# Patient Record
Sex: Male | Born: 1955 | Race: White | Hispanic: No | Smoking: Former smoker
Health system: Southern US, Community
[De-identification: ages and names within clinical notes are randomized; demographics above are authoritative.]

## PROBLEM LIST (undated history)

## (undated) DIAGNOSIS — M1990 Unspecified osteoarthritis, unspecified site: Secondary | ICD-10-CM

## (undated) DIAGNOSIS — F419 Anxiety disorder, unspecified: Secondary | ICD-10-CM

## (undated) DIAGNOSIS — I82409 Acute embolism and thrombosis of unspecified deep veins of unspecified lower extremity: Secondary | ICD-10-CM

## (undated) DIAGNOSIS — J439 Emphysema, unspecified: Secondary | ICD-10-CM

## (undated) DIAGNOSIS — J449 Chronic obstructive pulmonary disease, unspecified: Secondary | ICD-10-CM

## (undated) DIAGNOSIS — N189 Chronic kidney disease, unspecified: Secondary | ICD-10-CM

## (undated) DIAGNOSIS — K219 Gastro-esophageal reflux disease without esophagitis: Secondary | ICD-10-CM

## (undated) DIAGNOSIS — IMO0001 Reserved for inherently not codable concepts without codable children: Secondary | ICD-10-CM

## (undated) DIAGNOSIS — G4730 Sleep apnea, unspecified: Secondary | ICD-10-CM

## (undated) DIAGNOSIS — I509 Heart failure, unspecified: Secondary | ICD-10-CM

## (undated) DIAGNOSIS — F32A Depression, unspecified: Secondary | ICD-10-CM

## (undated) DIAGNOSIS — R569 Unspecified convulsions: Secondary | ICD-10-CM

## (undated) DIAGNOSIS — J45909 Unspecified asthma, uncomplicated: Secondary | ICD-10-CM

## (undated) DIAGNOSIS — M542 Cervicalgia: Secondary | ICD-10-CM

## (undated) DIAGNOSIS — M810 Age-related osteoporosis without current pathological fracture: Secondary | ICD-10-CM

## (undated) DIAGNOSIS — M5442 Lumbago with sciatica, left side: Secondary | ICD-10-CM

## (undated) DIAGNOSIS — F329 Major depressive disorder, single episode, unspecified: Secondary | ICD-10-CM

## (undated) DIAGNOSIS — Z87442 Personal history of urinary calculi: Secondary | ICD-10-CM

## (undated) DIAGNOSIS — I10 Essential (primary) hypertension: Secondary | ICD-10-CM

## (undated) DIAGNOSIS — G709 Myoneural disorder, unspecified: Secondary | ICD-10-CM

## (undated) HISTORY — PX: SPINE SURGERY: SHX786

## (undated) HISTORY — PX: FRACTURE SURGERY: SHX138

## (undated) HISTORY — PX: OTHER SURGICAL HISTORY: SHX169

## (undated) HISTORY — DX: Cervicalgia: M54.2

## (undated) HISTORY — DX: Acute embolism and thrombosis of unspecified deep veins of unspecified lower extremity: I82.409

## (undated) HISTORY — DX: Emphysema, unspecified: J43.9

## (undated) HISTORY — DX: Heart failure, unspecified: I50.9

## (undated) HISTORY — PX: NECK SURGERY: SHX720

## (undated) HISTORY — PX: JOINT REPLACEMENT: SHX530

## (undated) HISTORY — DX: Age-related osteoporosis without current pathological fracture: M81.0

## (undated) HISTORY — DX: Lumbago with sciatica, left side: M54.42

## (undated) HISTORY — DX: Chronic obstructive pulmonary disease, unspecified: J44.9

## (undated) HISTORY — DX: Unspecified asthma, uncomplicated: J45.909

---

## 2003-03-06 ENCOUNTER — Inpatient Hospital Stay (HOSPITAL_COMMUNITY): Admission: EM | Admit: 2003-03-06 | Discharge: 2003-03-09 | Payer: Self-pay | Admitting: Emergency Medicine

## 2003-06-01 ENCOUNTER — Inpatient Hospital Stay (HOSPITAL_COMMUNITY): Admission: RE | Admit: 2003-06-01 | Discharge: 2003-06-03 | Payer: Self-pay | Admitting: Specialist

## 2003-06-22 ENCOUNTER — Inpatient Hospital Stay (HOSPITAL_COMMUNITY): Admission: AD | Admit: 2003-06-22 | Discharge: 2003-06-25 | Payer: Self-pay | Admitting: Specialist

## 2003-08-29 ENCOUNTER — Encounter: Admission: RE | Admit: 2003-08-29 | Discharge: 2003-08-29 | Payer: Self-pay | Admitting: Infectious Diseases

## 2003-10-03 ENCOUNTER — Encounter: Admission: RE | Admit: 2003-10-03 | Discharge: 2003-10-03 | Payer: Self-pay | Admitting: Infectious Diseases

## 2003-11-19 ENCOUNTER — Ambulatory Visit (HOSPITAL_COMMUNITY): Admission: RE | Admit: 2003-11-19 | Discharge: 2003-11-19 | Payer: Self-pay | Admitting: Specialist

## 2003-12-12 ENCOUNTER — Encounter: Admission: RE | Admit: 2003-12-12 | Discharge: 2003-12-12 | Payer: Self-pay | Admitting: Infectious Diseases

## 2004-04-16 ENCOUNTER — Encounter: Admission: RE | Admit: 2004-04-16 | Discharge: 2004-04-16 | Payer: Self-pay | Admitting: Specialist

## 2007-05-02 ENCOUNTER — Ambulatory Visit (HOSPITAL_COMMUNITY): Admission: RE | Admit: 2007-05-02 | Discharge: 2007-05-02 | Payer: Self-pay | Admitting: *Deleted

## 2007-05-02 ENCOUNTER — Encounter (INDEPENDENT_AMBULATORY_CARE_PROVIDER_SITE_OTHER): Payer: Self-pay | Admitting: *Deleted

## 2008-07-09 ENCOUNTER — Ambulatory Visit (HOSPITAL_COMMUNITY): Admission: RE | Admit: 2008-07-09 | Discharge: 2008-07-09 | Payer: Self-pay | Admitting: *Deleted

## 2008-07-09 ENCOUNTER — Encounter (INDEPENDENT_AMBULATORY_CARE_PROVIDER_SITE_OTHER): Payer: Self-pay | Admitting: *Deleted

## 2010-09-09 NOTE — Op Note (Signed)
NAME:  Evan [name], Evan [name]               ACCOUNT NO.:  [id number]   MEDICAL RECORD NO.:  [id number]          PATIENT TYPE:  AMB   LOCATION:  ENDO                         FACILITY:  [facility]   PHYSICIAN:  Georgiana Spinner, M.D.    DATE OF BIRTH:  05/30/1955   DATE OF PROCEDURE:  05/02/2007  DATE OF DISCHARGE:                               OPERATIVE REPORT   PROCEDURE:  Upper endoscopy.   INDICATIONS:  GERD.   ANESTHESIA:  Fentanyl 75 mcg, Versed 8 mg.   PROCEDURE:  With the patient mildly sedated in the left lateral  decubitus position, the Pentax videoscopic endoscope was inserted in the  mouth and passed under direct vision through the esophagus which  appeared normal until we reached distal esophagus and he had the  beginning of a stricture and inflammatory changes probably related to  esophageal reflux.  They were photographed and biopsied.  We then  entered into the stomach.  Fundus, body, antrum appeared normal.  Duodenal bulb showed multiple small ulcerations, however, which were  photographed.  The second portion of the duodenum appeared normal.  From  this point the endoscope was slowly withdrawn taking circumferential  views of the duodenal mucosa until the endoscope had been pulled back in  the stomach, placed in retroflexion to view the stomach from below.  The  endoscope was then straightened and withdrawn taking circumferential  views of the remaining gastric and esophageal mucosa, stopping to biopsy  the antral area to rule out H. pylori.  The endoscope was withdrawn.  The patient's vital signs and pulse oximeter remained stable.  The  patient tolerated the procedure well without apparent complications.   FINDINGS:  Changes of esophagitis secondary to reflux.  Ulceration of  duodenum.  Biopsies taken to rule out H. pylori.   PLAN:  The patient needs to be on PPI if he is not already on one at  least once a day.  Will have the patient call me for results of biopsy  and follow  up with me as an outpatient.           ______________________________  Georgiana Spinner, M.D.     GMO/MEDQ  D:  05/02/2007  T:  05/02/2007  Job:  [id number]

## 2010-09-09 NOTE — Op Note (Signed)
NAME:  Evan [name], Evan [name]               ACCOUNT NO.:  [id number]   MEDICAL RECORD NO.:  [id number]          PATIENT TYPE:  AMB   LOCATION:  ENDO                         [facility]ACILITY:  [facility]   PHYSICIAN:  Georgiana Spinner, M.D.    DATE OF BIRTH:  [birth year]   DATE OF PROCEDURE:  07/09/2008  DATE OF DISCHARGE:                               OPERATIVE REPORT   PROCEDURE:  Upper endoscopy.   INDICATIONS:  Barrett's esophagus.   ANESTHESIA:  Fentanyl 75 mcg, Versed 5 mg.   DESCRIPTION OF PROCEDURE:  With the patient mildly sedated in the left  lateral decubitus position, the Pentax videoscopic endoscope was  inserted in the mouth and passed under direct vision through the  esophagus which appeared normal until we reached distal esophagus, and  there were changes that appeared to be Barrett's esophagus at the  squamocolumnar junction.  Photographs were taken and biopsies were  taken.  We entered into the stomach, fundus, body, and antrum.  The  duodenal bulb and second portion duodenum were visualized.  From this  point the endoscope was slowly withdrawn, taking circumferential views  of the duodenal mucosa until the endoscope had been pulled back into the  stomach, placed in retroflexion to view the stomach from below.  The  endoscope was straightened and withdrawn, taking circumferential views  of the remaining gastric and esophageal mucosa.  The patient's vital  signs and pulse oximeter remained stable.  The patient tolerated the  procedure well without apparent complications.   FINDINGS:  The patient was gagging at the end of the procedure so we  could not do any further biopsies, but what appeared to be Barrett's  esophagus was biopsied.  The patient had very lax lower esophageal  sphincter with ability to see up the esophagus on retroflexed view,  indicating laxity of the sphincter.   PLAN:  The patient will call me for results of biopsy and follow up with  me as an outpatient.      ______________________________  Georgiana Spinner, M.D.     GMO/MEDQ  D:  07/09/2008  T:  07/09/2008  Job:  [id number]
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
## 2010-09-12 NOTE — Discharge Summary (Signed)
NAME:  Evan Evan [name], Evan Evan [name]                         ACCOUNT NO.:  [id number]   MEDICAL RECORD NO.:  [id number]                   PATIENT TYPE:  INP   LOCATION:  5030                                 FACILITY:  [facility]   PHYSICIAN:  Kerrin Champagne, M.D.                DATE OF BIRTH:  April 02, 1956   DATE OF ADMISSION:  03/06/2003  DATE OF DISCHARGE:  03/09/2003                                 DISCHARGE SUMMARY   ADMISSION DIAGNOSES:  1. Left tibia/fibular fracture and proximal fibular fracture.  2. Left chronic hemiparesis, secondary to previous cervical spine injury.  3. Hypertension.  4. Benign prostatic hypertrophy.  5. Gastroesophageal reflux disease.   DISCHARGE DIAGNOSES:  1. Left tibia/fibular fracture and proximal fibular fracture.  2. Left chronic hemiparesis, secondary to previous cervical spine injury.  3. Hypertension.  4. Benign prostatic hypertrophy.  5. Gastroesophageal reflux disease.   PROCEDURE:  On March 06, 2003, the patient underwent closed reduction and  casting of the left tibia/fibular fracture as well as the left proximal  fibular fracture.  This was done under IV sedation.   CONSULTATIONS:  None.   BRIEF HISTORY:  The patient is Evan [name] who fell from Evan [name]  riding lawnmower and was struck by the lawnmower, injuring his left lower  extremity.  He was brought to [facility] Room where x-rays  revealed Evan [name] left tib/fib fracture, closed, as well as Evan [name] left proximal fibular  fracture.  He underwent closed reduction and casting in the emergency room  under local sedation.  Unfortunately, secondary to his left hemiparesis, he  required admission to the hospital for physical therapy as well as pain  control issues.   BRIEF HOSPITAL COURSE:  Upon admission, the patient utilized PCA analgesics  for his discomfort.  He had Evan [name] significant amount of nausea and did require  IV antiemetics.  Eventually, as his nausea was more controlled, his  medications  were changed to OxyContin and OxyIR.  He continued IV fluids  until the nausea resolved.  Physical Therapy assisted the patient with bed-  to-chair transfers.  It was felt that he would require bed-to-wheelchair  transfers only as he was unable to use the left upper extremity and will be  strict nonweightbearing on the left lower extremity.  Unfortunately, on the  following couple of days during the hospital stay, he continued to have  episodes of nausea and vomiting.  Eventually, he was able to have Evan [name] bowel  movement and this did help to decrease his nausea.  He was placed on  Prevacid for his reflux as well.  The patient was treated with handheld  nebulizers and his spirometry per mild fever postoperatively, which was felt  to be related to atelectasis.   During the hospital stay, he was able to learn bed-to-wheelchair transfers.  On March 09, 2003, his nausea had resolved.  The patient was having bowel  movements, was voiding well and was comfortable with oral analgesics.  He  was discharged to his home in stable condition.   LABORATORY DATA/STUDIES:  Pertinent laboratory values:  KUB on March 08, 2003, showed normal gas pattern.  Left lower extremity on November 9, showed  postreduction left tib/fib fracture.  Chest x-Lorie on March 06, 2003 showed  cardiomegaly with no rib fracture.  No other labs were done during the  hospital stay.   PLAN:  The patient was discharged to his home.  Arrangements were made for  home health physical therapy as well as occupational therapy.  He is bed-to-  wheelchair transfers only.  All equipment was made available as well.  This  would include Evan [name] hospital bed, wheelchair, bed commode and walker.   DISCHARGE MEDICATIONS:  Prescriptions were given for:  1. OxyContin 20 mg b.i.d.  2. OxyIR 1 q.4-6h. as needed for pain.  3. Robaxin 500 mg 1 q.8h. as needed for spasm.  4. Reglan 10 mg to take q.6h. p.r.n. nausea.  5. Aspirin over-the-counter and  use 325 mg daily.  6. Over-the-counter stool softener and laxatives as needed.   DISCHARGE INSTRUCTIONS:  He will continue with strict ice and elevation of  the left lower extremity.  He will followup with Kerrin Champagne, M.D. two  weeks from the date of his surgery.  If there are questions or concerns  prior to that time, he has been advised to call the office.   DISCHARGE CONDITION:  Stable.      [name], P.Evan [name].                    Kerrin Champagne, M.D.    SMV/MEDQ  D:  03/29/2003  T:  03/29/2003  Job:  [id number]

## 2012-02-04 ENCOUNTER — Other Ambulatory Visit: Payer: Self-pay | Admitting: Gastroenterology

## 2013-09-20 ENCOUNTER — Encounter (INDEPENDENT_AMBULATORY_CARE_PROVIDER_SITE_OTHER): Payer: Self-pay

## 2013-09-20 ENCOUNTER — Encounter: Payer: Self-pay | Admitting: Neurology

## 2013-09-20 ENCOUNTER — Ambulatory Visit (INDEPENDENT_AMBULATORY_CARE_PROVIDER_SITE_OTHER): Payer: Medicaid Other | Admitting: Neurology

## 2013-09-20 VITALS — BP 130/77 | HR 71 | Ht 69.0 in | Wt 176.0 lb

## 2013-09-20 DIAGNOSIS — M545 Low back pain, unspecified: Secondary | ICD-10-CM

## 2013-09-20 DIAGNOSIS — M542 Cervicalgia: Secondary | ICD-10-CM | POA: Insufficient documentation

## 2013-09-20 DIAGNOSIS — R404 Transient alteration of awareness: Secondary | ICD-10-CM

## 2013-09-20 MED ORDER — DULOXETINE HCL 60 MG PO CPEP: 60.0000 mg | ORAL_CAPSULE | Freq: Every day | ORAL | Status: DC

## 2013-09-20 MED ORDER — GABAPENTIN 300 MG PO CAPS: 300.0000 mg | ORAL_CAPSULE | Freq: Three times a day (TID) | ORAL | Status: DC
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
## 2013-09-28 ENCOUNTER — Ambulatory Visit
Admission: RE | Admit: 2013-09-28 | Discharge: 2013-09-28 | Disposition: A | Discharge status: Home / Self Care | Payer: Medicaid Other | Source: Ambulatory Visit | Attending: Neurology | Admitting: Neurology

## 2013-09-28 ENCOUNTER — Other Ambulatory Visit: Payer: Self-pay

## 2013-09-28 DIAGNOSIS — M542 Cervicalgia: Secondary | ICD-10-CM

## 2013-09-28 DIAGNOSIS — M545 Low back pain, unspecified: Secondary | ICD-10-CM

## 2013-09-28 DIAGNOSIS — R55 Syncope and collapse: Secondary | ICD-10-CM

## 2013-09-28 DIAGNOSIS — R404 Transient alteration of awareness: Secondary | ICD-10-CM

## 2013-09-28 MED ORDER — GADOBENATE DIMEGLUMINE 529 MG/ML IV SOLN
16.0000 mL | Freq: Once | INTRAVENOUS | Status: AC | PRN
  Administered 2013-09-28: 16 mL via INTRAVENOUS

## 2013-09-29 ENCOUNTER — Telehealth: Payer: Self-pay | Admitting: *Deleted

## 2013-09-29 ENCOUNTER — Ambulatory Visit (INDEPENDENT_AMBULATORY_CARE_PROVIDER_SITE_OTHER): Payer: Medicaid Other | Admitting: Radiology

## 2013-09-29 DIAGNOSIS — M545 Low back pain, unspecified: Secondary | ICD-10-CM

## 2013-09-29 DIAGNOSIS — M542 Cervicalgia: Secondary | ICD-10-CM

## 2013-09-29 DIAGNOSIS — R404 Transient alteration of awareness: Secondary | ICD-10-CM

## 2013-09-29 NOTE — Telephone Encounter (Signed)
Will address his questions in June 30 follow up
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
## 2013-10-02 ENCOUNTER — Telehealth: Payer: Self-pay | Admitting: Neurology

## 2013-10-02 NOTE — Telephone Encounter (Addendum)
Evan [name], please call patient, mild age related changes on MRI of brain. Degenerative disc disease on MRI cervical, will go over detail at follow up visit.

## 2013-10-05 NOTE — Telephone Encounter (Signed)
Spoke to patient and relayed MRI brain and cervical results, per Dr. Krista Blue.  Told him the doctor will go over in detail at his follow up visit.  Patient also would like a copy of results, I explained that at his appointment 10-24-13 to sign a release and we can get copies for him.

## 2013-10-13 ENCOUNTER — Telehealth: Payer: Self-pay | Admitting: Neurology

## 2013-10-13 DIAGNOSIS — M542 Cervicalgia: Secondary | ICD-10-CM

## 2013-10-13 DIAGNOSIS — M545 Low back pain, unspecified: Secondary | ICD-10-CM

## 2013-10-13 DIAGNOSIS — R404 Transient alteration of awareness: Secondary | ICD-10-CM

## 2013-10-13 NOTE — Telephone Encounter (Signed)
Patient's girlfriend called and stated MRI of back was denied by [facility].  Questioning if patient could have back X-Merrick.  Please call and advise
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
## 2013-10-17 NOTE — Telephone Encounter (Signed)
Spoke with patient's girlfriend and told her that Dr Krista Blue had placed the orders for the DG Lumbar, DG Cervical were placed on 06/22,she verbalized understanding

## 2013-10-18 ENCOUNTER — Ambulatory Visit
Admission: RE | Admit: 2013-10-18 | Discharge: 2013-10-18 | Disposition: A | Discharge status: Home / Self Care | Payer: Medicaid Other | Source: Ambulatory Visit | Attending: Neurology | Admitting: Neurology

## 2013-10-18 DIAGNOSIS — R404 Transient alteration of awareness: Secondary | ICD-10-CM

## 2013-10-18 DIAGNOSIS — M545 Low back pain, unspecified: Secondary | ICD-10-CM

## 2013-10-18 DIAGNOSIS — M542 Cervicalgia: Secondary | ICD-10-CM

## 2013-10-20 ENCOUNTER — Telehealth: Payer: Self-pay | Admitting: Neurology

## 2013-10-20 NOTE — Telephone Encounter (Addendum)
I will go over result at follow up visit  X-Evan [name] of cervical showed  Negative for fracture or other acute bone abnormality. Solid interbody fusion C4-C6 with degenerative disc disease at other cervical levels as above.  Xray lumbar Mild lumbar spine spondylosis at L4-5 and L5-S1.

## 2013-10-23 NOTE — Telephone Encounter (Signed)
Left message with Xray lumbar showing mild lumbar spine spondylosis at L4-5 and L5-S1, per Dr. Krista Blue.  Relayed that she will go over these results tomorrow at his appointment.

## 2013-10-24 ENCOUNTER — Encounter (INDEPENDENT_AMBULATORY_CARE_PROVIDER_SITE_OTHER): Payer: Self-pay

## 2013-10-24 ENCOUNTER — Encounter: Payer: Self-pay | Admitting: Neurology

## 2013-10-24 ENCOUNTER — Ambulatory Visit (INDEPENDENT_AMBULATORY_CARE_PROVIDER_SITE_OTHER): Payer: Medicaid Other | Admitting: Neurology

## 2013-10-24 VITALS — BP 126/80 | HR 67 | Ht 69.0 in | Wt 180.0 lb

## 2013-10-24 DIAGNOSIS — M542 Cervicalgia: Secondary | ICD-10-CM

## 2013-10-24 DIAGNOSIS — M545 Low back pain, unspecified: Secondary | ICD-10-CM

## 2013-10-24 MED ORDER — GABAPENTIN 300 MG PO CAPS
600.0000 mg | ORAL_CAPSULE | Freq: Three times a day (TID) | ORAL | Status: DC
Start: 2013-10-24 — End: 2014-11-27
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
## 2013-11-02 ENCOUNTER — Ambulatory Visit
Admission: RE | Admit: 2013-11-02 | Discharge: 2013-11-02 | Disposition: A | Discharge status: Home / Self Care | Payer: Medicaid Other | Source: Ambulatory Visit | Attending: Neurology | Admitting: Neurology

## 2013-11-02 DIAGNOSIS — M545 Low back pain, unspecified: Secondary | ICD-10-CM

## 2013-11-02 DIAGNOSIS — M542 Cervicalgia: Secondary | ICD-10-CM

## 2013-11-02 DIAGNOSIS — M549 Dorsalgia, unspecified: Secondary | ICD-10-CM

## 2013-11-03 ENCOUNTER — Telehealth: Payer: Self-pay | Admitting: Neurology

## 2013-11-03 DIAGNOSIS — M545 Low back pain, unspecified: Secondary | ICD-10-CM

## 2013-11-03 NOTE — Telephone Encounter (Signed)
I have called him  MRI of the lumbar spine showing prominent spondylitic changes at L4-5 and there is left foraminal disc protrusion resulting in foraminal narrowing as well as right lateral disc osteophyte protrusion and L5-S1 resulting in moderate foraminal narrowing as well.  EMG/NCS is ordered.

## 2013-11-28 ENCOUNTER — Ambulatory Visit (INDEPENDENT_AMBULATORY_CARE_PROVIDER_SITE_OTHER): Payer: Medicaid Other | Admitting: Neurology

## 2013-11-28 ENCOUNTER — Encounter (INDEPENDENT_AMBULATORY_CARE_PROVIDER_SITE_OTHER): Payer: Self-pay

## 2013-11-28 DIAGNOSIS — Z0289 Encounter for other administrative examinations: Secondary | ICD-10-CM

## 2013-11-28 DIAGNOSIS — M545 Low back pain, unspecified: Secondary | ICD-10-CM

## 2013-11-28 NOTE — Addendum Note (Signed)
Addended by: Marcial Pacas on: 11/28/2013 12:34 PM   Modules accepted: Level of Service
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
## 2014-01-24 ENCOUNTER — Encounter (INDEPENDENT_AMBULATORY_CARE_PROVIDER_SITE_OTHER): Payer: Self-pay

## 2014-01-24 ENCOUNTER — Ambulatory Visit (INDEPENDENT_AMBULATORY_CARE_PROVIDER_SITE_OTHER): Payer: Medicaid Other | Admitting: Neurology

## 2014-01-24 ENCOUNTER — Encounter: Payer: Self-pay | Admitting: Neurology

## 2014-01-24 VITALS — BP 142/91 | HR 69 | Ht 69.0 in | Wt 192.0 lb

## 2014-01-24 DIAGNOSIS — M5442 Lumbago with sciatica, left side: Secondary | ICD-10-CM | POA: Insufficient documentation

## 2014-01-24 DIAGNOSIS — M5430 Sciatica, unspecified side: Secondary | ICD-10-CM
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
## 2014-07-03 ENCOUNTER — Telehealth: Payer: Self-pay | Admitting: Neurology

## 2014-07-03 NOTE — Telephone Encounter (Signed)
Low back pain has worsened - radiating down right leg into heel.  He is rating pain 9-10 on pain scale. Using hydrocodone daily.

## 2014-07-03 NOTE — Telephone Encounter (Signed)
Pt is calling stating he has some new symptoms of pain going from his back to down his leg and it's become unbearable. Not sure what he needs to do. Please call and advise.

## 2014-07-03 NOTE — Telephone Encounter (Signed)
Called and spoke to patient and he is coming to see Megan 07-04-14.

## 2014-07-03 NOTE — Telephone Encounter (Signed)
Ok per Dr. Krista Blue and Jinny Blossom to be placed on schedule.  Hinton Dyer will call to schedule.

## 2014-07-04 ENCOUNTER — Ambulatory Visit (INDEPENDENT_AMBULATORY_CARE_PROVIDER_SITE_OTHER): Payer: Medicaid Other | Admitting: Adult Health

## 2014-07-04 ENCOUNTER — Encounter: Payer: Self-pay | Admitting: Adult Health

## 2014-07-04 VITALS — BP 159/96 | HR 58 | Ht 69.0 in | Wt 202.0 lb

## 2014-07-04 DIAGNOSIS — F329 Major depressive disorder, single episode, unspecified: Secondary | ICD-10-CM | POA: Diagnosis not present

## 2014-07-04 DIAGNOSIS — F32A Depression, unspecified: Secondary | ICD-10-CM

## 2014-07-04 DIAGNOSIS — M5441 Lumbago with sciatica, right side: Secondary | ICD-10-CM | POA: Diagnosis not present

## 2014-07-04 MED ORDER — CELECOXIB 50 MG PO CAPS: 50.0000 mg | ORAL_CAPSULE | Freq: Two times a day (BID) | ORAL | Status: DC
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
## 2014-07-04 NOTE — Patient Instructions (Signed)
Start Celebrex 50 mg twice a day I will refer you to pain clinc.

## 2014-07-05 NOTE — Progress Notes (Signed)
I have reviewed and agreed above plan. 

## 2014-07-09 ENCOUNTER — Ambulatory Visit: Payer: Medicaid Other | Admitting: Neurology

## 2014-11-09 ENCOUNTER — Ambulatory Visit: Payer: Medicaid Other | Admitting: Adult Health

## 2014-11-12 ENCOUNTER — Encounter: Payer: Self-pay | Admitting: Adult Health

## 2014-11-12 ENCOUNTER — Ambulatory Visit (INDEPENDENT_AMBULATORY_CARE_PROVIDER_SITE_OTHER): Payer: Medicaid Other | Admitting: Adult Health

## 2014-11-12 VITALS — BP 167/100 | HR 65 | Ht 69.0 in | Wt 205.0 lb

## 2014-11-12 DIAGNOSIS — G8929 Other chronic pain: Secondary | ICD-10-CM

## 2014-11-12 DIAGNOSIS — M549 Dorsalgia, unspecified: Secondary | ICD-10-CM | POA: Diagnosis not present

## 2014-11-12 NOTE — Progress Notes (Signed)
PATIENT: Evan [name] DOB: [birth year]  REASON FOR VISIT: follow up- chronic back pain HISTORY FROM: patient  HISTORY OF PRESENT ILLNESS: Evan [name] is a 59 year old male with a history of right-sided back pain radiating down the right leg. He returns today for follow-up. The patient states that he was on Celebrex and Dr. Ace Gins changed him to indomethacin. He states that his primary care then took him off indomethacin because his blood pressure had increased. He states that Dr. Ace Gins has done epidural steroid injections. He states that this has given him the most relief. He states after the injections he was able to walk and complete yardwork with minimal pain. He states that since he's been taken off indomethacin he's noticed increased pain in the joints however he continues to have minimal pain in the back that radiates to the leg. He states that he has the most trouble with going from a sitting to standing position or climbing up in his truck. He states that last week he used a cane in order to prevent falls. He is not using a cane today. He denies any new neurological issues. He returns today for follow-up.  HISTORY 07/04/14: Evan [name] is a 59 year old male with a history of right-sided back pain that radiates down the leg. He returns today complaining of severe back pain. He describes his pain as a burning tingling sensation that travels down the right leg. He also describes it as a "hot grease" sensation. He said the cold weather will trigger his back pain. He denies any changes with his ambulation however he states that he does walk with a limp due to the pain in the leg. The patient has had ongoing falls since his initial injury. The patient has not been in physical therapy for several years due to finances. The patient denies any changes with his bowels or bladder. He does report some occasional constipation. The patient uses gabapentin 600 mg twice a day. He also was prescribed  hydrocodone by Dr. Rex Kras and reports that this helps with the pain some. 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 He states that he normally takes a chair with him and tries to take frequent breaks. He states that this week he was working on a chicken pin and afterwards her to experience numbness in the fingertips on the left hand. Patient has had ongoing issues with pain and sensory changes in the left arm after his injury. The patient is experiencing some depression. He states that he is in the process for filing for disability but has been denied in the past. He states he is on a very limited income and is unable to work full-time due to his injuries. He states that this causes added stress and depression. He also states that being in pain and see in himself decline over the years also causes depression. He is currently on Prozac. He returns today for an evaluation.  REVIEW OF SYSTEMS: Out of a complete 14 system review of symptoms, the patient complains only of the following symptoms, and all other reviewed systems are negative.  Fatigue, weakness  ALLERGIES: Allergies  Allergen Reactions  . Morphine And Related Nausea And Vomiting    HOME MEDICATIONS: Outpatient Prescriptions Prior to Visit  Medication Sig Dispense Refill  . ALPRAZolam (XANAX) 0.5 MG tablet Take 0.5 mg by mouth at bedtime as needed for anxiety.    Marland Kitchen esomeprazole (NEXIUM) 40 MG capsule Take 40 mg by mouth daily at 12 noon.    Marland Kitchen  FLUoxetine (PROZAC) 20 MG capsule Take 20 mg by mouth daily.    Marland Kitchen gabapentin (NEURONTIN) 300 MG capsule Take 2 capsules (600 mg total) by mouth 3 (three) times daily. 180 capsule 12  . HYDROcodone-acetaminophen (NORCO/VICODIN) 5-325 MG per tablet Take 1 tablet by mouth every 6 (six) hours as needed for moderate pain.    Marland Kitchen amLODipine (NORVASC) 5 MG tablet Take 5 mg by mouth daily.    . celecoxib (CELEBREX) 50 MG capsule Take 1 capsule (50 mg total) by mouth 2 (two) times daily. 60 capsule 0    . SULINDAC PO Take by mouth at bedtime as needed and may repeat dose one time if needed.     No facility-administered medications prior to visit.    PAST MEDICAL HISTORY: Past Medical History  Diagnosis Date  . Neck pain   . Right-sided low back pain with left-sided sciatica     PAST SURGICAL HISTORY: Past Surgical History  Procedure Laterality Date  . Neck surgery      4-5--6 and 7  . Left leg    . Kidney stones      FAMILY HISTORY: Family History  Problem Relation Age of Onset  . Hepatitis Mother   . Alzheimer's disease Father     SOCIAL HISTORY: History   Social History  . Marital Status: Married    Spouse Name: N/A  . Number of Children: 2  . Years of Education: 12   Occupational History  . Not on file.   Social History Main Topics  . Smoking status: Current Every Day Smoker  . Smokeless tobacco: Never Used  . Alcohol Use: 0.6 oz/week    1 Shots of liquor per week     Comment: One daily   . Drug Use: No  . Sexual Activity: Not on file   Other Topics Concern  . Not on file   Social History Narrative   Patient is not working he is trying to get disability   Patient is single    Education 12 th grade    Right handed.   Caffeine three cups daily.                  PHYSICAL EXAM  Filed Vitals:   11/12/14 1544  BP: 167/100  Pulse: 65  Height: 5\' 9"  (1.753 m)  Weight: 205 lb (92.987 kg)   Body mass index is 30.26 kg/(m^2).  Generalized: Well developed, in no acute distress   Neurological examination  Mentation: Alert oriented to time, place, history taking. Follows all commands speech and language fluent Cranial nerve II-XII: Pupils were equal round reactive to light. Extraocular movements were full, visual field were full on confrontational test. Facial sensation and strength were normal. Uvula tongue midline. Head turning and shoulder shrug  were normal and symmetric. Motor: The motor testing reveals 5 over 5 strength in the right  upper and lower extremity. 4 over 5 strength in the left upper and lower extremity. This is due to a prior injury. Good symmetric motor tone is noted throughout.  Sensory: Sensory testing is intact to soft touch on all 4 extremities. No evidence of extinction is noted.  Coordination: Cerebellar testing reveals good finger-nose-finger and heel-to-shin bilaterally.  Gait and station: Patient has a slight limp on the left due to a prior injury. Tandem gait is slightly unsteady. Romberg is negative. No drift is seen.  Reflexes: Deep tendon reflexes are symmetric and normal in the upper extremities. Hyperreflexic in the left lower  extremity.  DIAGNOSTIC DATA (LABS, IMAGING, TESTING) - I reviewed patient records, labs, notes, testing and imaging myself where available.     ASSESSMENT AND PLAN 59 y.o. year old male  has a past medical history of Neck pain and Right-sided low back pain with left-sided sciatica. here with:  1. Chronic back pain  The patient's back pain has improved significantly with epidural steroid injections. He was recently taken off of indomethacin and has noticed an increase in joint related pains. Patient is unsure when his next appointment with Dr. Ace Gins is. I have encouraged the patient to follow-up with Dr. Ace Gins in regards to his pain. Patient verbalized understanding. If his symptoms worsen or he develops new symptoms he should let us know. Otherwise he will follow-up in 6 months with Dr. Krista Blue.      Ward Givens, MSN, NP-C 11/12/2014, 4:22 PM [facility] [address] [place], [place] [address] [phone]  Note: This document was prepared with digital dictation and possible smart phrase technology. Any transcriptional errors that result from this process are unintentional.

## 2014-11-12 NOTE — Patient Instructions (Signed)
Follow-up with Dr. Ace Gins

## 2014-11-12 NOTE — Progress Notes (Signed)
I have read the note, and I agree with the clinical assessment and plan.  Amish Mintzer KEITH   

## 2014-11-27 ENCOUNTER — Other Ambulatory Visit: Payer: Self-pay | Admitting: Neurology

## 2014-11-27 NOTE — Telephone Encounter (Signed)
OV note says: He will continue taking gabapentin 600 mg twice a day

## 2015-01-01 ENCOUNTER — Other Ambulatory Visit: Payer: Self-pay | Admitting: Neurology

## 2015-03-01 DIAGNOSIS — M5136 Other intervertebral disc degeneration, lumbar region: Secondary | ICD-10-CM | POA: Insufficient documentation

## 2015-03-01 DIAGNOSIS — M51369 Other intervertebral disc degeneration, lumbar region without mention of lumbar back pain or lower extremity pain: Secondary | ICD-10-CM | POA: Insufficient documentation

## 2015-03-01 DIAGNOSIS — M47816 Spondylosis without myelopathy or radiculopathy, lumbar region: Secondary | ICD-10-CM | POA: Insufficient documentation

## 2015-04-25 ENCOUNTER — Ambulatory Visit (INDEPENDENT_AMBULATORY_CARE_PROVIDER_SITE_OTHER): Payer: Self-pay | Admitting: Neurology

## 2015-04-25 ENCOUNTER — Ambulatory Visit (INDEPENDENT_AMBULATORY_CARE_PROVIDER_SITE_OTHER): Payer: Medicaid Other | Admitting: Neurology

## 2015-04-25 DIAGNOSIS — M5442 Lumbago with sciatica, left side: Secondary | ICD-10-CM

## 2015-04-25 DIAGNOSIS — M545 Low back pain, unspecified: Secondary | ICD-10-CM

## 2015-04-25 DIAGNOSIS — Z0289 Encounter for other administrative examinations: Secondary | ICD-10-CM

## 2015-04-25 NOTE — Procedures (Signed)
   NCS (NERVE CONDUCTION STUDY) WITH EMG (ELECTROMYOGRAPHY) REPORT   STUDY DATE: April 25 2015 PATIENT NAME: Evan [name] DOB: 1955-09-07 MRN: [id number]    TECHNOLOGIST: Laretta Alstrom ELECTROMYOGRAPHER: Marcial Pacas M.D.  CLINICAL INFORMATION:  59 years old gentleman with chronic low back pain, radiating pain to right lower extremity  FINDINGS: NERVE CONDUCTION STUDY: Bilateral sural sensory responses were normal. Bilateral peroneal to EDB and tibial motor responses were normal. Bilateral tibial H reflexes were normal and symmetric.  NEEDLE ELECTROMYOGRAPHY: Selected needle examination was performed at right lower extremity muscles right lumbosacral paraspinal muscles.  Needle examination of right tibialis anterior, tibialis posterior, peroneal longus, vastus lateralis, gluteus medius was normal.  There was no spontaneous activity at right lumbosacral paraspinal muscles, right L4-5 S1.  IMPRESSION:   This is a normal study. There is no electrodiagnostic evidence of large fiber peripheral neuropathy or right lumbosacral radiculopathy.  INTERPRETING PHYSICIAN:   Marcial Pacas M.D. Ph.D. [facility] [address] [place], [place] [address] [phone]

## 2015-04-25 NOTE — Progress Notes (Signed)
Electrodiagnostic study is normal. There is no electrodiagnostic evidence of large fiber peripheral neuropathy or right lumbosacral radiculopathy.

## 2015-05-03 ENCOUNTER — Ambulatory Visit
Admission: RE | Admit: 2015-05-03 | Discharge: 2015-05-03 | Disposition: A | Discharge status: Home / Self Care | Payer: Medicaid Other | Source: Ambulatory Visit | Attending: Physical Medicine and Rehabilitation | Admitting: Physical Medicine and Rehabilitation

## 2015-05-03 ENCOUNTER — Other Ambulatory Visit: Payer: Self-pay | Admitting: Physical Medicine and Rehabilitation

## 2015-05-03 DIAGNOSIS — G894 Chronic pain syndrome: Secondary | ICD-10-CM

## 2015-05-03 DIAGNOSIS — M160 Bilateral primary osteoarthritis of hip: Secondary | ICD-10-CM

## 2015-05-07 ENCOUNTER — Other Ambulatory Visit: Payer: Self-pay | Admitting: Physical Medicine and Rehabilitation

## 2015-05-07 DIAGNOSIS — R269 Unspecified abnormalities of gait and mobility: Secondary | ICD-10-CM

## 2015-05-07 DIAGNOSIS — M25551 Pain in right hip: Secondary | ICD-10-CM

## 2015-05-13 ENCOUNTER — Other Ambulatory Visit: Payer: Medicaid Other

## 2015-05-15 ENCOUNTER — Ambulatory Visit: Payer: Medicaid Other | Admitting: Neurology

## 2015-05-16 ENCOUNTER — Ambulatory Visit
Admission: RE | Admit: 2015-05-16 | Discharge: 2015-05-16 | Disposition: A | Discharge status: Home / Self Care | Payer: Medicaid Other | Source: Ambulatory Visit | Attending: Physical Medicine and Rehabilitation | Admitting: Physical Medicine and Rehabilitation

## 2015-05-16 DIAGNOSIS — R269 Unspecified abnormalities of gait and mobility: Secondary | ICD-10-CM

## 2015-05-16 DIAGNOSIS — M25551 Pain in right hip: Secondary | ICD-10-CM

## 2015-07-02 ENCOUNTER — Other Ambulatory Visit: Payer: Self-pay | Admitting: Orthopaedic Surgery

## 2015-07-03 ENCOUNTER — Other Ambulatory Visit: Payer: Self-pay | Admitting: Physician Assistant

## 2015-07-04 ENCOUNTER — Other Ambulatory Visit: Payer: Self-pay | Admitting: Orthopaedic Surgery

## 2015-07-04 NOTE — Pre-Procedure Instructions (Signed)
    Emani A Koogler  07/04/2015      [facility] - [place] - [address] [address] [place] [address] Phone: [phone] Fax: [phone]    Your procedure is scheduled on Tuesday, March 14th, 2017.   Report to [facility] Admitting at 11:15 A.M.   Call this number if you have problems the morning of surgery:  [phone]   Remember:  Do not eat food or drink liquids after midnight.   Take these medicines the morning of surgery with A SIP OF WATER: Alprazolam (Zanax) if needed, Esomeprazole (Nexium), Fluoxetine (Prozac), Gabapentin (Neurontin), Hydrocodone-acetaminophen (Norco/Vicodin), Meclizine if needed, Metoprolol Succinate (Toprol-XL), Prednisone (Deltasone).  Stop taking: Aspirin, NSAIDS, Aleve, Naproxen, Ibuprofen, Advil, Motrin, BC's, Goody's, Fish oil, all herbal medications, and all vitamins.    Do not wear jewelry.  Do not wear lotions, powders, or colognes.    Men may shave face and neck .  Do not bring valuables to the hospital.   [facility] is not responsible for any belongings or valuables.  Contacts, dentures or bridgework may not be worn into surgery.  Leave your suitcase in the car.  After surgery it may be brought to your room.  For patients admitted to the hospital, discharge time will be determined by your treatment team.  Patients discharged the day of surgery will not be allowed to drive home.   Special instructions:  See attached.   Please read over the following fact sheets that you were given. Pain Booklet, Coughing and Deep Breathing, Total Joint Packet, MRSA Information and Surgical Site Infection Prevention

## 2015-07-05 ENCOUNTER — Encounter (HOSPITAL_COMMUNITY)
Admission: RE | Admit: 2015-07-05 | Discharge: 2015-07-05 | Disposition: A | Discharge status: Home / Self Care | Payer: Medicaid Other | Source: Ambulatory Visit | Attending: Orthopaedic Surgery | Admitting: Orthopaedic Surgery

## 2015-07-05 ENCOUNTER — Encounter (HOSPITAL_COMMUNITY): Payer: Self-pay

## 2015-07-05 DIAGNOSIS — Z01812 Encounter for preprocedural laboratory examination: Secondary | ICD-10-CM | POA: Insufficient documentation

## 2015-07-05 DIAGNOSIS — Z0183 Encounter for blood typing: Secondary | ICD-10-CM | POA: Insufficient documentation

## 2015-07-05 DIAGNOSIS — R001 Bradycardia, unspecified: Secondary | ICD-10-CM | POA: Diagnosis not present

## 2015-07-05 DIAGNOSIS — M1611 Unilateral primary osteoarthritis, right hip: Secondary | ICD-10-CM | POA: Diagnosis not present

## 2015-07-05 HISTORY — DX: Gastro-esophageal reflux disease without esophagitis: K21.9

## 2015-07-05 HISTORY — DX: Depression, unspecified: F32.A

## 2015-07-05 HISTORY — DX: Unspecified osteoarthritis, unspecified site: M19.90

## 2015-07-05 HISTORY — DX: Sleep apnea, unspecified: G47.30

## 2015-07-05 HISTORY — DX: Major depressive disorder, single episode, unspecified: F32.9

## 2015-07-05 HISTORY — DX: Reserved for inherently not codable concepts without codable children: IMO0001

## 2015-07-05 HISTORY — DX: Unspecified convulsions: R56.9

## 2015-07-05 HISTORY — DX: Myoneural disorder, unspecified: G70.9

## 2015-07-05 HISTORY — DX: Chronic kidney disease, unspecified: N18.9

## 2015-07-05 HISTORY — DX: Anxiety disorder, unspecified: F41.9

## 2015-07-05 HISTORY — DX: Essential (primary) hypertension: I10

## 2015-07-05 LAB — CBC
HEMATOCRIT: 45.8 % (ref 39.0–52.0)
Hemoglobin: 14.6 g/dL (ref 13.0–17.0)
MCH: 28.2 pg (ref 26.0–34.0)
MCHC: 31.9 g/dL (ref 30.0–36.0)
MCV: 88.4 fL (ref 78.0–100.0)
Platelets: 195 10*3/uL (ref 150–400)
RBC: 5.18 MIL/uL (ref 4.22–5.81)
RDW: 13.8 % (ref 11.5–15.5)
WBC: 7.7 10*3/uL (ref 4.0–10.5)

## 2015-07-05 LAB — SURGICAL PCR SCREEN
MRSA, PCR: NEGATIVE
STAPHYLOCOCCUS AUREUS: NEGATIVE

## 2015-07-05 LAB — BASIC METABOLIC PANEL
Anion gap: 9 (ref 5–15)
BUN: 11 mg/dL (ref 6–20)
CHLORIDE: 106 mmol/L (ref 101–111)
CO2: 25 mmol/L (ref 22–32)
Calcium: 9.3 mg/dL (ref 8.9–10.3)
Creatinine, Ser: 1.17 mg/dL (ref 0.61–1.24)
GFR calc Af Amer: >60 mL/min (ref >60)
GFR calc non Af Amer: >60 mL/min (ref >60)
GLUCOSE: 99 mg/dL (ref 65–99)
POTASSIUM: 4.1 mmol/L (ref 3.5–5.1)
Sodium: 140 mmol/L (ref 135–145)

## 2015-07-08 MED ORDER — CEFAZOLIN SODIUM-DEXTROSE 2-3 GM-% IV SOLR
2.0000 g | INTRAVENOUS | Status: AC
  Administered 2015-07-09: 2 g via INTRAVENOUS
  Filled 2015-07-08: qty 50

## 2015-07-09 ENCOUNTER — Encounter (HOSPITAL_COMMUNITY): Payer: Self-pay | Admitting: Anesthesiology

## 2015-07-09 ENCOUNTER — Inpatient Hospital Stay (HOSPITAL_COMMUNITY): Payer: Medicaid Other

## 2015-07-09 ENCOUNTER — Inpatient Hospital Stay (HOSPITAL_COMMUNITY)
Admission: RE | Admit: 2015-07-09 | Discharge: 2015-07-11 | DRG: 470 | Disposition: A | Discharge status: Home Health | Payer: Medicaid Other | Source: Ambulatory Visit | Attending: Orthopaedic Surgery | Admitting: Orthopaedic Surgery

## 2015-07-09 ENCOUNTER — Inpatient Hospital Stay (HOSPITAL_COMMUNITY): Payer: Medicaid Other | Admitting: Anesthesiology

## 2015-07-09 ENCOUNTER — Encounter (HOSPITAL_COMMUNITY)
Admission: RE | Disposition: A | Discharge status: Home Health | Payer: Self-pay | Source: Ambulatory Visit | Attending: Orthopaedic Surgery

## 2015-07-09 DIAGNOSIS — I129 Hypertensive chronic kidney disease with stage 1 through stage 4 chronic kidney disease, or unspecified chronic kidney disease: Secondary | ICD-10-CM | POA: Diagnosis present

## 2015-07-09 DIAGNOSIS — F17210 Nicotine dependence, cigarettes, uncomplicated: Secondary | ICD-10-CM | POA: Diagnosis present

## 2015-07-09 DIAGNOSIS — G4730 Sleep apnea, unspecified: Secondary | ICD-10-CM | POA: Diagnosis present

## 2015-07-09 DIAGNOSIS — K219 Gastro-esophageal reflux disease without esophagitis: Secondary | ICD-10-CM | POA: Diagnosis present

## 2015-07-09 DIAGNOSIS — M545 Low back pain: Secondary | ICD-10-CM | POA: Diagnosis present

## 2015-07-09 DIAGNOSIS — Z820 Family history of epilepsy and other diseases of the nervous system: Secondary | ICD-10-CM | POA: Diagnosis not present

## 2015-07-09 DIAGNOSIS — N189 Chronic kidney disease, unspecified: Secondary | ICD-10-CM | POA: Diagnosis present

## 2015-07-09 DIAGNOSIS — F419 Anxiety disorder, unspecified: Secondary | ICD-10-CM | POA: Diagnosis present

## 2015-07-09 DIAGNOSIS — M1611 Unilateral primary osteoarthritis, right hip: Secondary | ICD-10-CM | POA: Diagnosis present

## 2015-07-09 DIAGNOSIS — F329 Major depressive disorder, single episode, unspecified: Secondary | ICD-10-CM | POA: Diagnosis present

## 2015-07-09 DIAGNOSIS — E669 Obesity, unspecified: Secondary | ICD-10-CM | POA: Diagnosis present

## 2015-07-09 DIAGNOSIS — Z6830 Body mass index (BMI) 30.0-30.9, adult: Secondary | ICD-10-CM

## 2015-07-09 DIAGNOSIS — M25551 Pain in right hip: Secondary | ICD-10-CM | POA: Diagnosis present

## 2015-07-09 DIAGNOSIS — Z87442 Personal history of urinary calculi: Secondary | ICD-10-CM

## 2015-07-09 DIAGNOSIS — Z419 Encounter for procedure for purposes other than remedying health state, unspecified: Secondary | ICD-10-CM

## 2015-07-09 DIAGNOSIS — Z96641 Presence of right artificial hip joint: Secondary | ICD-10-CM

## 2015-07-09 HISTORY — PX: TOTAL HIP ARTHROPLASTY: SHX124

## 2015-07-09 SURGERY — ARTHROPLASTY, HIP, TOTAL, ANTERIOR APPROACH
Anesthesia: Spinal | Site: Hip | Laterality: Right

## 2015-07-09 MED ORDER — FLUOXETINE HCL 10 MG PO CAPS
10.0000 mg | ORAL_CAPSULE | Freq: Every day | ORAL | Status: DC
  Administered 2015-07-09 – 2015-07-11 (×3): 10 mg via ORAL
  Filled 2015-07-09 (×5): qty 1

## 2015-07-09 MED ORDER — SODIUM CHLORIDE 0.9 % IV SOLN
INTRAVENOUS | Status: DC
  Administered 2015-07-09: 18:00:00 via INTRAVENOUS

## 2015-07-09 MED ORDER — ONDANSETRON HCL 4 MG/2ML IJ SOLN
INTRAMUSCULAR | Status: DC | PRN
  Administered 2015-07-09: 4 mg via INTRAVENOUS

## 2015-07-09 MED ORDER — ONDANSETRON HCL 4 MG/2ML IJ SOLN
4.0000 mg | Freq: Four times a day (QID) | INTRAMUSCULAR | Status: DC | PRN
  Administered 2015-07-10: 4 mg via INTRAVENOUS
  Filled 2015-07-09: qty 2

## 2015-07-09 MED ORDER — ACETAMINOPHEN 325 MG PO TABS
650.0000 mg | ORAL_TABLET | Freq: Four times a day (QID) | ORAL | Status: DC | PRN
Start: 2015-07-09 — End: 2015-07-11
  Administered 2015-07-10 – 2015-07-11 (×2): 650 mg via ORAL
  Filled 2015-07-09 (×2): qty 2

## 2015-07-09 MED ORDER — GABAPENTIN 300 MG PO CAPS
600.0000 mg | ORAL_CAPSULE | Freq: Two times a day (BID) | ORAL | Status: DC
  Administered 2015-07-09 – 2015-07-11 (×4): 600 mg via ORAL
  Filled 2015-07-09 (×4): qty 2

## 2015-07-09 MED ORDER — MIDAZOLAM HCL 2 MG/2ML IJ SOLN
INTRAMUSCULAR | Status: AC
  Filled 2015-07-09: qty 2

## 2015-07-09 MED ORDER — SODIUM CHLORIDE 0.9 % IR SOLN
Status: DC | PRN
  Administered 2015-07-09: 1000 mL

## 2015-07-09 MED ORDER — ACETAMINOPHEN 650 MG RE SUPP: 650.0000 mg | Freq: Four times a day (QID) | RECTAL | Status: DC | PRN

## 2015-07-09 MED ORDER — METOPROLOL SUCCINATE ER 25 MG PO TB24
25.0000 mg | ORAL_TABLET | Freq: Every day | ORAL | Status: DC
  Administered 2015-07-10 – 2015-07-11 (×2): 25 mg via ORAL
  Filled 2015-07-09 (×3): qty 1

## 2015-07-09 MED ORDER — ONDANSETRON HCL 4 MG PO TABS: 4.0000 mg | ORAL_TABLET | Freq: Four times a day (QID) | ORAL | Status: DC | PRN

## 2015-07-09 MED ORDER — METOPROLOL SUCCINATE ER 25 MG PO TB24
25.0000 mg | ORAL_TABLET | Freq: Every day | ORAL | Status: DC
  Filled 2015-07-09: qty 1

## 2015-07-09 MED ORDER — ALBUMIN HUMAN 5 % IV SOLN
INTRAVENOUS | Status: DC | PRN
  Administered 2015-07-09: 15:00:00 via INTRAVENOUS

## 2015-07-09 MED ORDER — ALUM & MAG HYDROXIDE-SIMETH 200-200-20 MG/5ML PO SUSP: 30.0000 mL | ORAL | Status: DC | PRN

## 2015-07-09 MED ORDER — PROPOFOL 10 MG/ML IV BOLUS
INTRAVENOUS | Status: DC | PRN
  Administered 2015-07-09: 100 mg via INTRAVENOUS

## 2015-07-09 MED ORDER — PHENOL 1.4 % MT LIQD: 1.0000 | OROMUCOSAL | Status: DC | PRN

## 2015-07-09 MED ORDER — MEPERIDINE HCL 25 MG/ML IJ SOLN: 6.2500 mg | INTRAMUSCULAR | Status: DC | PRN

## 2015-07-09 MED ORDER — MIDAZOLAM HCL 5 MG/5ML IJ SOLN
INTRAMUSCULAR | Status: DC | PRN
  Administered 2015-07-09 (×2): 1 mg via INTRAVENOUS

## 2015-07-09 MED ORDER — PROPOFOL 10 MG/ML IV BOLUS
INTRAVENOUS | Status: AC
  Filled 2015-07-09: qty 40

## 2015-07-09 MED ORDER — ASPIRIN EC 325 MG PO TBEC
325.0000 mg | DELAYED_RELEASE_TABLET | Freq: Two times a day (BID) | ORAL | Status: DC
  Administered 2015-07-10 – 2015-07-11 (×3): 325 mg via ORAL
  Filled 2015-07-09 (×3): qty 1

## 2015-07-09 MED ORDER — LACTATED RINGERS IV SOLN
INTRAVENOUS | Status: DC
  Administered 2015-07-09 (×2): via INTRAVENOUS

## 2015-07-09 MED ORDER — PROPOFOL 500 MG/50ML IV EMUL
INTRAVENOUS | Status: DC | PRN
  Administered 2015-07-09: 50 ug/kg/min via INTRAVENOUS

## 2015-07-09 MED ORDER — FENTANYL CITRATE (PF) 100 MCG/2ML IJ SOLN
INTRAMUSCULAR | Status: DC | PRN
  Administered 2015-07-09 (×3): 50 ug via INTRAVENOUS

## 2015-07-09 MED ORDER — MENTHOL 3 MG MT LOZG: 1.0000 | LOZENGE | OROMUCOSAL | Status: DC | PRN

## 2015-07-09 MED ORDER — NICOTINE 21 MG/24HR TD PT24
21.0000 mg | MEDICATED_PATCH | Freq: Every day | TRANSDERMAL | Status: DC
  Administered 2015-07-09 – 2015-07-11 (×3): 21 mg via TRANSDERMAL
  Filled 2015-07-09 (×3): qty 1

## 2015-07-09 MED ORDER — PROMETHAZINE HCL 25 MG/ML IJ SOLN
6.2500 mg | INTRAMUSCULAR | Status: DC | PRN
Start: 2015-07-09 — End: 2015-07-09

## 2015-07-09 MED ORDER — PROPOFOL 1000 MG/100ML IV EMUL
INTRAVENOUS | Status: AC
  Filled 2015-07-09: qty 100

## 2015-07-09 MED ORDER — HYDROMORPHONE HCL 1 MG/ML IJ SOLN
1.0000 mg | INTRAMUSCULAR | Status: DC | PRN
  Administered 2015-07-10 – 2015-07-11 (×3): 1 mg via INTRAVENOUS
  Filled 2015-07-09 (×3): qty 1

## 2015-07-09 MED ORDER — METOCLOPRAMIDE HCL 5 MG PO TABS: 5.0000 mg | ORAL_TABLET | Freq: Three times a day (TID) | ORAL | Status: DC | PRN

## 2015-07-09 MED ORDER — BUPIVACAINE HCL (PF) 0.5 % IJ SOLN
INTRAMUSCULAR | Status: DC | PRN
  Administered 2015-07-09: 3 mL via INTRATHECAL

## 2015-07-09 MED ORDER — HYDROMORPHONE HCL 1 MG/ML IJ SOLN: 0.2500 mg | INTRAMUSCULAR | Status: DC | PRN

## 2015-07-09 MED ORDER — DEXTROSE 5 % IV SOLN
500.0000 mg | Freq: Four times a day (QID) | INTRAVENOUS | Status: DC | PRN
  Filled 2015-07-09: qty 5

## 2015-07-09 MED ORDER — PHENYLEPHRINE HCL 10 MG/ML IJ SOLN
INTRAMUSCULAR | Status: DC | PRN
  Administered 2015-07-09: 40 ug via INTRAVENOUS
  Administered 2015-07-09: 80 ug via INTRAVENOUS

## 2015-07-09 MED ORDER — TRANEXAMIC ACID 1000 MG/10ML IV SOLN
1000.0000 mg | INTRAVENOUS | Status: AC
  Administered 2015-07-09: 1000 mg via INTRAVENOUS
  Filled 2015-07-09: qty 10

## 2015-07-09 MED ORDER — EPHEDRINE SULFATE 50 MG/ML IJ SOLN
INTRAMUSCULAR | Status: DC | PRN
  Administered 2015-07-09: 5 mg via INTRAVENOUS
  Administered 2015-07-09: 10 mg via INTRAVENOUS
  Administered 2015-07-09: 5 mg via INTRAVENOUS
  Administered 2015-07-09: 10 mg via INTRAVENOUS
  Administered 2015-07-09: 5 mg via INTRAVENOUS

## 2015-07-09 MED ORDER — DOCUSATE SODIUM 100 MG PO CAPS
100.0000 mg | ORAL_CAPSULE | Freq: Two times a day (BID) | ORAL | Status: DC
  Administered 2015-07-09 – 2015-07-11 (×4): 100 mg via ORAL
  Filled 2015-07-09 (×3): qty 1

## 2015-07-09 MED ORDER — METHOCARBAMOL 500 MG PO TABS
500.0000 mg | ORAL_TABLET | Freq: Four times a day (QID) | ORAL | Status: DC | PRN
  Administered 2015-07-10 – 2015-07-11 (×3): 500 mg via ORAL
  Filled 2015-07-09 (×4): qty 1

## 2015-07-09 MED ORDER — ALPRAZOLAM 0.5 MG PO TABS
0.5000 mg | ORAL_TABLET | Freq: Three times a day (TID) | ORAL | Status: DC | PRN
Start: 2015-07-09 — End: 2015-07-11
  Administered 2015-07-09 – 2015-07-10 (×2): 0.5 mg via ORAL
  Filled 2015-07-09 (×2): qty 1

## 2015-07-09 MED ORDER — ZOLPIDEM TARTRATE 5 MG PO TABS: 5.0000 mg | ORAL_TABLET | Freq: Every evening | ORAL | Status: DC | PRN

## 2015-07-09 MED ORDER — CHLORHEXIDINE GLUCONATE 4 % EX LIQD: 60.0000 mL | Freq: Once | CUTANEOUS | Status: DC

## 2015-07-09 MED ORDER — 0.9 % SODIUM CHLORIDE (POUR BTL) OPTIME
TOPICAL | Status: DC | PRN
  Administered 2015-07-09: 1000 mL

## 2015-07-09 MED ORDER — PANTOPRAZOLE SODIUM 40 MG PO TBEC
80.0000 mg | DELAYED_RELEASE_TABLET | Freq: Every day | ORAL | Status: DC
  Administered 2015-07-10 – 2015-07-11 (×2): 80 mg via ORAL
  Filled 2015-07-09 (×2): qty 2

## 2015-07-09 MED ORDER — KETOROLAC TROMETHAMINE 15 MG/ML IJ SOLN
7.5000 mg | Freq: Four times a day (QID) | INTRAMUSCULAR | Status: AC
  Administered 2015-07-09 – 2015-07-10 (×4): 7.5 mg via INTRAVENOUS
  Filled 2015-07-09 (×3): qty 1

## 2015-07-09 MED ORDER — OXYCODONE HCL 5 MG PO TABS
5.0000 mg | ORAL_TABLET | ORAL | Status: DC | PRN
  Administered 2015-07-10 – 2015-07-11 (×8): 10 mg via ORAL
  Filled 2015-07-09 (×8): qty 2

## 2015-07-09 MED ORDER — FENTANYL CITRATE (PF) 250 MCG/5ML IJ SOLN
INTRAMUSCULAR | Status: AC
  Filled 2015-07-09: qty 5

## 2015-07-09 MED ORDER — METOCLOPRAMIDE HCL 5 MG/ML IJ SOLN: 5.0000 mg | Freq: Three times a day (TID) | INTRAMUSCULAR | Status: DC | PRN

## 2015-07-09 MED ORDER — DIPHENHYDRAMINE HCL 12.5 MG/5ML PO ELIX: 12.5000 mg | ORAL_SOLUTION | ORAL | Status: DC | PRN

## 2015-07-09 SURGICAL SUPPLY — 57 items
APL SKNCLS STERI-STRIP NONHPOA (GAUZE/BANDAGES/DRESSINGS) ×1
BENZOIN TINCTURE PRP APPL 2/3 (GAUZE/BANDAGES/DRESSINGS) ×3 IMPLANT
BLADE SAW SGTL 18X1.27X75 (BLADE) ×2 IMPLANT
BLADE SAW SGTL 18X1.27X75MM (BLADE) ×1
BLADE SURG ROTATE 9660 (MISCELLANEOUS) IMPLANT
CAPT HIP TOTAL 2 ×2 IMPLANT
CELLS DAT CNTRL 66122 CELL SVR (MISCELLANEOUS) ×1 IMPLANT
CLOSURE STERI-STRIP 1/2X4 (GAUZE/BANDAGES/DRESSINGS) ×1
CLOSURE WOUND 1/2 X4 (GAUZE/BANDAGES/DRESSINGS) ×2
CLSR STERI-STRIP ANTIMIC 1/2X4 (GAUZE/BANDAGES/DRESSINGS) ×2 IMPLANT
COVER SURGICAL LIGHT HANDLE (MISCELLANEOUS) ×3 IMPLANT
DRAPE C-ARM 42X72 X-RAY (DRAPES) ×3 IMPLANT
DRAPE STERI IOBAN 125X83 (DRAPES) ×3 IMPLANT
DRAPE U-SHAPE 47X51 STRL (DRAPES) ×9 IMPLANT
DRSG AQUACEL AG ADV 3.5X10 (GAUZE/BANDAGES/DRESSINGS) ×3 IMPLANT
DURAPREP 26ML APPLICATOR (WOUND CARE) ×3 IMPLANT
ELECT BLADE 4.0 EZ CLEAN MEGAD (MISCELLANEOUS) ×3
ELECT BLADE 6.5 EXT (BLADE) IMPLANT
ELECT REM PT RETURN 9FT ADLT (ELECTROSURGICAL) ×3
ELECTRODE BLDE 4.0 EZ CLN MEGD (MISCELLANEOUS) ×1 IMPLANT
ELECTRODE REM PT RTRN 9FT ADLT (ELECTROSURGICAL) ×1 IMPLANT
FACESHIELD WRAPAROUND (MASK) ×6 IMPLANT
FACESHIELD WRAPAROUND OR TEAM (MASK) ×2 IMPLANT
GLOVE BIOGEL PI IND STRL 8 (GLOVE) ×2 IMPLANT
GLOVE BIOGEL PI INDICATOR 8 (GLOVE) ×4
GLOVE ECLIPSE 8.0 STRL XLNG CF (GLOVE) ×3 IMPLANT
GLOVE ORTHO TXT STRL SZ7.5 (GLOVE) ×6 IMPLANT
GOWN STRL REUS W/ TWL LRG LVL3 (GOWN DISPOSABLE) ×2 IMPLANT
GOWN STRL REUS W/ TWL XL LVL3 (GOWN DISPOSABLE) ×2 IMPLANT
GOWN STRL REUS W/TWL LRG LVL3 (GOWN DISPOSABLE) ×6
GOWN STRL REUS W/TWL XL LVL3 (GOWN DISPOSABLE) ×6
HANDPIECE INTERPULSE COAX TIP (DISPOSABLE) ×3
HEAD CERAMIC 36 PLUS5 (Hips) ×2 IMPLANT
KIT BASIN OR (CUSTOM PROCEDURE TRAY) ×3 IMPLANT
KIT ROOM TURNOVER OR (KITS) ×3 IMPLANT
MANIFOLD NEPTUNE II (INSTRUMENTS) ×3 IMPLANT
NS IRRIG 1000ML POUR BTL (IV SOLUTION) ×3 IMPLANT
PACK TOTAL JOINT (CUSTOM PROCEDURE TRAY) ×3 IMPLANT
PAD ARMBOARD 7.5X6 YLW CONV (MISCELLANEOUS) ×3 IMPLANT
RETRACTOR WND ALEXIS 18 MED (MISCELLANEOUS) ×1 IMPLANT
RTRCTR WOUND ALEXIS 18CM MED (MISCELLANEOUS) ×3
SET HNDPC FAN SPRY TIP SCT (DISPOSABLE) ×1 IMPLANT
STAPLER VISISTAT 35W (STAPLE) IMPLANT
STRIP CLOSURE SKIN 1/2X4 (GAUZE/BANDAGES/DRESSINGS) ×4 IMPLANT
SUT ETHIBOND NAB CT1 #1 30IN (SUTURE) ×3 IMPLANT
SUT ETHILON 2 0 FS 18 (SUTURE) ×2 IMPLANT
SUT MNCRL AB 4-0 PS2 18 (SUTURE) IMPLANT
SUT VIC AB 0 CT1 27 (SUTURE) ×3
SUT VIC AB 0 CT1 27XBRD ANBCTR (SUTURE) ×1 IMPLANT
SUT VIC AB 1 CT1 27 (SUTURE) ×3
SUT VIC AB 1 CT1 27XBRD ANBCTR (SUTURE) ×1 IMPLANT
SUT VIC AB 2-0 CT1 27 (SUTURE) ×3
SUT VIC AB 2-0 CT1 TAPERPNT 27 (SUTURE) ×1 IMPLANT
TOWEL OR 17X24 6PK STRL BLUE (TOWEL DISPOSABLE) ×3 IMPLANT
TOWEL OR 17X26 10 PK STRL BLUE (TOWEL DISPOSABLE) ×3 IMPLANT
TRAY FOLEY CATH 16FRSI W/METER (SET/KITS/TRAYS/PACK) IMPLANT
WATER STERILE IRR 1000ML POUR (IV SOLUTION) ×6 IMPLANT

## 2015-07-09 NOTE — Anesthesia Preprocedure Evaluation (Addendum)
Anesthesia Evaluation  Patient identified by MRN, date of birth, ID band Patient awake    Reviewed: Allergy & Precautions, NPO status , Patient's Chart, lab work & pertinent test results, reviewed documented beta blocker date and time   Airway Mallampati: II  TM Distance: >3 FB Neck ROM: Full    Dental no notable dental hx. (+) Teeth Intact, Missing, Caps   Pulmonary shortness of breath, sleep apnea , Current Smoker,    Pulmonary exam normal breath sounds clear to auscultation       Cardiovascular hypertension, Pt. on medications and Pt. on home beta blockers Normal cardiovascular exam Rhythm:Regular Rate:Normal  Pt forgot to take beta blocker this morning; HR was in 50s in short stay anyway...   Neuro/Psych Seizures -, Well Controlled,  PSYCHIATRIC DISORDERS Anxiety Depression Remote occurrence in high school; ?sports injury?  Neuromuscular disease    GI/Hepatic Neg liver ROS, GERD  ,  Endo/Other  negative endocrine ROS  Renal/GU Renal disease     Musculoskeletal  (+) Arthritis ,   Abdominal (+) + obese,   Peds  Hematology negative hematology ROS (+)   Anesthesia Other Findings Crowns across top front  Reproductive/Obstetrics negative OB ROS                          Anesthesia Physical Anesthesia Plan  ASA: II  Anesthesia Plan:    Post-op Pain Management:    Induction:   Airway Management Planned:   Additional Equipment:   Intra-op Plan:   Post-operative Plan:   Informed Consent: I have reviewed the patients History and Physical, chart, labs and discussed the procedure including the risks, benefits and alternatives for the proposed anesthesia with the patient or authorized representative who has indicated his/her understanding and acceptance.   Dental advisory given  Plan Discussed with: CRNA  Anesthesia Plan Comments:         Anesthesia Quick Evaluation

## 2015-07-09 NOTE — H&P (Signed)
TOTAL HIP ADMISSION H&P  Patient is admitted for right total hip arthroplasty.  Subjective:  Chief Complaint: right hip pain  HPI: Evan [name], 60 y.o. male, has a history of pain and functional disability in the right hip(s) due to arthritis and patient has failed non-surgical conservative treatments for greater than 12 weeks to include NSAID's and/or analgesics, corticosteriod injections, use of assistive devices, weight reduction as appropriate and activity modification.  Onset of symptoms was gradual starting 5 years ago with gradually worsening course since that time.The patient noted no past surgery on the right hip(s).  Patient currently rates pain in the right hip at 9 out of 10 with activity. Patient has night pain, worsening of pain with activity and weight bearing, pain that interfers with activities of daily living and pain with passive range of motion. Patient has evidence of subchondral sclerosis, periarticular osteophytes and joint space narrowing by imaging studies. This condition presents safety issues increasing the risk of falls.  There is no current active infection.  Patient Active Problem List   Diagnosis Date Noted  . Osteoarthritis of right hip 07/09/2015  . Right-sided low back pain with left-sided sciatica   . Low back pain 09/20/2013  . Neck pain 09/20/2013  . Alteration consciousness 09/20/2013   Past Medical History  Diagnosis Date  . Neck pain   . Right-sided low back pain with left-sided sciatica   . GERD (gastroesophageal reflux disease)   . Depression   . Hypertension   . Seizures ([place])     no seizures in many years  . Sleep apnea     last sleep study more than 5 years  uses CPAP even when napping  . Shortness of breath dyspnea     with  exertion  . Anxiety   . Chronic kidney disease     kidney stones  . Arthritis   . 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 Neck surgery      4-5--6 and 7  . Left leg    . Kidney stones      No prescriptions prior to admission   Allergies  Allergen Reactions  . Morphine And Related Nausea And Vomiting  . Buprenorphine Hcl Nausea And Vomiting    Social History  Substance Use Topics  . Smoking status: Current Every Day Smoker -- 2.00 packs/day for 45 years    Types: Cigarettes  . Smokeless tobacco: Never Used  . Alcohol Use: 0.6 - 1.2 oz/week    1-2 Shots of liquor per week     Comment: daily     Family History  Problem Relation Age of Onset  . Hepatitis Mother   . Alzheimer's disease Father      Review of Systems  Musculoskeletal: Positive for joint pain.  All other systems reviewed and are negative.   Objective:  Physical Exam  Constitutional: He is oriented to person, place, and time. He appears well-developed and well-nourished.  HENT:  Head: Normocephalic and atraumatic.  Eyes: EOM are normal. Pupils are equal, round, and reactive to light.  Neck: Normal range of motion. Neck supple.  Cardiovascular: Normal rate and regular rhythm.   Respiratory: Effort normal and breath sounds normal.  GI: Soft. Bowel sounds are normal.  Musculoskeletal:       Right hip: He exhibits decreased range of motion, decreased strength, tenderness and bony tenderness.  Neurological: He is alert and oriented to person, place, and time.  Skin: Skin is warm and dry.  Psychiatric: He has a normal mood and affect.    Vital signs in last 24 hours:    Labs:   Estimated body mass index is 30.26 kg/(m^2) as calculated from the following:   Height as of 11/12/14: 5\' 9"  (1.753 m).   Weight as of 11/12/14: 92.987 kg (205 lb).   Imaging Review Plain radiographs demonstrate severe degenerative joint disease of the right hip(s). The bone quality appears to be good for age and reported activity level.  Assessment/Plan:  End stage arthritis, right hip(s)  The patient history,  physical examination, clinical judgement of the provider and imaging studies are consistent with end stage degenerative joint disease of the right hip(s) and total hip arthroplasty is deemed medically necessary. The treatment options including medical management, injection therapy, arthroscopy and arthroplasty were discussed at length. The risks and benefits of total hip arthroplasty were presented and reviewed. The risks due to aseptic loosening, infection, stiffness, dislocation/subluxation,  thromboembolic complications and other imponderables were discussed.  The patient acknowledged the explanation, agreed to proceed with the plan and consent was signed. Patient is being admitted for inpatient treatment for surgery, pain control, PT, OT, prophylactic antibiotics, VTE prophylaxis, progressive ambulation and ADL's and discharge planning.The patient is planning to be discharged home with home health services

## 2015-07-09 NOTE — Progress Notes (Signed)
Placed patient on CPAP for the night via auto-mode with minimum pressure set at 6cm and maximum pressure set at 20cm  

## 2015-07-09 NOTE — Progress Notes (Signed)
Still waiting for spinal to wear off.  Report to M. Judye Bos RN as primary caregiver

## 2015-07-09 NOTE — Anesthesia Postprocedure Evaluation (Signed)
Anesthesia Post Note  Patient: Evan [name]  Procedure(s) Performed: Procedure(s) (LRB): RIGHT TOTAL HIP ARTHROPLASTY ANTERIOR APPROACH (Right)  Patient location during evaluation: PACU Anesthesia Type: General Level of consciousness: sedated and patient cooperative Pain management: pain level controlled Vital Signs Assessment: post-procedure vital signs reviewed and stable Respiratory status: spontaneous breathing Cardiovascular status: stable Anesthetic complications: no    Last Vitals:  Filed Vitals:   07/09/15 1939 07/09/15 2125  BP: 122/75 142/91  Pulse: 82 96  Temp: 36.7 C 36.7 C  Resp: 14 15    Last Pain:  Filed Vitals:   07/09/15 2125  PainSc: Asleep                 Nolon Nations

## 2015-07-09 NOTE — Anesthesia Procedure Notes (Addendum)
Spinal Patient location during procedure: OR Staffing Anesthesiologist: Nolon Nations Performed by: anesthesiologist  Preanesthetic Checklist Completed: patient identified, site marked, surgical consent, pre-op evaluation, timeout performed, IV checked, risks and benefits discussed and monitors and equipment checked Spinal Block Patient position: sitting Prep: ChloraPrep Patient monitoring: heart rate, continuous pulse ox and blood pressure Approach: left paramedian Location: L2-3 Injection technique: single-shot Needle Needle type: Sprotte  Needle gauge: 24 G Needle length: 9 cm Additional Notes Expiration date of kit checked and confirmed. Patient tolerated procedure well, without complications.    Procedure Name: LMA Insertion Date/Time: 07/09/2015 2:10 PM Performed by: Williemae Area B Pre-anesthesia Checklist: Patient identified, Emergency Drugs available, Suction available and Patient being monitored Patient Re-evaluated:Patient Re-evaluated prior to inductionOxygen Delivery Method: Circle system utilized Preoxygenation: Pre-oxygenation with 100% oxygen Intubation Type: IV induction Ventilation: Mask ventilation without difficulty LMA: LMA inserted LMA Size: 5.0 Number of attempts: 1 Placement Confirmation: ETT inserted through vocal cords under direct vision,  breath sounds checked- equal and bilateral and positive ETCO2 Tube secured with: Tape (taped across cheeks) Dental Injury: Teeth and Oropharynx as per pre-operative assessment

## 2015-07-09 NOTE — Brief Op Note (Signed)
07/09/2015  3:12 PM  PATIENT:  Odessa Fleming  60 y.o. male  PRE-OPERATIVE DIAGNOSIS:  Severe osteoarthritis right hip  POST-OPERATIVE DIAGNOSIS:  Severe osteoarthritis right hip  PROCEDURE:  Procedure(s): RIGHT TOTAL HIP ARTHROPLASTY ANTERIOR APPROACH (Right)  SURGEON:  Surgeon(s) and Role:    * Mcarthur Rossetti, MD - Primary  PHYSICIAN ASSISTANT: Benita Stabile, PA-C  ANESTHESIA:   spinal and general  EBL:  Total I/O In: 1000 [I.V.:1000] Out: 250 [Blood:250]  COUNTS:  YES  TOURNIQUET:  * No tourniquets in log *  DICTATION: .Other Dictation: Dictation Number [id number]  PLAN OF CARE: Admit to inpatient   PATIENT DISPOSITION:  PACU - hemodynamically stable.   Delay start of Pharmacological VTE agent (>24hrs) due to surgical blood loss or risk of bleeding: no

## 2015-07-09 NOTE — Progress Notes (Signed)
Dr. Lissa Hoard notified that beta blocker was held after reassessment of HR and BP

## 2015-07-09 NOTE — Transfer of Care (Signed)
Immediate Anesthesia Transfer of Care Note  Patient: Evan [name]  Procedure(s) Performed: Procedure(s): RIGHT TOTAL HIP ARTHROPLASTY ANTERIOR APPROACH (Right)  Patient Location: PACU  Anesthesia Type:General and Spinal  Level of Consciousness: awake and patient cooperative  Airway & Oxygen Therapy: Patient Spontanous Breathing and Patient connected to nasal cannula oxygen  Post-op Assessment: Report given to RN and Post -op Vital signs reviewed and stable  Post vital signs: Reviewed and stable  Last Vitals:  Filed Vitals:   07/09/15 1059 07/09/15 1148  BP: 127/84 135/85  Pulse: 90 56  Temp: [id number] C     Complications: No apparent anesthesia complications

## 2015-07-10 ENCOUNTER — Encounter (HOSPITAL_COMMUNITY): Payer: Self-pay | Admitting: Orthopaedic Surgery

## 2015-07-10 LAB — CBC
HEMATOCRIT: 38.4 % — AB (ref 39.0–52.0)
Hemoglobin: 12.4 g/dL — ABNORMAL LOW (ref 13.0–17.0)
MCH: 28.8 pg (ref 26.0–34.0)
MCHC: 32.3 g/dL (ref 30.0–36.0)
MCV: 89.1 fL (ref 78.0–100.0)
Platelets: 141 10*3/uL — ABNORMAL LOW (ref 150–400)
RBC: 4.31 MIL/uL (ref 4.22–5.81)
RDW: 13.8 % (ref 11.5–15.5)
WBC: 9.7 10*3/uL (ref 4.0–10.5)

## 2015-07-10 LAB — BASIC METABOLIC PANEL
ANION GAP: 7 (ref 5–15)
BUN: 13 mg/dL (ref 6–20)
CHLORIDE: 106 mmol/L (ref 101–111)
CO2: 26 mmol/L (ref 22–32)
Calcium: 8.4 mg/dL — ABNORMAL LOW (ref 8.9–10.3)
Creatinine, Ser: 0.99 mg/dL (ref 0.61–1.24)
GFR calc non Af Amer: >60 mL/min (ref >60)
Glucose, Bld: 103 mg/dL — ABNORMAL HIGH (ref 65–99)
POTASSIUM: 4 mmol/L (ref 3.5–5.1)
SODIUM: 139 mmol/L (ref 135–145)

## 2015-07-10 MED ORDER — PANTOPRAZOLE SODIUM 40 MG PO TBEC: 40.0000 mg | DELAYED_RELEASE_TABLET | Freq: Every day | ORAL | Status: DC

## 2015-07-10 NOTE — Progress Notes (Signed)
Physical Therapy Treatment Patient Details Name: Evan [name] MRN: [id number] DOB: 25-Nov-1955 Today's Date: 07/10/2015    History of Present Illness Pt presents for pain right hip, underwent direct anterior THA. 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 No nausea noted with this session. PT will continue to follow.   Follow Up Recommendations  Home health PT;Supervision for mobility/OOB     Equipment Recommendations  Rolling walker with 5" wheels;3in1 (PT)    Recommendations for Other Services       Precautions / Restrictions Precautions Precautions: Fall Precaution Comments: Direct anterior hip-no precautions Restrictions Weight Bearing Restrictions: Yes RLE Weight Bearing: Weight bearing as tolerated    Mobility  Bed Mobility Overal bed mobility: Needs Assistance Bed Mobility: Rolling;Sidelying to Sit Rolling: Supervision         General bed mobility comments: pt able to get to EOB with use of rail but no physical assist  Transfers Overall transfer level: Needs assistance Equipment used: Rolling walker (2 wheeled) Transfers: Sit to/from Stand Sit to Stand: Supervision         General transfer comment: pt able to stand from bed with proper hand placement and supervision  Ambulation/Gait Ambulation/Gait assistance: Min guard Ambulation Distance (Feet): 75 Feet Assistive device: Rolling walker (2 wheeled) Gait Pattern/deviations: Step-through pattern;Decreased stride length;Antalgic Gait velocity: decreased Gait velocity interpretation: Below normal speed for age/gender General Gait Details: decreased wt shift to right and antalgia noted but improved with distance until 34' when pt began to fatigue and then increased trunk flexion, decreased pace   Stairs            Wheelchair Mobility    Modified Rankin (Stroke Patients  Only)       Balance Overall balance assessment: Needs assistance Sitting-balance support: Feet supported Sitting balance-Leahy Scale: Good Sitting balance - Comments: pt able to sit and reach without LOB   Standing balance support: Single extremity supported;During functional activity Standing balance-Leahy Scale: Poor Standing balance comment: able to stand with only RUE support, but unsafe with no UE support                    Cognition Arousal/Alertness: Awake/alert Behavior During Therapy: WFL for tasks assessed/performed Overall Cognitive Status: Within Functional Limits for tasks assessed                      Exercises Total Joint Exercises Ankle Circles/Pumps: AROM;Both;20 reps;Supine Quad Sets: AROM;Both;10 reps;Supine Gluteal Sets: AROM;Both;10 reps;Supine Heel Slides: AROM;Right;Supine;10 reps Hip ABduction/ADduction: AAROM;Right;10 reps;Seated Straight Leg Raises: AAROM;Right;10 reps;Supine    General Comments General comments (skin integrity, edema, etc.): moving much better in PM when not nauseous. Pt does not want to go to rehab, adamant about going home      Pertinent Vitals/Pain Pain Assessment: Faces Faces Pain Scale: Hurts little more Pain Location: right hip Pain Descriptors / Indicators: Aching Pain Intervention(s): Limited activity within patient's tolerance;Monitored during session;Premedicated before session;Repositioned    Home Living                      Prior Function            PT Goals (current goals can now be found in the care plan section) Acute Rehab PT Goals Patient Stated Goal: return home PT Goal Formulation: With patient Time For Goal  Achievement: 07/17/15 Potential to Achieve Goals: Good Progress towards PT goals: Progressing toward goals    Frequency  7X/week    PT Plan Discharge plan needs to be updated    Co-evaluation             End of Session Equipment Utilized During Treatment:  Gait belt Activity Tolerance: Patient tolerated treatment well Patient left: with call bell/phone within reach;with family/visitor present;in bed     Time: 1440-1506 PT Time Calculation (min) (ACUTE ONLY): 26 min  Charges:  $Gait Training: 23-37 mins                    G Codes:     Leighton Roach, PT  Acute Rehab Services  [phone]  Leighton Roach 07/10/2015, 3:17 PM

## 2015-07-10 NOTE — Evaluation (Addendum)
Physical Therapy Evaluation Patient Details Name: Evan [name] MRN: [id number] DOB: Dec 17, 1955 Today's Date: 07/10/2015   History of Present Illness  Pt presents for pain right hip, underwent direct anterior THA. 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 Pt ambulated 15' with RW and min A, was limited by pain and nausea this first time up.  Pt will benefit from skilled PT to increase their independence and safety with mobility to allow discharge to the venue listed below.     Follow Up Recommendations CIR    Equipment Recommendations  Rolling walker with 5" wheels;3in1 (PT)    Recommendations for Other Services OT consult;Rehab consult     Precautions / Restrictions Precautions Precautions: Fall Restrictions Weight Bearing Restrictions: Yes RLE Weight Bearing: Weight bearing as tolerated      Mobility  Bed Mobility Overal bed mobility: Needs Assistance Bed Mobility: Rolling;Sidelying to Sit Rolling: Min assist Sidelying to sit: Mod assist       General bed mobility comments: pt able to roll onto right side, min A to achieve full SL, mod A to left hip with elevation of trunk, RLE supported during transition  Transfers Overall transfer level: Needs assistance Equipment used: Rolling walker (2 wheeled) Transfers: Sit to/from Stand Sit to Stand: Min assist         General transfer comment: vc's for hand placement, min A for support. Pt slightly dizzy with initial standing  Ambulation/Gait Ambulation/Gait assistance: Min assist Ambulation Distance (Feet): 15 Feet Assistive device: Rolling walker (2 wheeled) Gait Pattern/deviations: Step-to pattern;Decreased weight shift to right;Antalgic Gait velocity: decreased Gait velocity interpretation: Below normal speed for age/gender General Gait Details: pt with difficulty WB'ing on right side.  Sufficient strength left side to use RW. Pt began to get nauseous which limited distance  Stairs            Wheelchair Mobility    Modified Rankin (Stroke Patients Only)       Balance Overall balance assessment: Needs assistance Sitting-balance support: Feet supported Sitting balance-Leahy Scale: Fair     Standing balance support: Bilateral upper extremity supported Standing balance-Leahy Scale: Poor Standing balance comment: requires UE support to maintain balance                             Pertinent Vitals/Pain Pain Assessment: 0-10 Pain Score: 7  Pain Location: right hip Pain Descriptors / Indicators: Aching Pain Intervention(s): Limited activity within patient's tolerance;Monitored during session;Premedicated before session    Home Living Family/patient expects to be discharged to:: Private residence Living Arrangements: Spouse/significant other Available Help at Discharge: Family;Available PRN/intermittently Type of Home: House Home Access: Stairs to enter Entrance Stairs-Rails: None Entrance Stairs-Number of Steps: 1 Home Layout: One level Home Equipment: Cane - single point Additional Comments: pt's wife will be home with him only first week and then will go back to work. Pt on disability from old neck injury    Prior Function Level of Independence: Independent with assistive device(s)         Comments: used SPC on right, able to be home alone but was on disability for neck     Hand Dominance        Extremity/Trunk Assessment   Upper Extremity Assessment: Defer to OT evaluation;LUE deficits/detail       LUE Deficits / Details: weakness noted  left UE including hand, but strength sufficient for holding RW with left hand   Lower Extremity Assessment: RLE deficits/detail;LLE deficits/detail RLE Deficits / Details: hip flex 2-/5, knee flex/ ext 3/5, ankle WFL LLE Deficits / Details: strength grossly 4/5 throughout  Cervical /  Trunk Assessment: Kyphotic  Communication   Communication: No difficulties  Cognition Arousal/Alertness: Awake/alert Behavior During Therapy: WFL for tasks assessed/performed Overall Cognitive Status: Within Functional Limits for tasks assessed                      General Comments General comments (skin integrity, edema, etc.): given pt's left sided pre-existing weakness and now sx on right side, pt may need higher level of therapy than HH to return to independence    Exercises Total Joint Exercises Ankle Circles/Pumps: AROM;Both;20 reps;Seated Quad Sets: AROM;Both;10 reps;Seated Gluteal Sets: AROM;Both;10 reps;Seated Heel Slides: AROM;Right;5 reps;Supine Hip ABduction/ADduction: AAROM;Right;10 reps;Seated      Assessment/Plan    PT Assessment Patient needs continued PT services  PT Diagnosis Difficulty walking;Abnormality of gait;Acute pain   PT Problem List Decreased strength;Decreased range of motion;Decreased activity tolerance;Decreased balance;Decreased mobility;Decreased knowledge of precautions;Decreased knowledge of use of DME;Pain  PT Treatment Interventions DME instruction;Gait training;Stair training;Functional mobility training;Therapeutic activities;Therapeutic exercise;Balance training;Patient/family education   PT Goals (Current goals can be found in the Care Plan section) Acute Rehab PT Goals Patient Stated Goal: return home PT Goal Formulation: With patient Time For Goal Achievement: 07/17/15 Potential to Achieve Goals: Good    Frequency 7X/week   Barriers to discharge        Co-evaluation               End of Session Equipment Utilized During Treatment: Gait belt Activity Tolerance: Other (comment);Patient limited by pain (nausea) Patient left: in chair;with call bell/phone within reach;with family/visitor present Nurse Communication: Mobility status;Other (comment) (nausea)         Time: [id number] PT Time Calculation (min) (ACUTE  ONLY): 30 min   Charges:   PT Evaluation $PT Eval Moderate Complexity: 1 Procedure PT Treatments $Gait Training: 8-22 mins   PT G Codes:      Leighton Roach, PT  Acute Rehab Services  [place] 07/10/2015, 10:47 AM

## 2015-07-10 NOTE — Progress Notes (Signed)
Subjective: 1 Day Post-Op Procedure(s) (LRB): RIGHT TOTAL HIP ARTHROPLASTY ANTERIOR APPROACH (Right) Patient reports pain as moderate.    Objective: Vital signs in last 24 hours: Temp:  [97.7 F (36.5 C)-98.7 F (37.1 C)] 98.7 F (37.1 C) (03/15 0436) Pulse Rate:  [52-100] 87 (03/15 0436) Resp:  [12-18] 16 (03/15 0436) BP: (109-160)/(64-91) 109/64 mmHg (03/15 0436) SpO2:  [95 %-100 %] 95 % (03/15 0436) Weight:  [93.668 kg (206 lb 8 oz)] 93.668 kg (206 lb 8 oz) (03/14 1059)  Intake/Output from previous day: 03/14 0701 - 03/15 0700 In: 2883.8 [I.V.:2633.8; IV Piggyback:250] Out: 1350 [Urine:1100; Blood:250] Intake/Output this shift:     Recent Labs  07/10/15 0523  HGB 12.4*    Recent Labs  07/10/15 0523  WBC 9.7  RBC 4.31  HCT 38.4*  PLT 141*    Recent Labs  07/10/15 0523  NA 139  K 4.0  CL 106  CO2 26  BUN 13  CREATININE 0.99  GLUCOSE 103*  CALCIUM 8.4*   No results for input(s): LABPT, INR in the last 72 hours.  Sensation intact distally Intact pulses distally Dorsiflexion/Plantar flexion intact Incision: scant drainage  Assessment/Plan: 1 Day Post-Op Procedure(s) (LRB): RIGHT TOTAL HIP ARTHROPLASTY ANTERIOR APPROACH (Right) Up with therapy  Jaleesa Cervi Y 07/10/2015, 7:35 AM

## 2015-07-10 NOTE — Care Management Note (Addendum)
Case Management Note  Patient Details  Name: Evan [name] MRN: [id number] Date of Birth: June 20, 1955  Subjective/Objective:   60 yr old gentleman s/p right total hip arthroplasty.                  Action/Plan: Case manager spoke with patient at the bedside concerning home health and DME needs at discharge. Patient was preoperatively setup with [facility], no changes.    Expected Discharge Date:   07/11/15               Expected Discharge Plan:  [place]  In-House Referral:     Discharge planning Services  CM Consult  Post Acute Care Choice:  Home Health Choice offered to:  Patient  DME Arranged:  3-N-1, Walker rolling DME Agency:  [place]:  PT, OT [facility] Agency:  [place] Status of Service:  Completed, signed off  Medicare Important Message Given:    Date Medicare IM Given:    Medicare IM give by:    Date Additional Medicare IM Given:    Additional Medicare Important Message give by:     If discussed at [place] of Stay Meetings, dates discussed:    Additional Comments:  Ninfa Meeker, RN 07/10/2015, 11:28 AM

## 2015-07-10 NOTE — Evaluation (Signed)
Occupational Therapy Evaluation Patient Details Name: Evan [name] MRN: [id number] DOB: 1956-03-11 Today's Date: 07/10/2015    History of Present Illness Pt presents for pain right hip, underwent direct anterior THA. 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 Currently pt overall min-mod assist for ADLs and short distance functional mobility; OT eval limited by pain and nausea. Began safety and ADL education with pt and wife. Recommending CIR for follow up in order to maximize independence and safety with ADLs and functional mobility prior to return home. Pt would benefit from continued skilled OT to address established goals.     Follow Up Recommendations  CIR;Supervision/Assistance - 24 hour    Equipment Recommendations  3 in 1 bedside comode;Tub/shower bench;Other (comment) (Adaptive equipment)    Recommendations for Other Services Rehab consult     Precautions / Restrictions Precautions Precautions: Fall Precaution Comments: Direct anterior hip-no precautions Restrictions Weight Bearing Restrictions: Yes RLE Weight Bearing: Weight bearing as tolerated      Mobility Bed Mobility Overal bed mobility: Needs Assistance Bed Mobility: Rolling;Sidelying to Sit Rolling: Min assist Sidelying to sit: Mod assist       General bed mobility comments: Pt OOB in chair upon arrival.  Transfers Overall transfer level: Needs assistance Equipment used: Rolling walker (2 wheeled) Transfers: Sit to/from Stand Sit to Stand: Min assist         General transfer comment: Good hand placement. Min assist to boost up from chair x 1 and for balance in standing.    Balance Overall balance assessment: Needs assistance Sitting-balance support: No upper extremity supported;Feet supported Sitting balance-Leahy Scale: Fair     Standing balance support: Bilateral upper extremity  supported Standing balance-Leahy Scale: Poor Standing balance comment: RW for support                            ADL Overall ADL's : Needs assistance/impaired Eating/Feeding: Set up;Sitting   Grooming: Set up;Sitting   Upper Body Bathing: Minimal assitance;Sitting   Lower Body Bathing: Moderate assistance;Sit to/from stand   Upper Body Dressing : Minimal assistance;Sitting   Lower Body Dressing: Moderate assistance;Sit to/from stand Lower Body Dressing Details (indicate cue type and reason): Pt unable to reach feet for donning socks. Wife can assist as needed for first week. Discussed use of AE for increased independence with LB ADLs; pt agreeable to practice. Educated on compensatory strategies for LB ADLs. Toilet Transfer: Minimal Systems analyst Details (indicate cue type and reason): Transfers limited this session by pain and nausea. Anticipate good progression with transfers pending pain and nausea resolution. Toileting- Clothing Manipulation and Hygiene: Minimal assistance;Sit to/from stand       Functional mobility during ADLs: Minimal assistance;Rolling walker;Cueing for safety General ADL Comments: Pts wife present for OT eval. Session limited by pain and nausea.      Vision     Perception     Praxis      Pertinent Vitals/Pain Pain Assessment: Faces Pain Score: 7  Faces Pain Scale: Hurts even more Pain Location: R hip with movement Pain Descriptors / Indicators: Aching;Grimacing;Guarding Pain Intervention(s): Limited activity within patient's tolerance;Monitored during session;Repositioned;Ice applied     Hand Dominance Right   Extremity/Trunk Assessment Upper Extremity Assessment Upper Extremity Assessment: LUE deficits/detail LUE Deficits / Details: Prior LUE weakness and decreased ROM. Able to use LUE to assist with transfers and  gripping RW.   Lower Extremity Assessment Lower Extremity Assessment: Defer to PT  evaluation RLE Deficits / Details: hip flex 2-/5, knee flex/ ext 3/5, ankle WFL RLE: Unable to fully assess due to pain LLE Deficits / Details: strength grossly 4/5 throughout   Cervical / Trunk Assessment Cervical / Trunk Assessment: Kyphotic   Communication Communication Communication: No difficulties   Cognition Arousal/Alertness: Awake/alert Behavior During Therapy: WFL for tasks assessed/performed Overall Cognitive Status: Within Functional Limits for tasks assessed                     General Comments       Exercises Exercises: Total Joint     Shoulder Instructions      Home Living Family/patient expects to be discharged to:: Private residence Living Arrangements: Spouse/significant other Available Help at Discharge: Family;Available PRN/intermittently Type of Home: House Home Access: Stairs to enter [employer] of Steps: 1 Entrance Stairs-Rails: None Home Layout: One level     Bathroom Shower/Tub: Tub/shower unit;Curtain Shower/tub characteristics: Architectural technologist: Standard Bathroom Accessibility: Yes How Accessible: Accessible via walker Home Equipment: Kasandra Knudsen - single point   Additional Comments: pt's wife will be home with him only first week and then will go back to work. Pt on disability from old neck injury      Prior Functioning/Environment Level of Independence: Independent with assistive device(s)        Comments: used SPC on right, able to be home alone but was on disability for neck    OT Diagnosis: Generalized weakness;Acute pain   OT Problem List: Decreased strength;Decreased range of motion;Decreased activity tolerance;Impaired balance (sitting and/or standing);Decreased safety awareness;Decreased knowledge of use of DME or AE;Decreased knowledge of precautions;Impaired UE functional use;Pain   OT Treatment/Interventions: Self-care/ADL training;Energy conservation;DME and/or AE instruction;Therapeutic  activities;Patient/family education;Balance training    OT Goals(Current goals can be found in the care plan section) Acute Rehab OT Goals Patient Stated Goal: return home OT Goal Formulation: With patient Time For Goal Achievement: 07/24/15 Potential to Achieve Goals: Good ADL Goals Pt Will Perform Grooming: with supervision;standing Pt Will Perform Lower Body Bathing: with supervision;with adaptive equipment;sit to/from stand Pt Will Perform Lower Body Dressing: with supervision;with adaptive equipment;sit to/from stand Pt Will Transfer to Toilet: with supervision;ambulating;bedside commode (over toilet) Pt Will Perform Toileting - Clothing Manipulation and hygiene: with supervision;sit to/from stand Pt Will Perform Tub/Shower Transfer: Tub transfer;with supervision;ambulating;tub bench;rolling walker  OT Frequency: Min 2X/week   Barriers to D/C: Decreased caregiver support  wife only available initially       Co-evaluation              End of Session Equipment Utilized During Treatment: Gait belt;Rolling walker Nurse Communication: Weight bearing status;Precautions  Activity Tolerance: Patient limited by pain (+nausea) Patient left: in chair;with call bell/phone within reach;with family/visitor present   Time: 1017-1040 OT Time Calculation (min): 23 min Charges:  OT General Charges $OT Visit: 1 Procedure OT Evaluation $OT Eval Moderate Complexity: 1 Procedure OT Treatments $Self Care/Home Management : 8-22 mins G-Codes:     Binnie Kand M.S., OTR/L Pager: [phone]  07/10/2015, 11:14 AM

## 2015-07-10 NOTE — Op Note (Signed)
NAMEWISTER, MOES               ACCOUNT NO.:  [id number]  MEDICAL RECORD NO.:  [id number]  LOCATION:  [id number]                        FACILITY:  [facility]  PHYSICIAN:  Lind Guest. Ninfa Linden, M.D.DATE OF BIRTH:  06-09-1955  DATE OF PROCEDURE:  07/09/2015 DATE OF DISCHARGE:                              OPERATIVE REPORT   PREOPERATIVE DIAGNOSIS:  Severe osteoarthritis and degenerative joint disease, right hip.  POSTOPERATIVE DIAGNOSIS:  Severe osteoarthritis and degenerative joint disease, right hip.  PROCEDURE:  Right total hip arthroplasty through direct anterior approach.  IMPLANTS:  DePuy Sector Gription acetabular component size 54, size 36+ 0 polyethylene liner, size 13 Corail femoral component with varus offset, size 36+ 8.5 ceramic hip ball.  SURGEON:  Lind Guest. Ninfa Linden, MD  ASSISTANT:  Erskine Emery, PA-C  ANESTHESIA: 1. Attempted spinal. 2. General via LMA.  BLOOD LOSS:  150-200 mL.  COMPLICATIONS:  None.  INDICATIONS:  Mr. Aldinger is a very pleasant 60 year old gentleman with debilitating primary osteoarthritis and degenerative joint disease involving his right hip.  He has tried and failed all forms of conservative treatment.  His x-Thurman showed complete loss of the joint space.  He is significantly weak throughout his left side due to previous spinal trauma and he puts all of his weight through his right hip.  His x-rays again showed complete loss of the joint space, sclerotic changes, and periarticular osteophytes.  He is at the point where he says is detrimentally affecting his quality of life, his activities of daily living, his mobility, he does wish to proceed with a total hip arthroplasty.  We talked about the risk of acute blood loss anemia, nerve and vessel injury, fracture, infection, dislocation, DVT. He understands our goals are decreased pain, improved mobility, and overall improved quality of life.  PROCEDURE DESCRIPTION:  After informed  consent was obtained, appropriate right hip was marked.  He was brought to the operating room and spinal anesthesia was obtained while he was on the stretcher.  Traction boots were placed on both his feet.  Next, he was placed supine on the Hana fracture table with the perineal post in place and both legs in inline skeletal traction devices, but no traction applied.  His right operative hip was then prepped and draped with DuraPrep and sterile drapes.  A time-out was called and he was identified as correct patient, correct right hip.  We then made incision inferior and posterior to the anterior superior iliac spine and carried this obliquely down the leg.  We dissected down the tensor fascia lata muscle and the tensor fascia was then divided longitudinally, so we could proceed with a direct anterior approach to the hip.  We identified and cauterized the lateral femoral circumflex vessels and then identified the hip capsule.  We opened up the hip capsule in an L-type format finding significant periarticular osteophytes in the joint effusion.  We were able to place Cobra retractors within the hip capsule and then we made our femoral neck cut with an oscillating saw proximal to the lesser trochanter and completed this on osteotome.  I placed a corkscrew guide in the femoral head and removed the femoral head in its entirety and found it  to be completely devoid of cartilage.  We then placed a bent Hohmann over the medial acetabular rim and removed all remaining labrum and other debris from the hip socket.  We then began reaming under direct visualization from a size 42 reamer up to a size 54 with all reamers under direct visualization, the last reamer also under direct fluoroscopy, so we could obtain our depth of reaming, our inclination, and anteversion. Once we were done with this, we placed the real DePuy Sector Gription acetabular component size 54, and I felt I had great coverage.   We placed a real 36+ 0 polyethylene liner for that size acetabular component.  Attention was then turned to the femur.  With the leg externally rotated to 110 degrees, extended and abducted, we were able to place a Mueller retractor medially and a Hohmann retractor behind the greater trochanter.  We released the lateral joint capsule and used a box cutting osteotome in the inner femoral canal and a rongeur to lateralize.  We then began broaching from a size 8 broach up to a size 13 with a size 13 in place, we trialed a standard offset femoral neck and a 36+ 1.5 hip ball.  We brought the leg back over and up with traction and internal rotation, reducing the pelvis.  We felt like we needed more offset and leg length, so we dislocated and removed the trial components.  We then placed the real size 13 Corail femoral component with varus offset instead a standard offset and a 36+ 5 hip ball.  We rolled the leg back over and up with traction and internal rotation reducing the pelvis and we were pleased with offset and leg length but once he was past 95-100 degrees and I could dislocate him, so we removed that +5 ceramic hip ball and placed +8.5 ceramic hip ball, this improves his leg length stability and offset.  We then irrigated the soft tissue with normal saline solution using pulsatile lavage.  We closed the joint capsule with interrupted #1 Ethibond suture followed by running #1 Vicryl in the tensor fascia, 0 Vicryl in the deep tissue, 2-0 Vicryl in subcutaneous tissue, and 4-0 Monocryl with subcuticular stitch.  Steri-Strips were applied to the skin and Aquacel dressing was placed.  He was then taken off the Hana table, awakened, extubated and taken to the recovery room in stable condition.  All final counts were correct.  There were no complications noted.  Of note, Erskine Emery, PA- C, assisted in entire case.  His assistance was very helpful and crucial for facilitating all aspects of  this case.     Lind Guest. Ninfa Linden, M.D.     CYB/MEDQ  D:  07/09/2015  T:  07/10/2015  Job:  [id number]

## 2015-07-10 NOTE — Care Management (Signed)
Utilization review completed. Isobelle Tuckett, RN Case Manager [phone]. 

## 2015-07-10 NOTE — Progress Notes (Signed)
Inpatient Rehabilitation  Patient was screened by Gerlean Ren for appropriateness for an Inpatient Acute Rehab consult.  At this time, we are not recommending Inpatient Rehab consult.  It does not appear that pt. has medical complexity to justify an IP Rehab stay.  If pt. unable to DC directly home with home care, he may benefit from SNF for rehab.  Please call if questions.  [facility] Admissions Coordinator Cell [phone] Office [phone]

## 2015-07-11 MED ORDER — ASPIRIN 325 MG PO TBEC: 325.0000 mg | DELAYED_RELEASE_TABLET | Freq: Two times a day (BID) | ORAL | Status: DC

## 2015-07-11 MED ORDER — OXYCODONE-ACETAMINOPHEN 5-325 MG PO TABS: 1.0000 | ORAL_TABLET | ORAL | Status: DC | PRN

## 2015-07-11 MED ORDER — METHOCARBAMOL 500 MG PO TABS: 500.0000 mg | ORAL_TABLET | Freq: Four times a day (QID) | ORAL | Status: DC | PRN

## 2015-07-11 NOTE — Progress Notes (Signed)
Pt discharge education and instructions completed with pt and spouse at bedside; both voices understanding and denies any questions. Pt IV removed; pt discharge home with spouse to transport him home. Pt incision dsg remains unchanged, clean and intact. Not active drainage or bleeding noted. Pt handed his prescriptions for robaxin, aspirin and roxicet. Pt discharge home with spouse to transport him home. Pt pre-medicated prior to discharge. Pt home equipments delivered to pt at bedside. P[employer] RN

## 2015-07-11 NOTE — Progress Notes (Signed)
Physical Therapy Treatment Patient Details Name: Evan [name] MRN: [id number] DOB: [birth year] Today's Date: 07/11/2015    History of Present Illness Pt presents for pain right hip, underwent direct anterior THA. 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 Stair training complete. Wife present and actively participating in session. From a mobility standpoint anticipate patient will be ready for DC home when medically ready.     Follow Up Recommendations  Home health PT;Supervision for mobility/OOB     Equipment Recommendations  Rolling walker with 5" wheels;3in1 (PT)    Recommendations for Other Services       Precautions / Restrictions Precautions Precautions: Fall Precaution Comments: Direct anterior hip-no precautions Restrictions Weight Bearing Restrictions: Yes RLE Weight Bearing: Weight bearing as tolerated    Mobility  Bed Mobility Overal bed mobility: Needs Assistance Bed Mobility: Supine to Sit;Sit to Supine Rolling: Supervision Sidelying to sit: Min guard Supine to sit: Min guard Sit to supine: Min guard   General bed mobility comments: min guard for safety; HOB flat with use of bedrail (pt has bedrail at home due to L sided weakness); cues for technique and increased time needed; bed elevated to ~hip level to simulate home  Transfers Overall transfer level: Needs assistance Equipment used: Rolling walker (2 wheeled) Transfers: Sit to/from Stand Sit to Stand: Supervision         General transfer comment: cues for hand placement; supervision for safety; from EOB X 2 and from recliner chair X 2  Ambulation/Gait Ambulation/Gait assistance: Supervision Ambulation Distance (Feet): 120 Feet Assistive device: Rolling walker (2 wheeled) Gait Pattern/deviations: Step-through pattern;Decreased stride length;Decreased stance time - right;Antalgic;Trunk flexed Gait velocity:  decreased   General Gait Details: cues for sequncing, posture, and R heel strike; encouraged pt to increase WB on R LE; slow and steady gait   Stairs Stairs: Yes Stairs assistance: Min guard Stair Management: One rail Right;Sideways;No rails;Backwards;With walker Number of Stairs: 4 General stair comments: educated pt and wife on sequencing and technique; no unsteadiness noted; wife assisting to stabilize RW  Wheelchair Mobility    Modified Rankin (Stroke Patients Only)       Balance Overall balance assessment: Needs assistance Sitting-balance support: No upper extremity supported;Feet supported Sitting balance-Leahy Scale: Good Sitting balance - Comments: pt able to sit and reach without LOB   Standing balance support: No upper extremity supported Standing balance-Leahy Scale: Fair Standing balance comment: Able to maintain balance without UE support for brief period of time during dressing tasks                    Cognition Arousal/Alertness: Awake/alert Behavior During Therapy: WFL for tasks assessed/performed Overall Cognitive Status: Within Functional Limits for tasks assessed                      Exercises Total Joint Exercises Heel Slides: AROM;Right;Supine;10 reps Hip ABduction/ADduction: Right;10 reps;Supine;AROM    General Comments General comments (skin integrity, edema, etc.): educated pt on HEP and use of ice; wife present and actively participating in therapy      Pertinent Vitals/Pain Pain Assessment: Faces Pain Score: 2  Faces Pain Scale: Hurts little more Pain Location: R hip Pain Descriptors / Indicators: Sore Pain Intervention(s): Limited activity within patient's tolerance;Monitored during session;Premedicated before session;Repositioned;Ice applied    Home Living  Prior Function            PT Goals (current goals can now be found in the care plan section) Acute Rehab PT Goals Patient Stated  Goal: return home PT Goal Formulation: With patient Time For Goal Achievement: 07/17/15 Potential to Achieve Goals: Good Progress towards PT goals: Progressing toward goals    Frequency  7X/week    PT Plan Current plan remains appropriate    Co-evaluation             End of Session Equipment Utilized During Treatment: Gait belt Activity Tolerance: Patient tolerated treatment well Patient left: with call bell/phone within reach;with family/visitor present;in chair     Time: [id number] PT Time Calculation (min) (ACUTE ONLY): 33 min  Charges:  $Gait Training: 8-22 mins $Therapeutic Activity: 8-22 mins                    G Codes:      Salina April, PTA Pager: [phone]   07/11/2015, 11:42 AM

## 2015-07-11 NOTE — Progress Notes (Signed)
Pt transported off unit via wheelchair with spouse and belongings to the side. Delia Heady RN

## 2015-07-11 NOTE — Discharge Instructions (Signed)
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
## 2015-07-11 NOTE — Progress Notes (Signed)
Patient ID: Evan [name], male   DOB: 1956/04/14, 60 y.o.   MRN: [id number] Doing very well.  Can be discharged to home today.
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
## 2015-10-01 ENCOUNTER — Other Ambulatory Visit: Payer: Self-pay | Admitting: Neurology

## 2015-10-01 MED ORDER — GABAPENTIN 300 MG PO CAPS: 600.0000 mg | ORAL_CAPSULE | Freq: Two times a day (BID) | ORAL | Status: DC

## 2015-10-01 NOTE — Telephone Encounter (Signed)
Keep pending appointment - refill sent to pharmacy.

## 2015-10-01 NOTE — Telephone Encounter (Signed)
Patient requesting refill of gabapentin (NEURONTIN) 300 MG capsule Pharmacy:[facility] - [place], [place] - [address]  Pt has been out of medication since Saturday.

## 2015-10-23 ENCOUNTER — Encounter: Payer: Self-pay | Admitting: Neurology

## 2015-10-23 ENCOUNTER — Ambulatory Visit (INDEPENDENT_AMBULATORY_CARE_PROVIDER_SITE_OTHER): Payer: Medicaid Other | Admitting: Neurology

## 2015-10-23 VITALS — BP 132/91 | HR 60 | Ht 69.0 in | Wt 204.5 lb

## 2015-10-23 DIAGNOSIS — M5442 Lumbago with sciatica, left side: Secondary | ICD-10-CM | POA: Diagnosis not present

## 2015-10-23 DIAGNOSIS — M545 Low back pain, unspecified: Secondary | ICD-10-CM

## 2015-10-23 MED ORDER — GABAPENTIN 300 MG PO CAPS: 600.0000 mg | ORAL_CAPSULE | Freq: Two times a day (BID) | ORAL | Status: DC
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
## 2016-07-21 ENCOUNTER — Encounter (INDEPENDENT_AMBULATORY_CARE_PROVIDER_SITE_OTHER): Payer: Self-pay

## 2016-07-21 ENCOUNTER — Ambulatory Visit (INDEPENDENT_AMBULATORY_CARE_PROVIDER_SITE_OTHER): Payer: Medicare Other | Admitting: Physician Assistant

## 2016-07-21 ENCOUNTER — Ambulatory Visit (INDEPENDENT_AMBULATORY_CARE_PROVIDER_SITE_OTHER): Payer: Medicare Other

## 2016-07-21 DIAGNOSIS — Z96641 Presence of right artificial hip joint: Secondary | ICD-10-CM
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
## 2016-11-11 ENCOUNTER — Other Ambulatory Visit: Payer: Self-pay | Admitting: Family Medicine

## 2016-11-17 ENCOUNTER — Other Ambulatory Visit: Payer: Self-pay | Admitting: Family Medicine

## 2016-11-17 DIAGNOSIS — F17200 Nicotine dependence, unspecified, uncomplicated: Secondary | ICD-10-CM

## 2016-11-17 DIAGNOSIS — R0602 Shortness of breath: Secondary | ICD-10-CM

## 2016-11-18 ENCOUNTER — Ambulatory Visit
Admission: RE | Admit: 2016-11-18 | Discharge: 2016-11-18 | Disposition: A | Discharge status: Home / Self Care | Payer: Medicare Other | Source: Ambulatory Visit | Attending: Family Medicine | Admitting: Family Medicine

## 2016-11-18 DIAGNOSIS — R0602 Shortness of breath: Secondary | ICD-10-CM

## 2016-11-18 DIAGNOSIS — F17200 Nicotine dependence, unspecified, uncomplicated: Secondary | ICD-10-CM

## 2016-11-18 MED ORDER — IOPAMIDOL (ISOVUE-300) INJECTION 61%
75.0000 mL | Freq: Once | INTRAVENOUS | Status: AC | PRN
  Administered 2016-11-18: 75 mL via INTRAVENOUS

## 2016-12-29 ENCOUNTER — Other Ambulatory Visit: Payer: Self-pay | Admitting: Neurology

## 2017-12-17 ENCOUNTER — Encounter (HOSPITAL_COMMUNITY): Payer: Self-pay

## 2017-12-17 ENCOUNTER — Emergency Department (HOSPITAL_COMMUNITY): Payer: Medicare HMO

## 2017-12-17 ENCOUNTER — Emergency Department (HOSPITAL_BASED_OUTPATIENT_CLINIC_OR_DEPARTMENT_OTHER): Payer: Medicare HMO

## 2017-12-17 ENCOUNTER — Observation Stay (HOSPITAL_COMMUNITY)
Admission: EM | Admit: 2017-12-17 | Discharge: 2017-12-19 | Disposition: A | Discharge status: Home / Self Care | Payer: Medicare HMO | Attending: Internal Medicine | Admitting: Internal Medicine

## 2017-12-17 ENCOUNTER — Other Ambulatory Visit: Payer: Self-pay

## 2017-12-17 DIAGNOSIS — I82411 Acute embolism and thrombosis of right femoral vein: Secondary | ICD-10-CM | POA: Diagnosis not present

## 2017-12-17 DIAGNOSIS — F17210 Nicotine dependence, cigarettes, uncomplicated: Secondary | ICD-10-CM | POA: Insufficient documentation

## 2017-12-17 DIAGNOSIS — F329 Major depressive disorder, single episode, unspecified: Secondary | ICD-10-CM | POA: Insufficient documentation

## 2017-12-17 DIAGNOSIS — Z79899 Other long term (current) drug therapy: Secondary | ICD-10-CM | POA: Insufficient documentation

## 2017-12-17 DIAGNOSIS — N179 Acute kidney failure, unspecified: Secondary | ICD-10-CM | POA: Diagnosis present

## 2017-12-17 DIAGNOSIS — Z7901 Long term (current) use of anticoagulants: Secondary | ICD-10-CM | POA: Diagnosis not present

## 2017-12-17 DIAGNOSIS — K219 Gastro-esophageal reflux disease without esophagitis: Secondary | ICD-10-CM | POA: Diagnosis not present

## 2017-12-17 DIAGNOSIS — J449 Chronic obstructive pulmonary disease, unspecified: Secondary | ICD-10-CM | POA: Insufficient documentation

## 2017-12-17 DIAGNOSIS — I951 Orthostatic hypotension: Secondary | ICD-10-CM | POA: Diagnosis not present

## 2017-12-17 DIAGNOSIS — I10 Essential (primary) hypertension: Secondary | ICD-10-CM | POA: Diagnosis present

## 2017-12-17 DIAGNOSIS — Z885 Allergy status to narcotic agent status: Secondary | ICD-10-CM | POA: Insufficient documentation

## 2017-12-17 DIAGNOSIS — F419 Anxiety disorder, unspecified: Secondary | ICD-10-CM | POA: Insufficient documentation

## 2017-12-17 DIAGNOSIS — J181 Lobar pneumonia, unspecified organism: Secondary | ICD-10-CM | POA: Diagnosis not present

## 2017-12-17 DIAGNOSIS — M79609 Pain in unspecified limb: Secondary | ICD-10-CM | POA: Diagnosis not present

## 2017-12-17 DIAGNOSIS — R911 Solitary pulmonary nodule: Secondary | ICD-10-CM | POA: Diagnosis not present

## 2017-12-17 DIAGNOSIS — K760 Fatty (change of) liver, not elsewhere classified: Secondary | ICD-10-CM | POA: Insufficient documentation

## 2017-12-17 DIAGNOSIS — I2510 Atherosclerotic heart disease of native coronary artery without angina pectoris: Secondary | ICD-10-CM | POA: Insufficient documentation

## 2017-12-17 DIAGNOSIS — J189 Pneumonia, unspecified organism: Secondary | ICD-10-CM | POA: Diagnosis present

## 2017-12-17 DIAGNOSIS — Z87442 Personal history of urinary calculi: Secondary | ICD-10-CM | POA: Insufficient documentation

## 2017-12-17 DIAGNOSIS — N189 Chronic kidney disease, unspecified: Secondary | ICD-10-CM | POA: Insufficient documentation

## 2017-12-17 DIAGNOSIS — I129 Hypertensive chronic kidney disease with stage 1 through stage 4 chronic kidney disease, or unspecified chronic kidney disease: Secondary | ICD-10-CM | POA: Diagnosis not present

## 2017-12-17 DIAGNOSIS — M1990 Unspecified osteoarthritis, unspecified site: Secondary | ICD-10-CM | POA: Insufficient documentation

## 2017-12-17 DIAGNOSIS — M21372 Foot drop, left foot: Secondary | ICD-10-CM | POA: Insufficient documentation

## 2017-12-17 DIAGNOSIS — I82431 Acute embolism and thrombosis of right popliteal vein: Secondary | ICD-10-CM | POA: Diagnosis not present

## 2017-12-17 DIAGNOSIS — E876 Hypokalemia: Secondary | ICD-10-CM | POA: Diagnosis not present

## 2017-12-17 DIAGNOSIS — G4733 Obstructive sleep apnea (adult) (pediatric): Secondary | ICD-10-CM | POA: Insufficient documentation

## 2017-12-17 DIAGNOSIS — I700 Atherosclerosis of aorta: Secondary | ICD-10-CM | POA: Insufficient documentation

## 2017-12-17 DIAGNOSIS — M7989 Other specified soft tissue disorders: Secondary | ICD-10-CM | POA: Diagnosis not present

## 2017-12-17 DIAGNOSIS — M21332 Wrist drop, left wrist: Secondary | ICD-10-CM | POA: Insufficient documentation

## 2017-12-17 LAB — URINALYSIS, ROUTINE W REFLEX MICROSCOPIC
BILIRUBIN URINE: NEGATIVE
GLUCOSE, UA: NEGATIVE mg/dL
Hgb urine dipstick: NEGATIVE
Ketones, ur: NEGATIVE mg/dL
Leukocytes, UA: NEGATIVE
NITRITE: NEGATIVE
PH: 5 (ref 5.0–8.0)
Protein, ur: NEGATIVE mg/dL
SPECIFIC GRAVITY, URINE: 1.012 (ref 1.005–1.030)

## 2017-12-17 LAB — COMPREHENSIVE METABOLIC PANEL
ALBUMIN: 3.3 g/dL — AB (ref 3.5–5.0)
ALT: 52 U/L — ABNORMAL HIGH (ref 0–44)
ANION GAP: 10 (ref 5–15)
AST: 48 U/L — ABNORMAL HIGH (ref 15–41)
Alkaline Phosphatase: 54 U/L (ref 38–126)
BUN: 20 mg/dL (ref 8–23)
CHLORIDE: 99 mmol/L (ref 98–111)
CO2: 29 mmol/L (ref 22–32)
Calcium: 9.1 mg/dL (ref 8.9–10.3)
Creatinine, Ser: 1.56 mg/dL — ABNORMAL HIGH (ref 0.61–1.24)
GFR calc Af Amer: 53 mL/min — ABNORMAL LOW (ref >60)
GFR calc non Af Amer: 46 mL/min — ABNORMAL LOW (ref >60)
GLUCOSE: 105 mg/dL — AB (ref 70–99)
POTASSIUM: 3.3 mmol/L — AB (ref 3.5–5.1)
SODIUM: 138 mmol/L (ref 135–145)
TOTAL PROTEIN: 6 g/dL — AB (ref 6.5–8.1)
Total Bilirubin: 0.5 mg/dL (ref 0.3–1.2)

## 2017-12-17 LAB — CBC WITH DIFFERENTIAL/PLATELET
Abs Immature Granulocytes: 0 10*3/uL (ref 0.0–0.1)
Basophils Absolute: 0.1 10*3/uL (ref 0.0–0.1)
Basophils Relative: 1 %
EOS ABS: 0.1 10*3/uL (ref 0.0–0.7)
EOS PCT: 1 %
HCT: 41.4 % (ref 39.0–52.0)
Hemoglobin: 13 g/dL (ref 13.0–17.0)
Immature Granulocytes: 1 %
LYMPHS ABS: 1.2 10*3/uL (ref 0.7–4.0)
LYMPHS PCT: 22 %
MCH: 28.3 pg (ref 26.0–34.0)
MCHC: 31.4 g/dL (ref 30.0–36.0)
MCV: 90.2 fL (ref 78.0–100.0)
Monocytes Absolute: 0.8 10*3/uL (ref 0.1–1.0)
Monocytes Relative: 15 %
NEUTROS PCT: 60 %
Neutro Abs: 3.2 10*3/uL (ref 1.7–7.7)
Platelets: 132 10*3/uL — ABNORMAL LOW (ref 150–400)
RBC: 4.59 MIL/uL (ref 4.22–5.81)
RDW: 13.3 % (ref 11.5–15.5)
WBC: 5.3 10*3/uL (ref 4.0–10.5)

## 2017-12-17 LAB — BRAIN NATRIURETIC PEPTIDE: B NATRIURETIC PEPTIDE 5: 9.4 pg/mL (ref 0.0–100.0)

## 2017-12-17 LAB — I-STAT TROPONIN, ED
TROPONIN I, POC: 0 ng/mL (ref 0.00–0.08)
TROPONIN I, POC: 0.02 ng/mL (ref 0.00–0.08)

## 2017-12-17 LAB — PROTIME-INR
INR: 1.03
Prothrombin Time: 13.4 seconds (ref 11.4–15.2)

## 2017-12-17 LAB — D-DIMER, QUANTITATIVE (NOT AT ARMC): D DIMER QUANT: 1.65 ug{FEU}/mL — AB (ref 0.00–0.50)

## 2017-12-17 LAB — TSH: TSH: 1.151 u[IU]/mL (ref 0.350–4.500)

## 2017-12-17 LAB — LIPASE, BLOOD: Lipase: 41 U/L (ref 11–51)

## 2017-12-17 LAB — MAGNESIUM: Magnesium: 1.6 mg/dL — ABNORMAL LOW (ref 1.7–2.4)

## 2017-12-17 LAB — I-STAT CG4 LACTIC ACID, ED: Lactic Acid, Venous: 0.91 mmol/L (ref 0.5–1.9)

## 2017-12-17 MED ORDER — SODIUM CHLORIDE 0.9 % IV SOLN
1.0000 g | INTRAVENOUS | Status: DC
  Administered 2017-12-18: 1 g via INTRAVENOUS
  Filled 2017-12-17 (×2): qty 10

## 2017-12-17 MED ORDER — SODIUM CHLORIDE 0.9 % IV SOLN
INTRAVENOUS | Status: DC | PRN
  Administered 2017-12-17: 500 mL via INTRAVENOUS

## 2017-12-17 MED ORDER — GABAPENTIN 300 MG PO CAPS
600.0000 mg | ORAL_CAPSULE | Freq: Two times a day (BID) | ORAL | Status: DC
  Administered 2017-12-17 – 2017-12-19 (×4): 600 mg via ORAL
  Filled 2017-12-17 (×4): qty 2

## 2017-12-17 MED ORDER — HYDROCODONE-ACETAMINOPHEN 5-325 MG PO TABS
1.0000 | ORAL_TABLET | Freq: Four times a day (QID) | ORAL | Status: DC | PRN
  Administered 2017-12-17 – 2017-12-19 (×4): 1 via ORAL
  Filled 2017-12-17 (×4): qty 1

## 2017-12-17 MED ORDER — SODIUM CHLORIDE 0.9 % IV SOLN
500.0000 mg | Freq: Once | INTRAVENOUS | Status: AC
  Administered 2017-12-18: 500 mg via INTRAVENOUS
  Filled 2017-12-17: qty 500

## 2017-12-17 MED ORDER — LORATADINE 10 MG PO TABS
10.0000 mg | ORAL_TABLET | Freq: Every day | ORAL | Status: DC
  Administered 2017-12-18 – 2017-12-19 (×2): 10 mg via ORAL
  Filled 2017-12-17 (×2): qty 1

## 2017-12-17 MED ORDER — PANTOPRAZOLE SODIUM 40 MG PO TBEC
80.0000 mg | DELAYED_RELEASE_TABLET | Freq: Every day | ORAL | Status: DC
  Administered 2017-12-18 – 2017-12-19 (×2): 80 mg via ORAL
  Filled 2017-12-17 (×2): qty 2

## 2017-12-17 MED ORDER — HEPARIN BOLUS VIA INFUSION
5000.0000 [IU] | Freq: Once | INTRAVENOUS | Status: AC
  Administered 2017-12-17: 5000 [IU] via INTRAVENOUS
  Filled 2017-12-17: qty 5000

## 2017-12-17 MED ORDER — SODIUM CHLORIDE 0.9 % IV SOLN
1.0000 g | Freq: Once | INTRAVENOUS | Status: AC
  Administered 2017-12-17: 1 g via INTRAVENOUS
  Filled 2017-12-17: qty 10

## 2017-12-17 MED ORDER — ALPRAZOLAM 0.5 MG PO TABS
0.5000 mg | ORAL_TABLET | Freq: Three times a day (TID) | ORAL | Status: DC | PRN
  Administered 2017-12-17 – 2017-12-18 (×2): 0.5 mg via ORAL
  Filled 2017-12-17 (×2): qty 1

## 2017-12-17 MED ORDER — SODIUM CHLORIDE 0.9 % IV SOLN
500.0000 mg | INTRAVENOUS | Status: DC
  Administered 2017-12-18: 500 mg via INTRAVENOUS
  Filled 2017-12-17 (×2): qty 500

## 2017-12-17 MED ORDER — HEPARIN (PORCINE) IN NACL 100-0.45 UNIT/ML-% IJ SOLN
1400.0000 [IU]/h | INTRAMUSCULAR | Status: DC
  Administered 2017-12-17: 1500 [IU]/h via INTRAVENOUS
  Filled 2017-12-17 (×2): qty 250

## 2017-12-17 MED ORDER — SODIUM CHLORIDE 0.9 % IV SOLN
INTRAVENOUS | Status: DC
  Administered 2017-12-18: 01:00:00 via INTRAVENOUS

## 2017-12-17 MED ORDER — IOPAMIDOL (ISOVUE-370) INJECTION 76%
INTRAVENOUS | Status: AC
  Filled 2017-12-17: qty 100

## 2017-12-17 MED ORDER — FLUOXETINE HCL 10 MG PO CAPS
10.0000 mg | ORAL_CAPSULE | Freq: Every day | ORAL | Status: DC
  Administered 2017-12-17 – 2017-12-18 (×2): 10 mg via ORAL
  Filled 2017-12-17 (×2): qty 1

## 2017-12-17 MED ORDER — IOPAMIDOL (ISOVUE-370) INJECTION 76%
100.0000 mL | Freq: Once | INTRAVENOUS | Status: AC | PRN
  Administered 2017-12-17: 100 mL via INTRAVENOUS

## 2017-12-17 MED ORDER — MAGNESIUM SULFATE 2 GM/50ML IV SOLN
2.0000 g | Freq: Once | INTRAVENOUS | Status: AC
  Administered 2017-12-17: 2 g via INTRAVENOUS
  Filled 2017-12-17: qty 50
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
## 2017-12-17 NOTE — ED Notes (Signed)
Two unsuccessful IV attempts. IV consult ordered.

## 2017-12-17 NOTE — Progress Notes (Signed)
Bilateral lower extremity venous duplex completed - Preliminary results - Right - Positive for and acute DVT of the posterior tibial vein mid calf coursing through the popliteal, and femoral veins. No evidence of a Baker's cyst. Left - There is no evidence of DVT or Baker's cyst. Toma Copier, RVS 12/17/2017, 4:27 PM

## 2017-12-17 NOTE — ED Triage Notes (Signed)
Hr 70 95% RA 123/68 22 rr

## 2017-12-17 NOTE — ED Provider Notes (Signed)
[facility] EMERGENCY DEPARTMENT Provider Note   CSN: [id number] Arrival date & time: 12/17/17  1416     History   Chief Complaint Chief Complaint  Patient presents with  . Hypotension    orthostatic    HPI Evan [name] is a 62 y.o. male.  The history is provided by the patient, medical records and the spouse. No language interpreter was used.  Chest Pain   This is a new problem. The current episode started more than 1 week ago. The problem occurs daily. The problem has not changed since onset.The pain is associated with exertion. The pain is present in the substernal region. The pain is moderate. The quality of the pain is described as exertional and dull. The pain does not radiate. The symptoms are aggravated by exertion. Associated symptoms include cough, exertional chest pressure, lower extremity edema, malaise/fatigue, shortness of breath and sputum production. Pertinent negatives include no abdominal pain, no back pain, no diaphoresis, no dizziness, no fever, no headaches, no irregular heartbeat, no leg pain, no nausea, no numbness, no palpitations, no syncope, no vomiting and no weakness. He has tried nothing for the symptoms. The treatment provided no relief.    Past Medical History:  Diagnosis Date  . Anxiety   . Arthritis   . Chronic kidney disease    kidney stones  . Depression   . GERD (gastroesophageal reflux disease)   . Hypertension   . Neck pain   . 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 Right-sided low back pain with left-sided sciatica   . Seizures ([place])    no seizures in many years  . Shortness of breath dyspnea    with  exertion  . Sleep apnea    last sleep study more than 5 years  uses CPAP even when napping    Patient Active Problem List   Diagnosis Date Noted  . Osteoarthritis of right hip 07/09/2015  . 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  Right-sided low back pain with left-sided sciatica   . Low back pain 09/20/2013  . Neck pain 09/20/2013  . Alteration consciousness 09/20/2013    Past Surgical History:  Procedure Laterality Date  . kidney stones    . Left leg    . NECK SURGERY     4-5--6 and 7  . TOTAL HIP ARTHROPLASTY Right 07/09/2015   Procedure: RIGHT TOTAL HIP ARTHROPLASTY ANTERIOR APPROACH;  Surgeon: Mcarthur Rossetti, MD;  Location: [facility];  Service: Orthopedics;  Laterality: Right;        Home Medications    Prior to Admission medications   Medication Sig Start Date End Date Taking? Authorizing Provider  ALPRAZolam Duanne Moron) 0.5 MG tablet Take 0.5 mg by mouth 3 (three) times daily as needed for anxiety.     [provider]  esomeprazole (NEXIUM) 40 MG capsule Take 40 mg by mouth daily at 12 noon.    [provider]  FLUoxetine (PROZAC) 10 MG capsule Take 10 mg by mouth daily.    [provider]  gabapentin (NEURONTIN) 300 MG capsule Take 2 capsules (600 mg total) by mouth 2 (two) times daily. 10/23/15   Marcial Pacas, MD  lisinopril (PRINIVIL,ZESTRIL) 20 MG tablet Take 20 mg by mouth daily.    [provider]  metoprolol succinate (TOPROL-XL) 25 MG 24 hr tablet Take 25 mg by mouth daily.    [provider]  oxyCODONE-acetaminophen (ROXICET) 5-325 MG tablet Take 1-2 tablets by mouth every 4 (four) hours as needed. 07/11/15   Mcarthur Rossetti, MD    Family History Family History  Problem Relation Age of Onset  . Hepatitis Mother   . Alzheimer's disease Father     Social History Social History   Tobacco Use  . Smoking status: Current Every Day Smoker    Packs/day: 2.00    Years: 45.00    Pack years: 90.00    Types: Cigarettes  . Smokeless tobacco: Never Used  Substance Use Topics  . Alcohol use: Yes    Alcohol/week: 1.0 - 2.0 standard drinks    Types: 1 - 2 Shots of liquor per week    Comment: daily   . Drug use: No     Allergies     Buprenorphine hcl and Morphine and related   Review of Systems Review of Systems  Constitutional: Positive for fatigue and malaise/fatigue. Negative for chills, diaphoresis and fever.  HENT: Negative for congestion.   Eyes: Negative for visual disturbance.  Respiratory: Positive for cough, sputum production and shortness of breath. Negative for chest tightness, wheezing and stridor.   Cardiovascular: Positive for chest pain. Negative for palpitations, leg swelling and syncope.  Gastrointestinal: Negative for abdominal pain, constipation, diarrhea, nausea and vomiting.  Genitourinary: Positive for decreased urine volume. Negative for dysuria, flank pain and frequency.  Musculoskeletal: Negative for back pain, neck pain and neck stiffness.  Skin: Negative for rash and wound.  Neurological: Positive for light-headedness. Negative for dizziness, syncope, weakness, numbness and headaches.  Psychiatric/Behavioral: Negative for agitation.  All other systems reviewed and are negative.    Physical Exam Updated Vital Signs BP 98/69 (BP Location: Right Arm)   Pulse 68   Temp 98.6 F (37 C) (Oral)   Resp 16   Ht 5\' 8"  (1.727 m)   Wt 101.6 kg   SpO2 98%   BMI 34.06 kg/m   Physical Exam  Constitutional: He appears well-developed and well-nourished. No distress.  HENT:  Head: Normocephalic and atraumatic.  Nose: Nose normal.  Mouth/Throat: Oropharynx is clear and moist. No oropharyngeal exudate.  Eyes: Pupils are equal, round, and reactive to light. Conjunctivae and EOM are normal.  Neck: Normal range of motion. Neck supple.  Cardiovascular: Normal rate and regular rhythm.  No murmur heard. Pulmonary/Chest: No tachypnea. No respiratory distress. He has no wheezes. He has rhonchi. He has rales. He exhibits no tenderness.  Abdominal: Soft. He exhibits no distension. There is no tenderness.  Musculoskeletal: He exhibits edema. He exhibits no tenderness.  Neurological: He is alert. No  sensory deficit. He exhibits normal muscle tone.  Skin: Skin is warm and dry. He is not diaphoretic. No erythema. No pallor.  Psychiatric: He has a normal mood and affect.  Nursing note and vitals reviewed.    ED Treatments / Results  Labs (all labs ordered are listed, but only abnormal results are displayed) Labs Reviewed  CBC WITH DIFFERENTIAL/PLATELET - Abnormal; Notable for the following components:      Result Value   Platelets 132 (*)    All other components within normal limits  COMPREHENSIVE METABOLIC PANEL - Abnormal; Notable for the following components:   Potassium 3.3 (*)    Glucose, Bld 105 (*)    Creatinine, Ser 1.56 (*)    Total Protein 6.0 (*)    Albumin 3.3 (*)    AST 48 (*)    ALT 52 (*)    GFR calc  non Af Amer 46 (*)    GFR calc Af Amer 53 (*)    All other components within normal limits  D-DIMER, QUANTITATIVE (NOT AT [facility]) - Abnormal; Notable for the following components:   D-Dimer, Quant 1.65 (*)    All other components within normal limits  MAGNESIUM - Abnormal; Notable for the following components:   Magnesium 1.6 (*)    All other components within normal limits  URINE CULTURE  CULTURE, BLOOD (ROUTINE X 2)  CULTURE, BLOOD (ROUTINE X 2)  GRAM STAIN  EXPECTORATED SPUTUM ASSESSMENT W REFEX TO RESP CULTURE  LIPASE, BLOOD  PROTIME-INR  BRAIN NATRIURETIC PEPTIDE  URINALYSIS, ROUTINE W REFLEX MICROSCOPIC  TSH  HIV ANTIBODY (ROUTINE TESTING)  STREP PNEUMONIAE URINARY ANTIGEN  CBC  BASIC METABOLIC PANEL  HEPARIN LEVEL (UNFRACTIONATED)  I-STAT TROPONIN, ED  I-STAT CG4 LACTIC ACID, ED  I-STAT CG4 LACTIC ACID, ED  I-STAT TROPONIN, ED  I-STAT TROPONIN, ED    EKG EKG Interpretation  Date/Time:  Friday December 17 2017 14:17:53 EDT Ventricular Rate:  65 PR Interval:    QRS Duration: 114 QT Interval:  418 QTC Calculation: 435 R Axis:   79 Text Interpretation:  Sinus rhythm Borderline intraventricular conduction delay Minimal ST depression,  anterolateral leads When compared to prior, no significant changes sen,  No STEMI Confirmed by Antony Blackbird [phone]) on 12/17/2017 2:28:03 PM   Radiology Dg Chest 2 View  Result Date: 12/17/2017 CLINICAL DATA:  Hypertension and weakness with dyspnea EXAM: CHEST - 2 VIEW COMPARISON:  CT 11/18/2016 and CXR 05/30/2003 FINDINGS: Heart size is normal. There is moderate aortic atherosclerosis at the arch without aneurysmal dilatation. Lungs are clear. Remote fracture deformity of the left distal clavicle. Bilateral healed fractures are also redemonstrated. No acute osseous abnormality. IMPRESSION: No active cardiopulmonary disease. Aortic atherosclerosis. Remote left clavicular and bilateral rib fractures. Electronically Signed   By: Ashley Royalty M.D.   On: 12/17/2017 17:11   Ct Angio Chest Pe W And/or Wo Contrast  Result Date: 12/17/2017 CLINICAL DATA:  Dyspnea for years.  Lower extremity swelling. EXAM: CT ANGIOGRAPHY CHEST WITH CONTRAST TECHNIQUE: Multidetector CT imaging of the chest was performed using the standard protocol during bolus administration of intravenous contrast. Multiplanar CT image reconstructions and MIPs were obtained to evaluate the vascular anatomy. CONTRAST:  160mL ISOVUE-370 IOPAMIDOL (ISOVUE-370) INJECTION 76% COMPARISON:  CXR 12/21/2017, chest CT 11/18/2016 FINDINGS: Cardiovascular: Conventional branch pattern of the great vessels. Preferential enhancement of the pulmonary arteries without acute pulmonary embolus to the segmental level. Nonaneurysmal minimally atherosclerotic aorta without dissection. Heart size is top-normal. No pericardial effusion. Minimal atherosclerosis of the LAD. Mediastinum/Nodes: No enlarged mediastinal, hilar, or axillary lymph nodes. Thyroid gland, trachea, and esophagus demonstrate no significant findings. Lungs/Pleura: Lung windows are slightly compromised by respiratory motion artifacts. There is mild peribronchial thickening with small acinar opacities  in the upper lobes, greater in the right upper lobe suspicious for minimal foci of bronchopneumonia. Stable 4 mm nodular density in the left upper lobe. Minimal ground-glass opacities in the region of the lingula. Upper Abdomen: Hepatic steatosis. No adrenal mass. No acute abnormality. Musculoskeletal: No aggressive osseous lesions or fracture. Degenerative changes are present along the dorsal spine with diffuse idiopathic skeletal hyperostosis noted. Review of the MIP images confirms the above findings. IMPRESSION: Nonspecific acinar opacities in the right upper lobe that may reflect stigmata of bronchopneumonia, aspiration or vasculitis among some considerations. Consider short-term interval follow-up in 3 months to assure resolution and to exclude pulmonary nodules. Minimal  coronary arteriosclerosis and aortic atherosclerosis. No acute pulmonary embolus. Stable 4 mm nodular density in the left upper lobe unchanged. Electronically Signed   By: Ashley Royalty M.D.   On: 12/17/2017 18:23    Procedures Procedures (including critical care time)  CRITICAL CARE Performed by: Gwenyth Allegra Tegeler Total critical care time: 35 minutes Critical care time was exclusive of separately billable procedures and treating other patients. Critical care was necessary to treat or prevent imminent or life-threatening deterioration. Critical care was time spent personally by me on the following activities: development of treatment plan with patient and/or surrogate as well as nursing, discussions with consultants, evaluation of patient's response to treatment, examination of patient, obtaining history from patient or surrogate, ordering and performing treatments and interventions, ordering and review of laboratory studies, ordering and review of radiographic studies, pulse oximetry and re-evaluation of patient's condition.   Medications Ordered in ED Medications  iopamidol (ISOVUE-370) 76 % injection (has no administration  in time range)  azithromycin (ZITHROMAX) 500 mg in sodium chloride 0.9 % 250 mL IVPB (has no administration in time range)  ALPRAZolam (XANAX) tablet 0.5 mg (0.5 mg Oral Given 12/17/17 2256)  gabapentin (NEURONTIN) capsule 600 mg (600 mg Oral Given 12/17/17 2257)  pantoprazole (PROTONIX) EC tablet 80 mg (has no administration in time range)  FLUoxetine (PROZAC) capsule 10 mg (10 mg Oral Given 12/17/17 2256)  loratadine (CLARITIN) tablet 10 mg (has no administration in time range)  HYDROcodone-acetaminophen (NORCO/VICODIN) 5-325 MG per tablet 1 tablet (1 tablet Oral Given 12/17/17 2256)  0.9 %  sodium chloride infusion (has no administration in time range)  cefTRIAXone (ROCEPHIN) 1 g in sodium chloride 0.9 % 100 mL IVPB (has no administration in time range)  azithromycin (ZITHROMAX) 500 mg in sodium chloride 0.9 % 250 mL IVPB (has no administration in time range)  0.9 %  sodium chloride infusion (500 mLs Intravenous New Bag/Given 12/17/17 2107)  heparin ADULT infusion 100 units/mL (25000 units/266mL sodium chloride 0.45%) (1,500 Units/hr Intravenous New Bag/Given 12/17/17 2244)  iopamidol (ISOVUE-370) 76 % injection 100 mL (100 mLs Intravenous Contrast Given 12/17/17 1759)  cefTRIAXone (ROCEPHIN) 1 g in sodium chloride 0.9 % 100 mL IVPB (0 g Intravenous Stopped 12/17/17 2251)  magnesium sulfate IVPB 2 g 50 mL (2 g Intravenous New Bag/Given 12/17/17 2249)  heparin bolus via infusion 5,000 Units (5,000 Units Intravenous Bolus from Bag 12/17/17 2327)     Initial Impression / Assessment and Plan / ED Course  I have reviewed the triage vital signs and the nursing notes.  Pertinent labs & imaging results that were available during my care of the patient were reviewed by me and considered in my medical decision making (see chart for details).     Evan [name] is a 62 y.o. male with a past medical history significant for chronic left-sided weakness from a prior accident, CKD, hypertension, seizures, GERD,  and sleep apnea on CPAP at night who presents from urgent care with several complaints including hypotension, chest pain, shortness of breath, cough, peripheral edema, and fatigue.  Patient reports that for the last several days he has been having intermittent exertional chest discomfort.  He reports it as a deep aching.  He reports it is worsened when he is weed eating or exerting himself.  He reports associated shortness of breath.  He reports that he feels lightheaded at times and today when at the urgent care for evaluation his blood pressure was found to be 60/40 while checking orthostatics.  He says that his legs have been swollen bilaterally recently and his PCP started him on Lasix.  He says that he had a recent long drive to the beach and his legs have been more swollen since that trip.  He reports that he has had decrease in urination but denies any dysuria constipation or diarrhea.  He reports that he is also had worsened shortness breath when he tries to lay flat.  He says that he has had several weeks of productive cough that has been worsening.  He reports that a chest x-[place] previously showed no pneumonia.    Family reports that a year ago he had a CT scan showing several abnormalities including pulmonary nodules however he was not informed this until recently.  On exam, patient had some crackles in the bases of his lungs.  Rhonchi also appreciated.  Chest is nontender.  Faint murmur was appreciated.  Abdomen was nontender.  Patient has symmetric pulses in his lower extremity however there was more swelling in the right lower extremities in the left.  Back was nontender.  Abdomen nontender.  Chest nontender.  Clinically I am concerned about fluid overload.  Although the patient was hypotensive with a blood pressure of 60/40, I would like to see some of his lab work before for letting him with fluids.  Patient will have repeat chest x-[place] as well as work-up to look for DVT with ultrasound and a  d-dimer.  Anticipate reassessment after work-up.  Heart score calculated as a 5 initially.  4:25 PM DVT ultrasound was positive for acute DVT in the right leg up to the femoral vein.  Will await kidney function however will likely order PE study.  If GFR is too low, will likely need a V/Q.  8:28 PM PE study was negative for pulmonary embolism however there was evidence of pneumonia.  Given the patient's new DVT, bronchopneumonia causing chest pain, shortness of breath and hypotension, and his AKI, patient will be admitted for further management.  Patient will be started on heparin, be given antibiotics, and will need to be monitored for his AKI.  Next  Hospitalist team called for admission.  Final Clinical Impressions(s) / ED Diagnoses   Final diagnoses:  Acute deep vein thrombosis (DVT) of femoral vein of right lower extremity ([place])  Community acquired pneumonia, unspecified laterality  AKI (acute kidney injury) [facility])    ED Discharge Orders    None     Clinical Impression: 1. Acute deep vein thrombosis (DVT) of femoral vein of right lower extremity (H[facility])   2. Community acquired pneumonia, unspecified laterality   3. AKI (acute kidney injury) ([place])     Disposition: Admit  This note was prepared with assistance of Dragon voice recognition software. Occasional wrong-word or sound-a-like substitutions may have occurred due to the inherent limitations of voice recognition software.     Tegeler, Gwenyth Allegra, MD 12/18/17 [phone]

## 2017-12-17 NOTE — Progress Notes (Signed)
ANTICOAGULATION CONSULT NOTE - Initial Consult  Pharmacy Consult for heparin Indication: DVT  Allergies  Allergen Reactions  . Buprenorphine Hcl Nausea And Vomiting  . Morphine And Related Nausea And Vomiting    Patient Measurements: Height: 5\' 8"  (172.7 cm) Weight: 224 lb (101.6 kg) IBW/kg (Calculated) : 68.4 Heparin Dosing Weight: 90.8 Kg  Vital Signs: Temp: 98.6 F (37 C) (08/23 1436) Temp Source: Oral (08/23 1436) BP: 117/92 (08/23 1836) Pulse Rate: 65 (08/23 1836)  Labs: Recent Labs    12/17/17 1505  HGB 13.0  HCT 41.4  PLT 132*  LABPROT 13.4  INR 1.03  CREATININE 1.56*    Estimated Creatinine Clearance: 56 mL/min (A) (by C-G formula based on SCr of 1.56 mg/dL (H)).   Medical History: Past Medical History:  Diagnosis Date  . Anxiety   . Arthritis   . Chronic kidney disease    kidney stones  . Depression   . GERD (gastroesophageal reflux disease)   . Hypertension   . Neck pain   . 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 Right-sided low back pain with left-sided sciatica   . Seizures ([place])    no seizures in many years  . Shortness of breath dyspnea    with  exertion  . Sleep apnea    last sleep study more than 5 years  uses CPAP even when napping    Assessment: 89 yoM with lower extremity DVT. No anticoagulation reported PTA. Hgb 13.0, PLT 132. Pharmacy to dose heparin infusion.   Goal of Therapy:  Heparin level 0.3-0.7 units/ml Monitor platelets by anticoagulation protocol: Yes   Plan:  Give 5000 units bolus x 1 Start heparin infusion at 1500 units/hr Check anti-Xa level in 6 hours and daily while on heparin Continue to monitor H&H and platelets  Georga Bora, PharmD Clinical Pharmacist 12/17/2017 9:24 PM Please check AMION for all [facility] numbers

## 2017-12-17 NOTE — ED Triage Notes (Signed)
[facility] reports pt from UC, SOB and cough w/ clear mucous, x 2days, fatigued, Took lasix on T/W new RX  + for ortho at [facility] - for ortho with [facility] - c xr

## 2017-12-17 NOTE — ED Notes (Signed)
US at bedside

## 2017-12-17 NOTE — ED Notes (Signed)
Patient transported to CT 

## 2017-12-17 NOTE — Plan of Care (Signed)
  Problem: Education: Goal: Knowledge of General Education information will improve Description: Including pain rating scale, medication(s)/side effects and non-pharmacologic comfort measures Outcome: Progressing   Problem: Health Behavior/Discharge Planning: Goal: Ability to manage health-related needs will improve Outcome: Progressing   Problem: Clinical Measurements: Goal: Ability to maintain clinical measurements within normal limits will improve Outcome: Progressing   Problem: Activity: Goal: Risk for activity intolerance will decrease Outcome: Progressing   Problem: Pain Managment: Goal: General experience of comfort will improve Outcome: Progressing   Problem: Safety: Goal: Ability to remain free from injury will improve Outcome: Progressing   

## 2017-12-18 DIAGNOSIS — I10 Essential (primary) hypertension: Secondary | ICD-10-CM

## 2017-12-18 DIAGNOSIS — J181 Lobar pneumonia, unspecified organism: Secondary | ICD-10-CM | POA: Diagnosis not present

## 2017-12-18 DIAGNOSIS — N179 Acute kidney failure, unspecified: Secondary | ICD-10-CM | POA: Diagnosis not present

## 2017-12-18 DIAGNOSIS — J441 Chronic obstructive pulmonary disease with (acute) exacerbation: Secondary | ICD-10-CM

## 2017-12-18 LAB — CBC
HCT: 40 % (ref 39.0–52.0)
Hemoglobin: 12.6 g/dL — ABNORMAL LOW (ref 13.0–17.0)
MCH: 27.9 pg (ref 26.0–34.0)
MCHC: 31.5 g/dL (ref 30.0–36.0)
MCV: 88.5 fL (ref 78.0–100.0)
PLATELETS: 133 10*3/uL — AB (ref 150–400)
RBC: 4.52 MIL/uL (ref 4.22–5.81)
RDW: 13.2 % (ref 11.5–15.5)
WBC: 5 10*3/uL (ref 4.0–10.5)

## 2017-12-18 LAB — BASIC METABOLIC PANEL
Anion gap: 9 (ref 5–15)
BUN: 19 mg/dL (ref 8–23)
CALCIUM: 8.6 mg/dL — AB (ref 8.9–10.3)
CO2: 29 mmol/L (ref 22–32)
CREATININE: 1.48 mg/dL — AB (ref 0.61–1.24)
Chloride: 100 mmol/L (ref 98–111)
GFR calc Af Amer: 56 mL/min — ABNORMAL LOW (ref >60)
GFR calc non Af Amer: 49 mL/min — ABNORMAL LOW (ref >60)
GLUCOSE: 99 mg/dL (ref 70–99)
POTASSIUM: 3.2 mmol/L — AB (ref 3.5–5.1)
SODIUM: 138 mmol/L (ref 135–145)

## 2017-12-18 LAB — EXPECTORATED SPUTUM ASSESSMENT W REFEX TO RESP CULTURE

## 2017-12-18 LAB — URINE CULTURE

## 2017-12-18 LAB — HEPARIN LEVEL (UNFRACTIONATED): HEPARIN UNFRACTIONATED: 0.99 [IU]/mL — AB (ref 0.30–0.70)

## 2017-12-18 LAB — HIV ANTIBODY (ROUTINE TESTING W REFLEX): HIV SCREEN 4TH GENERATION: NONREACTIVE

## 2017-12-18 LAB — EXPECTORATED SPUTUM ASSESSMENT W GRAM STAIN, RFLX TO RESP C

## 2017-12-18 LAB — STREP PNEUMONIAE URINARY ANTIGEN: Strep Pneumo Urinary Antigen: NEGATIVE

## 2017-12-18 MED ORDER — GUAIFENESIN ER 600 MG PO TB12
600.0000 mg | ORAL_TABLET | Freq: Two times a day (BID) | ORAL | Status: DC
  Administered 2017-12-18 – 2017-12-19 (×3): 600 mg via ORAL
  Filled 2017-12-18 (×3): qty 1

## 2017-12-18 MED ORDER — METHYLPREDNISOLONE SODIUM SUCC 125 MG IJ SOLR
40.0000 mg | Freq: Three times a day (TID) | INTRAMUSCULAR | Status: DC
  Administered 2017-12-18 – 2017-12-19 (×3): 40 mg via INTRAVENOUS
  Filled 2017-12-18 (×3): qty 2

## 2017-12-18 MED ORDER — RIVAROXABAN 15 MG PO TABS
15.0000 mg | ORAL_TABLET | Freq: Two times a day (BID) | ORAL | Status: DC
  Administered 2017-12-18 – 2017-12-19 (×3): 15 mg via ORAL
  Filled 2017-12-18 (×3): qty 1

## 2017-12-18 MED ORDER — ALBUTEROL SULFATE (2.5 MG/3ML) 0.083% IN NEBU: 2.5000 mg | INHALATION_SOLUTION | RESPIRATORY_TRACT | Status: DC | PRN

## 2017-12-18 MED ORDER — POTASSIUM CHLORIDE CRYS ER 20 MEQ PO TBCR
40.0000 meq | EXTENDED_RELEASE_TABLET | Freq: Once | ORAL | Status: AC
  Administered 2017-12-18: 40 meq via ORAL
  Filled 2017-12-18: qty 2

## 2017-12-18 MED ORDER — RIVAROXABAN 20 MG PO TABS: 20.0000 mg | ORAL_TABLET | Freq: Every day | ORAL | Status: DC

## 2017-12-18 MED ORDER — IPRATROPIUM-ALBUTEROL 0.5-2.5 (3) MG/3ML IN SOLN
3.0000 mL | Freq: Four times a day (QID) | RESPIRATORY_TRACT | Status: DC
  Administered 2017-12-18 – 2017-12-19 (×5): 3 mL via RESPIRATORY_TRACT
  Filled 2017-12-18 (×5): qty 3

## 2017-12-18 NOTE — Progress Notes (Signed)
ANTICOAGULATION CONSULT NOTE - Initial Consult  Pharmacy Consult for Xarelto Indication: DVT  Allergies  Allergen Reactions  . Buprenorphine Hcl Nausea And Vomiting  . Morphine And Related Nausea And Vomiting    Patient Measurements: Height: 5\' 8"  (172.7 cm) Weight: 224 lb (101.6 kg) IBW/kg (Calculated) : 68.4    Vital Signs: Temp: 98.3 F (36.8 C) (08/24 0300) Temp Source: Oral (08/24 0300) BP: 126/85 (08/24 0300) Pulse Rate: 57 (08/24 0300)  Labs: Recent Labs    12/17/17 1505 12/18/17 0402  HGB 13.0 12.6*  HCT 41.4 40.0  PLT 132* 133*  LABPROT 13.4  --   INR 1.03  --   HEPARINUNFRC  --  0.99*  CREATININE 1.56* 1.48*    Estimated Creatinine Clearance: 59 mL/min (A) (by C-G formula based on SCr of 1.48 mg/dL (H)).   Medical History: Past Medical History:  Diagnosis Date  . Anxiety   . Arthritis   . Chronic kidney disease    kidney stones  . Depression   . GERD (gastroesophageal reflux disease)   . Hypertension   . Neck pain   . 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 Right-sided low back pain with left-sided sciatica   . Seizures ([place])    no seizures in many years  . Shortness of breath dyspnea    with  exertion  . Sleep apnea    last sleep study more than 5 years  uses CPAP even when napping    Assessment:  Anticoag: Heparin>Xarelto for DVT. Hgb 12.6 stable. Plts 133 stable. Sr 1.48 (CrCl 55-60)  Goal of Therapy:  Therapeutic oral anticoagulation  Plan:  D/c IV heparin Xarelto 15mg  BID x 21d, then 20mg  daily.   Tilden Broz S. Alford [place], PharmD, BCPS Clinical Staff Pharmacist Pager [phone]  Eilene Ghazi Stillinger 12/18/2017,8:40 AM

## 2017-12-18 NOTE — Discharge Instructions (Addendum)

## 2017-12-18 NOTE — Progress Notes (Signed)
[facility] for heparin Indication: DVT  Allergies  Allergen Reactions  . Buprenorphine Hcl Nausea And Vomiting  . Morphine And Related Nausea And Vomiting    Patient Measurements: Height: 5\' 8"  (172.7 cm) Weight: 224 lb (101.6 kg) IBW/kg (Calculated) : 68.4 Heparin Dosing Weight: 90.8 Kg  Vital Signs: Temp: 98.3 F (36.8 C) (08/24 0300) Temp Source: Oral (08/24 0300) BP: 126/85 (08/24 0300) Pulse Rate: 57 (08/24 0300)  Labs: Recent Labs    12/17/17 1505 12/18/17 0402  HGB 13.0 12.6*  HCT 41.4 40.0  PLT 132* 133*  LABPROT 13.4  --   INR 1.03  --   HEPARINUNFRC  --  0.99*  CREATININE 1.56* 1.48*    Estimated Creatinine Clearance: 59 mL/min (A) (by C-G formula based on SCr of 1.48 mg/dL (H)).   Medical History: Past Medical History:  Diagnosis Date  . Anxiety   . Arthritis   . Chronic kidney disease    kidney stones  . Depression   . GERD (gastroesophageal reflux disease)   . Hypertension   . Neck pain   . 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 Right-sided low back pain with left-sided sciatica   . Seizures ([place])    no seizures in many years  . Shortness of breath dyspnea    with  exertion  . Sleep apnea    last sleep study more than 5 years  uses CPAP even when napping    Assessment: 68 yoM with lower extremity DVT. No anticoagulation reported PTA. Hgb 13.0, PLT 132.  Initial heparin level 0.99 units/ml  Heparin bolus was given ~4.5 hours ago.  Goal of Therapy:  Heparin level 0.3-0.7 units/ml Monitor platelets by anticoagulation protocol: Yes   Plan:  Decrease heparin infusion to 1400 units/hr Check anti-Xa level in 6 hours and daily while on heparin Continue to monitor H&H and platelets  Thanks for allowing pharmacy to be a part of this patient's care.  Excell Seltzer, PharmD Clinical Pharmacist
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
## 2017-12-19 DIAGNOSIS — J189 Pneumonia, unspecified organism: Secondary | ICD-10-CM | POA: Diagnosis not present

## 2017-12-19 DIAGNOSIS — I82411 Acute embolism and thrombosis of right femoral vein: Secondary | ICD-10-CM

## 2017-12-19 DIAGNOSIS — N179 Acute kidney failure, unspecified: Secondary | ICD-10-CM | POA: Diagnosis not present

## 2017-12-19 DIAGNOSIS — J181 Lobar pneumonia, unspecified organism: Secondary | ICD-10-CM | POA: Diagnosis not present

## 2017-12-19 LAB — BASIC METABOLIC PANEL
Anion gap: 11 (ref 5–15)
BUN: 15 mg/dL (ref 8–23)
CHLORIDE: 106 mmol/L (ref 98–111)
CO2: 22 mmol/L (ref 22–32)
Calcium: 8.8 mg/dL — ABNORMAL LOW (ref 8.9–10.3)
Creatinine, Ser: 1.32 mg/dL — ABNORMAL HIGH (ref 0.61–1.24)
GFR calc non Af Amer: 56 mL/min — ABNORMAL LOW (ref >60)
Glucose, Bld: 186 mg/dL — ABNORMAL HIGH (ref 70–99)
POTASSIUM: 3.8 mmol/L (ref 3.5–5.1)
SODIUM: 139 mmol/L (ref 135–145)

## 2017-12-19 MED ORDER — FLUTICASONE PROPIONATE 50 MCG/ACT NA SUSP: 1.0000 | Freq: Every day | NASAL | 0 refills | Status: DC

## 2017-12-19 MED ORDER — TIOTROPIUM BROMIDE MONOHYDRATE 18 MCG IN CAPS: 18.0000 ug | ORAL_CAPSULE | Freq: Every day | RESPIRATORY_TRACT | 0 refills | Status: DC

## 2017-12-19 MED ORDER — AMLODIPINE BESYLATE 5 MG PO TABS: 5.0000 mg | ORAL_TABLET | Freq: Every day | ORAL | 0 refills | Status: DC

## 2017-12-19 MED ORDER — PREDNISONE 10 MG PO TABS: ORAL_TABLET | ORAL | 0 refills | Status: DC

## 2017-12-19 MED ORDER — GUAIFENESIN ER 600 MG PO TB12: 600.0000 mg | ORAL_TABLET | Freq: Two times a day (BID) | ORAL | 0 refills | Status: DC

## 2017-12-19 MED ORDER — RIVAROXABAN (XARELTO) VTE STARTER PACK (15 & 20 MG): ORAL_TABLET | ORAL | 0 refills | Status: DC

## 2017-12-19 MED ORDER — ALBUTEROL SULFATE HFA 108 (90 BASE) MCG/ACT IN AERS: 2.0000 | INHALATION_SPRAY | RESPIRATORY_TRACT | 0 refills | Status: DC | PRN

## 2017-12-19 MED ORDER — AMOXICILLIN-POT CLAVULANATE 875-125 MG PO TABS: 1.0000 | ORAL_TABLET | Freq: Two times a day (BID) | ORAL | 0 refills | Status: DC

## 2017-12-19 NOTE — Plan of Care (Signed)
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
## 2017-12-19 NOTE — Progress Notes (Signed)
Written and verbal discharge instructions provided along with prescriptions.  The patient verbalizes understanding these instructions.  Patient is ambulating well without any respiratory distress.  The patient will be discharge via wheel chair when his wife arrives.

## 2017-12-21 ENCOUNTER — Telehealth: Payer: Self-pay | Admitting: Vascular Surgery

## 2017-12-21 NOTE — Telephone Encounter (Signed)
Scheduled appt for Dr.Cain on 01/28/18 for followup from ED visit per Dr.Cain's instructions. Patient's wife is aware of appt and I mailed NP pw as well.awt

## 2017-12-21 NOTE — Telephone Encounter (Signed)
-----   Message from Waynetta Sandy, MD sent at 12/20/2017  4:48 PM EDT ----- Regarding: RE: Appointment question He should wait at least 3-4 weeks and continue anticoagulation. Will not likely require any intervention.   Erlene Quan  ----- Message ----- From: Lujean Amel Sent: 12/20/2017   1:34 PM EDT To: Waynetta Sandy, MD Subject: Appointment question                           Hi Dr.Cain! This patient's wife called requesting a new patient appointment with you per the discharge instructions from [facility] ER yesterday. The patient had a positive DVT study on 12/17/17.  Do you want him to have any further testing here?  The first new patient appointment you have is 01/28/18. Is this appropriate for him to wait? Please advise. Thanks, Anne Ng

## 2017-12-22 LAB — CULTURE, BLOOD (ROUTINE X 2)
Culture: NO GROWTH
Culture: NO GROWTH
Special Requests: ADEQUATE
Special Requests: ADEQUATE

## 2018-01-02 DIAGNOSIS — G40909 Epilepsy, unspecified, not intractable, without status epilepticus: Secondary | ICD-10-CM | POA: Insufficient documentation

## 2018-01-03 ENCOUNTER — Telehealth: Payer: Self-pay | Admitting: Hematology

## 2018-01-03 ENCOUNTER — Encounter: Payer: Self-pay | Admitting: Internal Medicine

## 2018-01-03 ENCOUNTER — Encounter: Payer: Self-pay | Admitting: Hematology

## 2018-01-03 ENCOUNTER — Ambulatory Visit: Payer: Medicare HMO | Admitting: Internal Medicine

## 2018-01-03 VITALS — BP 118/80 | HR 75 | Ht 68.0 in | Wt 224.0 lb

## 2018-01-03 DIAGNOSIS — J189 Pneumonia, unspecified organism: Secondary | ICD-10-CM

## 2018-01-03 DIAGNOSIS — I82431 Acute embolism and thrombosis of right popliteal vein: Secondary | ICD-10-CM | POA: Diagnosis not present

## 2018-01-03 DIAGNOSIS — J449 Chronic obstructive pulmonary disease, unspecified: Secondary | ICD-10-CM

## 2018-01-03 DIAGNOSIS — J181 Lobar pneumonia, unspecified organism: Secondary | ICD-10-CM

## 2018-01-03 NOTE — Telephone Encounter (Signed)
Received a call from the pt's sgo to schedule a hematology appt. Pt was discharged from the hospital on 8/25 and needed a follow up for dvt. Pt has been scheduled to see Dr. Irene Limbo on 9/25 at 10am. Aware to arrive 30 minutes early. Letter mailed.

## 2018-01-03 NOTE — Patient Instructions (Addendum)
Plan A = Automatic = Incruse one pff daily until use up this inhaler then see if can leave off now that you've quit smoking   Plan B = Backup Only use your albuterol as a rescue medication to be used if you can't catch your breath by resting or doing a relaxed purse lip breathing pattern.  - The less you use it, the better it will work when you need it. - Ok to use the inhaler up to 2 puffs  every 4 hours if you must but call for appointment if use goes up over your usual need - Don't leave home without it !!  (think of it like the spare tire for your car)    Please schedule a follow up office visit in 6 weeks, call sooner if needed with all medications /inhalers/ solutions in hand so we can verify exactly what you are taking. This includes all medications from all doctors and over the [place] separate them into two bags:  the ones you take automatically, no matter what, vs the ones you take just when you feel you need them "BAG #2 is UP TO YOU"  - this will really help [place] help you take your medications more effectively.  - PFT's on return  - cxr on return
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
## 2018-01-04 ENCOUNTER — Encounter: Payer: Self-pay | Admitting: Internal Medicine

## 2018-01-04 NOTE — Assessment & Plan Note (Signed)
Clinically resolved, f/u cxr at next ov vs ct chest as not well viz on plain film at admit and really didn't have classic cap symptoms when admitted for cc leg pain/ dx  dvt with neg pe / advised    Total time devoted to counseling  > 50 % of initial 60 min office visit:  review case with pt/wife discussion of options/alternatives/ personally creating written customized instructions  in presence of pt  then going over those specific  Instructions directly with the pt including how to use all of the meds but in particular covering each new medication in detail and the difference between the maintenance= "automatic" meds and the prns using an action plan format for the latter (If this problem/symptom => do that organization reading Left to right).  Please see AVS from this visit for a full list of these instructions which I personally wrote for this pt and  are unique to this visit.

## 2018-01-04 NOTE — Assessment & Plan Note (Signed)
Quit smoking 11/2017  - Spirometry 01/03/2018  FEV1 2.7 (84%)  Ratio 82 after incruse and saba prior    Suprisingly little evidence of copd or even asthma now that quit smoking so ok  To try off incruse to see if more symptoms or tendency to AB or aecopd and if not just use saba prn     I reviewed the Fletcher curve with the patient that basically indicates  if you quit smoking when your best day FEV1 is still well preserved (as is clearly  the case here)  it is highly unlikely you will progress to severe disease and informed the patient there was  no medication on the market that has proven to alter the curve/ its downward trajectory  or the likelihood of progression of their disease(unlike other chronic medical conditions such as atheroclerosis where we do think we can change the natural hx with risk reducing meds)    Therefore stopping smoking and maintaining abstinence are  the most important aspects of his care, not choice of inhalers or for that matter, doctors.   Treatment other than smoking cessation  is entirely directed by severity of symptoms and focused also on reducing exacerbations, not attempting to change the natural history of the disease.

## 2018-01-04 NOTE — Assessment & Plan Note (Addendum)
See venous dopplers 12/17/17   Improving symptoms on xarelto >> F/u planned per hematology per pt - main risk factors are obesity/ sedentary due to back problems and L hemiparesis
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
## 2018-01-19 ENCOUNTER — Inpatient Hospital Stay: Payer: Medicare HMO | Attending: Hematology | Admitting: Hematology

## 2018-01-19 ENCOUNTER — Encounter: Payer: Self-pay | Admitting: Hematology

## 2018-01-19 VITALS — BP 148/80 | HR 66 | Temp 97.7°F | Resp 18 | Ht 68.0 in | Wt 233.5 lb

## 2018-01-19 DIAGNOSIS — I8290 Acute embolism and thrombosis of unspecified vein: Secondary | ICD-10-CM

## 2018-01-19 DIAGNOSIS — Z87891 Personal history of nicotine dependence: Secondary | ICD-10-CM | POA: Insufficient documentation

## 2018-01-19 DIAGNOSIS — I82441 Acute embolism and thrombosis of right tibial vein: Secondary | ICD-10-CM | POA: Diagnosis not present

## 2018-01-19 DIAGNOSIS — I82431 Acute embolism and thrombosis of right popliteal vein: Secondary | ICD-10-CM | POA: Diagnosis not present

## 2018-01-19 DIAGNOSIS — Z7901 Long term (current) use of anticoagulants: Secondary | ICD-10-CM | POA: Insufficient documentation

## 2018-01-19 DIAGNOSIS — I82411 Acute embolism and thrombosis of right femoral vein: Secondary | ICD-10-CM | POA: Diagnosis present

## 2018-01-19 MED ORDER — RIVAROXABAN 20 MG PO TABS: 20.0000 mg | ORAL_TABLET | Freq: Every day | ORAL | 0 refills | Status: DC

## 2018-01-20 ENCOUNTER — Telehealth: Payer: Self-pay

## 2018-01-20 NOTE — Telephone Encounter (Signed)
RTC with Dr Irene Limbo as needed

## 2018-01-24 ENCOUNTER — Telehealth: Payer: Self-pay | Admitting: Internal Medicine

## 2018-01-24 DIAGNOSIS — J449 Chronic obstructive pulmonary disease, unspecified: Secondary | ICD-10-CM

## 2018-01-24 MED ORDER — UMECLIDINIUM BROMIDE 62.5 MCG/INH IN AEPB: 1.0000 | INHALATION_SPRAY | Freq: Every day | RESPIRATORY_TRACT | 5 refills | Status: DC

## 2018-01-24 NOTE — Telephone Encounter (Signed)
Called and spoke to patient. Patient requesting incruse refill be sent to pharmacy. Script sent. Nothing further needed at this time.

## 2018-01-27 ENCOUNTER — Other Ambulatory Visit: Payer: Self-pay | Admitting: Family Medicine

## 2018-01-28 ENCOUNTER — Encounter: Payer: Self-pay | Admitting: Vascular Surgery

## 2018-01-28 ENCOUNTER — Other Ambulatory Visit: Payer: Self-pay

## 2018-01-28 ENCOUNTER — Ambulatory Visit: Payer: Medicare HMO | Admitting: Vascular Surgery

## 2018-01-28 VITALS — BP 145/91 | HR 62 | Resp 20 | Ht 68.0 in | Wt 235.0 lb

## 2018-01-28 DIAGNOSIS — I82411 Acute embolism and thrombosis of right femoral vein: Secondary | ICD-10-CM
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
## 2018-02-01 ENCOUNTER — Other Ambulatory Visit (HOSPITAL_COMMUNITY): Payer: Self-pay | Admitting: Family Medicine

## 2018-02-01 DIAGNOSIS — R609 Edema, unspecified: Secondary | ICD-10-CM

## 2018-02-03 ENCOUNTER — Other Ambulatory Visit: Payer: Self-pay | Admitting: Family Medicine

## 2018-02-03 DIAGNOSIS — R911 Solitary pulmonary nodule: Secondary | ICD-10-CM

## 2018-02-09 ENCOUNTER — Inpatient Hospital Stay
Admission: RE | Admit: 2018-02-09 | Discharge: 2018-02-09 | Disposition: A | Discharge status: Home / Self Care | Payer: Medicare HMO | Source: Ambulatory Visit | Attending: Family Medicine | Admitting: Family Medicine

## 2018-02-10 ENCOUNTER — Ambulatory Visit (HOSPITAL_COMMUNITY)
Admission: RE | Admit: 2018-02-10 | Discharge: 2018-02-10 | Disposition: A | Discharge status: Home / Self Care | Payer: Medicare HMO | Source: Ambulatory Visit | Attending: Family Medicine | Admitting: Family Medicine

## 2018-02-10 DIAGNOSIS — Z86718 Personal history of other venous thrombosis and embolism: Secondary | ICD-10-CM | POA: Diagnosis not present

## 2018-02-10 DIAGNOSIS — Z136 Encounter for screening for cardiovascular disorders: Secondary | ICD-10-CM | POA: Insufficient documentation

## 2018-02-10 DIAGNOSIS — R0602 Shortness of breath: Secondary | ICD-10-CM | POA: Insufficient documentation

## 2018-02-10 DIAGNOSIS — R609 Edema, unspecified: Secondary | ICD-10-CM | POA: Diagnosis present

## 2018-02-10 NOTE — Progress Notes (Signed)
  Echocardiogram 2D Echocardiogram has been performed.  Evan [name] F 02/10/2018, 3:18 PM

## 2018-02-11 ENCOUNTER — Telehealth: Payer: Self-pay | Admitting: Internal Medicine

## 2018-02-11 MED ORDER — ALBUTEROL SULFATE HFA 108 (90 BASE) MCG/ACT IN AERS: 2.0000 | INHALATION_SPRAY | RESPIRATORY_TRACT | 5 refills | Status: DC | PRN

## 2018-02-11 NOTE — Telephone Encounter (Signed)
Pt aware that Rx has been sent to [facility] as requested. Nothing more needed at this time.

## 2018-02-15 ENCOUNTER — Other Ambulatory Visit: Payer: Self-pay | Admitting: Hematology

## 2018-02-18 DIAGNOSIS — I82409 Acute embolism and thrombosis of unspecified deep veins of unspecified lower extremity: Secondary | ICD-10-CM | POA: Insufficient documentation

## 2018-02-22 ENCOUNTER — Ambulatory Visit (INDEPENDENT_AMBULATORY_CARE_PROVIDER_SITE_OTHER): Payer: Medicare HMO | Admitting: Internal Medicine

## 2018-02-22 ENCOUNTER — Encounter: Payer: Self-pay | Admitting: Internal Medicine

## 2018-02-22 VITALS — BP 150/80 | HR 72 | Ht 66.54 in | Wt 237.0 lb

## 2018-02-22 DIAGNOSIS — R911 Solitary pulmonary nodule: Secondary | ICD-10-CM

## 2018-02-22 DIAGNOSIS — J449 Chronic obstructive pulmonary disease, unspecified: Secondary | ICD-10-CM | POA: Diagnosis not present

## 2018-02-22 LAB — PULMONARY FUNCTION TEST
DL/VA % pred: 107 %
DL/VA: 4.72 ml/min/mmHg/L
DLCO UNC % PRED: 96 %
DLCO UNC: 26.54 ml/min/mmHg
FEF 25-75 POST: 3.16 L/s
FEF 25-75 PRE: 2.51 L/s
FEF2575-%CHANGE-POST: 26 %
FEF2575-%PRED-POST: 126 %
FEF2575-%PRED-PRE: 100 %
FEV1-%Change-Post: 4 %
FEV1-%Pred-Post: 93 %
FEV1-%Pred-Pre: 89 %
FEV1-POST: 2.88 L
FEV1-Pre: 2.76 L
FEV1FVC-%CHANGE-POST: 1 %
FEV1FVC-%PRED-PRE: 106 %
FEV6-%CHANGE-POST: 2 %
FEV6-%PRED-POST: 90 %
FEV6-%Pred-Pre: 88 %
FEV6-PRE: 3.45 L
FEV6-Post: 3.53 L
FEV6FVC-%CHANGE-POST: 0 %
FEV6FVC-%PRED-PRE: 105 %
FEV6FVC-%Pred-Post: 104 %
FVC-%CHANGE-POST: 3 %
FVC-%PRED-POST: 86 %
FVC-%Pred-Pre: 84 %
FVC-Post: 3.55 L
FVC-Pre: 3.45 L
POST FEV1/FVC RATIO: 81 %
PRE FEV1/FVC RATIO: 80 %
Post FEV6/FVC ratio: 99 %
Pre FEV6/FVC Ratio: 100 %
RV % PRED: 102 %
RV: 2.17 L
TLC % pred: 93 %
TLC: 5.87 L
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
## 2018-02-22 NOTE — Patient Instructions (Addendum)
Try off incruse to see what difference if any it makes (like high octane fuel)   Only use your albuterol as a rescue medication to be used if you can't catch your breath by resting or doing a relaxed purse lip breathing pattern.  - The less you use it, the better it will work when you need it. - Ok to use up to 2 puffs  every 4 hours if you must but call for immediate appointment if use goes up over your usual need - Don't leave home without it !!  (think of it like the spare tire for your car)     Schedule a visit with Evan [name] in 11/2018 for a shared decision making before CT

## 2018-02-22 NOTE — Progress Notes (Signed)
PFT completed today. 02/22/18

## 2018-02-23 ENCOUNTER — Encounter: Payer: Self-pay | Admitting: Internal Medicine

## 2018-02-23 DIAGNOSIS — R911 Solitary pulmonary nodule: Secondary | ICD-10-CM | POA: Insufficient documentation

## 2018-02-23 NOTE — Assessment & Plan Note (Signed)
Quit smoking 11/2017  - Spirometry 01/03/2018  FEV1 2.7 (84%)  Ratio 82 after incruse and saba prior   - PFT's  02/22/2018  FEV1 2.88 (93 % ) ratio 81  p 4 % improvement from saba p incruse prior to study with DLCO  96 % corrects to 107 % for alv volume > rec try off incruse  No indication for incruse so ok to try off and restart if he notes a worsening of doe much more likely obesity/ conditioning related than airflow limited.

## 2018-02-23 NOTE — Assessment & Plan Note (Addendum)
CT 12/17/17 Stable 4 mm nodular density in the left upper lobe unchanged.  ? Willing to do LDSCT Low-dose CT lung cancer screening is recommended for patients who are 57-63 years of age with a 30+ pack-year history of smoking, and who are currently smoking or quit <=15 years ago.   Discussed in detail all the  indications, usual  risks and alternatives  relative to the benefits with patient who agrees to proceed with conservative f/u as outlined  With f/u Eric Form in 11/2018 for a shared decision making before CT      I had an extended summary final discussion with the patient and wife  reviewing all relevant studies completed to date and  lasting 15 to 20 minutes of a 25 minute visit    Each maintenance medication was reviewed in detail including most importantly the difference between maintenance and prns and under what circumstances the prns are to be triggered using an action plan format that is not reflected in the computer generated alphabetically organized AVS.     Please see AVS for specific instructions unique to this visit that I personally wrote and verbalized to the the pt in detail and then reviewed with pt  by my nurse highlighting any  changes in therapy recommended at today's visit to their plan of care.

## 2018-02-23 NOTE — Assessment & Plan Note (Addendum)
ERV only 4% by pfts 75/42/3702 Complicated by hbp/ osa    Body mass index is 37.64 kg/m.  -  trending up still  Lab Results  Component Value Date   TSH 1.151 12/17/2017     Contributing to gerd risk/ doe/reviewed the need and the process to achieve and maintain neg calorie balance > defer f/u primary care including intermittently monitoring thyroid status.

## 2018-02-24 ENCOUNTER — Other Ambulatory Visit: Payer: Self-pay | Admitting: Physician Assistant

## 2018-02-24 ENCOUNTER — Ambulatory Visit
Admission: RE | Admit: 2018-02-24 | Discharge: 2018-02-24 | Disposition: A | Discharge status: Home / Self Care | Payer: Medicare HMO | Source: Ambulatory Visit | Attending: Physician Assistant | Admitting: Physician Assistant

## 2018-02-24 DIAGNOSIS — M47816 Spondylosis without myelopathy or radiculopathy, lumbar region: Secondary | ICD-10-CM

## 2018-03-21 DIAGNOSIS — K219 Gastro-esophageal reflux disease without esophagitis: Secondary | ICD-10-CM | POA: Insufficient documentation

## 2018-04-19 DIAGNOSIS — G4730 Sleep apnea, unspecified: Secondary | ICD-10-CM | POA: Insufficient documentation

## 2018-04-25 ENCOUNTER — Encounter: Payer: Self-pay | Admitting: Vascular Surgery

## 2018-04-25 ENCOUNTER — Other Ambulatory Visit: Payer: Self-pay

## 2018-04-25 ENCOUNTER — Ambulatory Visit (INDEPENDENT_AMBULATORY_CARE_PROVIDER_SITE_OTHER): Payer: Medicare HMO | Admitting: Vascular Surgery

## 2018-04-25 VITALS — BP 146/96 | HR 70 | Temp 97.9°F | Resp 18 | Ht 66.54 in | Wt 237.0 lb

## 2018-04-25 DIAGNOSIS — I87001 Postthrombotic syndrome without complications of right lower extremity: Secondary | ICD-10-CM

## 2018-04-25 NOTE — Progress Notes (Signed)
Patient ID: Evan [name], male   DOB: 04-Nov-1955, 62 y.o.   MRN: [id number]  Reason for Consult: Follow-up (eval swelling and pain R leg poss DVT )   Referred by Levin Bacon, NP  Subjective:     HPI:  Evan [name] is a 62 y.o. male with a history of a right lower extremity DVT that was in his right femoral vein.  I then saw him for persistent swelling recommended compression stockings which he has been attempting to wear although he does have significant disability of his hands.  He is still taking his Xarelto at this time.  He is walking with the help of a cane.  He states that he does have pain heaviness, itching.  He does not have any significant skin changes to his leg.  Swelling is worse throughout the day.  Does improve with elevation and at nighttime.  Does not have any tissue loss.  Past Medical History:  Diagnosis Date  . Anxiety   . Arthritis   . Chronic kidney disease    kidney stones  . COPD (chronic obstructive pulmonary disease) ([place])   . Depression   . DVT (deep venous thrombosis) ([place])   . GERD (gastroesophageal reflux disease)   . Hypertension   . Neck pain   . Neuromuscular disorder ([place])    Chronic left sided weakness due to accident many years ago  . Right-sided low back pain with left-sided sciatica   . Seizures ([place])    no seizures in many years  . Shortness of breath dyspnea    with  exertion  . Sleep apnea    last sleep study more than 5 years  uses CPAP even when napping   Family History  Problem Relation Age of Onset  . Hepatitis Mother   . Alzheimer's disease Father    Past Surgical History:  Procedure Laterality Date  . kidney stones    . Left leg    . NECK SURGERY     4-5--6 and 7  . TOTAL HIP ARTHROPLASTY Right 07/09/2015   Procedure: RIGHT TOTAL HIP ARTHROPLASTY ANTERIOR APPROACH;  Surgeon: Mcarthur Rossetti, MD;  Location: [facility];  Service: Orthopedics;  Laterality: Right;    Short Social History:  Social History    Tobacco Use  . Smoking status: Former Smoker    Packs/day: 2.00    Years: 45.00    Pack years: 90.00    Types: Cigarettes    Last attempt to quit: 12/15/2017    Years since quitting: 0.3  . Smokeless tobacco: Never Used  Substance Use Topics  . Alcohol use: Yes    Alcohol/week: 1.0 - 2.0 standard drinks    Types: 1 - 2 Shots of liquor per week    Comment: daily     Allergies  Allergen Reactions  . Buprenorphine Hcl Nausea And Vomiting  . Morphine And Related Nausea And Vomiting    Current Outpatient Medications  Medication Sig Dispense Refill  . albuterol (PROVENTIL HFA;VENTOLIN HFA) 108 (90 Base) MCG/ACT inhaler Inhale 2 puffs into the lungs every 4 (four) hours as needed for wheezing or shortness of breath. 1 Inhaler 5  . ALPRAZolam (XANAX) 0.5 MG tablet Take 0.5 mg by mouth at bedtime.     Marland Kitchen amLODipine (NORVASC) 5 MG tablet Take 1 tablet (5 mg total) by mouth daily. 30 tablet 0  . fexofenadine (ALLEGRA) 180 MG tablet Take 180 mg by mouth at bedtime.    Marland Kitchen FLUoxetine (PROZAC) 10  MG capsule Take 20 mg by mouth at bedtime.     . fluticasone (FLONASE) 50 MCG/ACT nasal spray Place 1 spray into both nostrils daily. 16 g 0  . gabapentin (NEURONTIN) 300 MG capsule Take 2 capsules (600 mg total) by mouth 2 (two) times daily. 360 capsule 4  . guaiFENesin (MUCINEX) 600 MG 12 hr tablet Take 1 tablet (600 mg total) by mouth 2 (two) times daily. 14 tablet 0  . HYDROcodone-acetaminophen (NORCO/VICODIN) 5-325 MG tablet Take 1 tablet by mouth every 6 (six) hours as needed for moderate pain or severe pain.    Marland Kitchen omeprazole (PRILOSEC) 40 MG capsule     . rivaroxaban (XARELTO) 20 MG TABS tablet Take 1 tablet (20 mg total) by mouth daily with supper. Further refills with Primary care physician 30 tablet 0   No current facility-administered medications for this visit.     Review of Systems  HENT: HENT negative.  Eyes: Eyes negative.  Respiratory: Respiratory negative.  Cardiovascular:  Positive for leg swelling.  GI: Gastrointestinal negative.  Musculoskeletal: Musculoskeletal negative.  Skin: Skin negative.  Hematologic: Hematologic/lymphatic negative.        Objective:  Objective   Vitals:   04/25/18 1020  BP: (!) 146/96  Pulse: 70  Resp: 18  Temp: 97.9 F (36.6 C)  TempSrc: Oral  SpO2: 95%  Weight: 237 lb (107.5 kg)  Height: 5' 6.54" (1.69 m)   Body mass index is 37.63 kg/m.  Physical Exam Eyes:     Pupils: Pupils are equal, round, and reactive to light.  Cardiovascular:     Rate and Rhythm: Regular rhythm.     Pulses:          Radial pulses are 2+ on the right side and 2+ on the left side.  Pulmonary:     Effort: Pulmonary effort is normal.  Abdominal:     General: Abdomen is flat.     Palpations: Abdomen is soft.  Musculoskeletal:        General: Swelling present.  Skin:    Capillary Refill: Capillary refill takes less than 2 seconds.  Neurological:     Mental Status: He is alert.  Psychiatric:        Mood and Affect: Mood normal.        Behavior: Behavior normal.        Thought Content: Thought content normal.        Judgment: Judgment normal.     Data: No studies today and I have reviewed his previous duplex of his right lower extremity which demonstrated acute thrombus in his right femoral vein and his common femoral vein was patent  Villalta score: 7     Assessment/Plan:     62 year old male with history of extensive right lower extremity DVT.  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  He does have significant swelling which is life limiting for him at this time.  There would be 2 options to check for central obstruction which will be difficult with IVC iliac duplex given his size or to evaluate for reflux and I think that would be the easier test given that obstructive evaluation would require venography.  He is wearing his compression stockings at this time.  We will get him  set up for reflux testing.  If this is negative we can consider central venography.  He will continue Xarelto out to February which will be a total of 6 months.  Ranie Chinchilla Christopher Uliana Brinker MD [facility]  

## 2018-05-06 ENCOUNTER — Other Ambulatory Visit: Payer: Self-pay | Admitting: Vascular Surgery

## 2018-05-06 DIAGNOSIS — I87001 Postthrombotic syndrome without complications of right lower extremity: Secondary | ICD-10-CM

## 2018-05-06 DIAGNOSIS — I82411 Acute embolism and thrombosis of right femoral vein: Secondary | ICD-10-CM

## 2018-06-10 ENCOUNTER — Ambulatory Visit (HOSPITAL_COMMUNITY)
Admission: RE | Admit: 2018-06-10 | Discharge: 2018-06-10 | Disposition: A | Discharge status: Home / Self Care | Payer: Medicare HMO | Source: Ambulatory Visit | Attending: Family | Admitting: Family

## 2018-06-10 ENCOUNTER — Other Ambulatory Visit: Payer: Self-pay

## 2018-06-10 ENCOUNTER — Encounter: Payer: Self-pay | Admitting: Vascular Surgery

## 2018-06-10 ENCOUNTER — Ambulatory Visit: Payer: Medicare HMO | Admitting: Vascular Surgery

## 2018-06-10 VITALS — BP 118/77 | HR 75 | Temp 97.4°F | Resp 20 | Ht 66.0 in | Wt 244.2 lb

## 2018-06-10 DIAGNOSIS — I87001 Postthrombotic syndrome without complications of right lower extremity: Secondary | ICD-10-CM | POA: Insufficient documentation

## 2018-06-10 DIAGNOSIS — I82411 Acute embolism and thrombosis of right femoral vein: Secondary | ICD-10-CM | POA: Diagnosis present

## 2018-06-10 NOTE — Progress Notes (Signed)
Patient ID: Odessa Fleming, male   DOB: 22-Nov-1955, 63 y.o.   MRN: [id number]  Reason for Consult: Follow-up   Referred by Levin Bacon, NP  Subjective:     HPI:  VALERIE FREDIN is a 62 y.o. male whom I last saw in October of last year for extensive right lower extremity DVT.  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  The time I saw him he was unable to wear compression stockings but since that time he has been wearing them quite religiously.  That episode of DVT was his first.  He is quit smoking and did gain over 40 pounds since that.  He is taking Xarelto daily states that he has approximately 1 more month of therapy for that.  He does have swelling in his right lower extremity that has become quite bothersome given that his right leg is strong.  He does not have ulceration or tissue loss.  He presents today with lower extremity venous reflux study.  Past Medical History:  Diagnosis Date  . Anxiety   . Arthritis   . Chronic kidney disease    kidney stones  . COPD (chronic obstructive pulmonary disease) ([place])   . Depression   . DVT (deep venous thrombosis) ([place])   . GERD (gastroesophageal reflux disease)   . Hypertension   . Neck pain   . Neuromuscular disorder ([place])    Chronic left sided weakness due to accident many years ago  . Right-sided low back pain with left-sided sciatica   . Seizures ([place])    no seizures in many years  . Shortness of breath dyspnea    with  exertion  . Sleep apnea    last sleep study more than 5 years  uses CPAP even when napping   Family History  Problem Relation Age of Onset  . Hepatitis Mother   . Alzheimer's disease Father    Past Surgical History:  Procedure Laterality Date  . kidney stones    . Left leg    . NECK SURGERY     4-5--6 and 7  . TOTAL HIP ARTHROPLASTY Right 07/09/2015   Procedure: RIGHT TOTAL HIP ARTHROPLASTY ANTERIOR  APPROACH;  Surgeon: Mcarthur Rossetti, MD;  Location: [facility];  Service: Orthopedics;  Laterality: Right;    Short Social History:  Social History   Tobacco Use  . Smoking status: Former Smoker    Packs/day: 2.00    Years: 45.00    Pack years: 90.00    Types: Cigarettes    Last attempt to quit: 12/15/2017    Years since quitting: 0.4  . Smokeless tobacco: Never Used  Substance Use Topics  . Alcohol use: Yes    Alcohol/week: 1.0 - 2.0 standard drinks    Types: 1 - 2 Shots of liquor per week    Comment: daily     Allergies  Allergen Reactions  . Buprenorphine Hcl Nausea And Vomiting  . Morphine And Related Nausea And Vomiting    Current Outpatient Medications  Medication Sig Dispense Refill  . albuterol (PROVENTIL HFA;VENTOLIN HFA) 108 (90 Base) MCG/ACT inhaler Inhale 2 puffs into the lungs every 4 (four) hours as needed for wheezing or shortness of breath. 1 Inhaler 5  . ALPRAZolam (XANAX) 0.5 MG tablet Take 0.5 mg by mouth at bedtime as needed for anxiety.     Marland Kitchen amLODipine (NORVASC) 5 MG  tablet Take 1 tablet (5 mg total) by mouth daily. 30 tablet 0  . fexofenadine (ALLEGRA) 180 MG tablet Take 180 mg by mouth at bedtime.    Marland Kitchen FLUoxetine (PROZAC) 10 MG capsule Take 20 mg by mouth at bedtime.     . gabapentin (NEURONTIN) 300 MG capsule Take 2 capsules (600 mg total) by mouth 2 (two) times daily. 360 capsule 4  . HYDROcodone-acetaminophen (NORCO/VICODIN) 5-325 MG tablet Take 1 tablet by mouth every 6 (six) hours as needed for moderate pain or severe pain.    Marland Kitchen omeprazole (PRILOSEC) 40 MG capsule     . rivaroxaban (XARELTO) 20 MG TABS tablet Take 1 tablet (20 mg total) by mouth daily with supper. Further refills with Primary care physician 30 tablet 0  . fluticasone (FLONASE) 50 MCG/ACT nasal spray Place 1 spray into both nostrils daily. (Patient not taking: Reported on 06/10/2018) 16 g 0   No current facility-administered medications for this visit.     Review of Systems    Constitutional:  Constitutional negative. Eyes: Eyes negative.  Respiratory: Positive for shortness of breath and wheezing.  Cardiovascular: Positive for leg swelling.  GI: Gastrointestinal negative.  Musculoskeletal: Positive for back pain, gait problem and leg pain.  Skin: Skin negative.  Hematologic: Hematologic/lymphatic negative.  Psychiatric: Psychiatric negative.        Objective:  Objective   Vitals:   06/10/18 1056  BP: 118/77  Pulse: 75  Resp: 20  Temp: (!) 97.4 F (36.3 C)  SpO2: 96%  Weight: 244 lb 2.6 oz (110.7 kg)  Height: 5\' 6"  (1.676 m)   Body mass index is 39.41 kg/m.  Physical Exam HENT:     Head: Normocephalic.     Mouth/Throat:     Mouth: Mucous membranes are moist.  Eyes:     Pupils: Pupils are equal, round, and reactive to light.  Neck:     Musculoskeletal: Normal range of motion and neck supple.  Cardiovascular:     Rate and Rhythm: Normal rate.  Pulmonary:     Effort: Pulmonary effort is normal.  Abdominal:     General: Abdomen is flat.  Musculoskeletal:     Comments: Nonpitting edema right lower extremity  Skin:    General: Skin is warm and dry.     Capillary Refill: Capillary refill takes less than 2 seconds.  Neurological:     General: No focal deficit present.     Mental Status: He is alert.  Psychiatric:        Mood and Affect: Mood normal.        Behavior: Behavior normal.     Data: I have independently interpreted his lower extremity reflux 30 which demonstrates reflux in the right common femoral vein as well as left common femoral vein and the right greater saphenous vein throughout up to 1240 ms and minimally in the left greater saphenous vein at 645 ms.  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  He is  wearing compression stockings religiously.  He is on Xarelto and will transition to baby aspirin after this.  He is unsure that he wants any more surgery or any procedure given that he has had multiple in the past.  I discussed with him the need  to continue his compression stockings and elevating his leg when he is recumbent.  He will follow-up in 3 months to discuss possible greater saphenous vein ablation given that he does have deep venous reflux as well this will not treat the entirety of the problem and he demonstrates good understanding of this.     Waynetta Sandy MD [facility]

## 2018-09-08 ENCOUNTER — Ambulatory Visit: Payer: Medicare HMO | Admitting: Vascular Surgery

## 2018-09-29 ENCOUNTER — Other Ambulatory Visit: Payer: Self-pay | Admitting: Pain Medicine

## 2018-09-29 DIAGNOSIS — M545 Low back pain, unspecified: Secondary | ICD-10-CM

## 2018-10-03 ENCOUNTER — Other Ambulatory Visit: Payer: Self-pay

## 2018-10-03 ENCOUNTER — Emergency Department (HOSPITAL_COMMUNITY): Payer: Medicare HMO

## 2018-10-03 ENCOUNTER — Encounter (HOSPITAL_COMMUNITY): Payer: Self-pay | Admitting: Emergency Medicine

## 2018-10-03 ENCOUNTER — Emergency Department (HOSPITAL_COMMUNITY)
Admission: EM | Admit: 2018-10-03 | Discharge: 2018-10-03 | Disposition: A | Discharge status: Home / Self Care | Payer: Medicare HMO | Attending: Emergency Medicine | Admitting: Emergency Medicine

## 2018-10-03 DIAGNOSIS — Z7901 Long term (current) use of anticoagulants: Secondary | ICD-10-CM | POA: Diagnosis not present

## 2018-10-03 DIAGNOSIS — R0600 Dyspnea, unspecified: Secondary | ICD-10-CM | POA: Diagnosis not present

## 2018-10-03 DIAGNOSIS — Z87891 Personal history of nicotine dependence: Secondary | ICD-10-CM | POA: Diagnosis not present

## 2018-10-03 DIAGNOSIS — I10 Essential (primary) hypertension: Secondary | ICD-10-CM | POA: Diagnosis not present

## 2018-10-03 DIAGNOSIS — Z79899 Other long term (current) drug therapy: Secondary | ICD-10-CM | POA: Insufficient documentation

## 2018-10-03 DIAGNOSIS — J449 Chronic obstructive pulmonary disease, unspecified: Secondary | ICD-10-CM | POA: Diagnosis not present

## 2018-10-03 DIAGNOSIS — R0602 Shortness of breath: Secondary | ICD-10-CM | POA: Diagnosis present

## 2018-10-03 LAB — CBC
HCT: 44.1 % (ref 39.0–52.0)
Hemoglobin: 13.5 g/dL (ref 13.0–17.0)
MCH: 26.4 pg (ref 26.0–34.0)
MCHC: 30.6 g/dL (ref 30.0–36.0)
MCV: 86.1 fL (ref 80.0–100.0)
Platelets: 214 10*3/uL (ref 150–400)
RBC: 5.12 MIL/uL (ref 4.22–5.81)
RDW: 13.7 % (ref 11.5–15.5)
WBC: 7.4 10*3/uL (ref 4.0–10.5)
nRBC: 0 % (ref 0.0–0.2)

## 2018-10-03 LAB — BASIC METABOLIC PANEL
Anion gap: 9 (ref 5–15)
BUN: 19 mg/dL (ref 8–23)
CO2: 23 mmol/L (ref 22–32)
Calcium: 9.4 mg/dL (ref 8.9–10.3)
Chloride: 107 mmol/L (ref 98–111)
Creatinine, Ser: 1.3 mg/dL — ABNORMAL HIGH (ref 0.61–1.24)
GFR calc Af Amer: >60 mL/min (ref >60)
GFR calc non Af Amer: 58 mL/min — ABNORMAL LOW (ref >60)
Glucose, Bld: 160 mg/dL — ABNORMAL HIGH (ref 70–99)
Potassium: 4.3 mmol/L (ref 3.5–5.1)
Sodium: 139 mmol/L (ref 135–145)

## 2018-10-03 LAB — PROTIME-INR
INR: 1 (ref 0.8–1.2)
Prothrombin Time: 13.4 seconds (ref 11.4–15.2)

## 2018-10-03 MED ORDER — ALBUTEROL SULFATE HFA 108 (90 BASE) MCG/ACT IN AERS: 1.0000 | INHALATION_SPRAY | Freq: Four times a day (QID) | RESPIRATORY_TRACT | 0 refills | Status: AC | PRN

## 2018-10-03 MED ORDER — PREDNISONE 10 MG PO TABS: 20.0000 mg | ORAL_TABLET | Freq: Every day | ORAL | 0 refills | Status: DC

## 2018-10-03 MED ORDER — IOHEXOL 350 MG/ML SOLN
100.0000 mL | Freq: Once | INTRAVENOUS | Status: AC | PRN
  Administered 2018-10-03: 15:00:00 100 mL via INTRAVENOUS

## 2018-10-03 MED ORDER — SODIUM CHLORIDE 0.9% FLUSH: 3.0000 mL | Freq: Once | INTRAVENOUS | Status: DC

## 2018-10-03 NOTE — ED Triage Notes (Signed)
Pt reports several months, 4-5, of SOB and fatigue. Pt reports going to his PCP for same but nothing is currently working.

## 2018-10-03 NOTE — ED Provider Notes (Signed)
[facility] EMERGENCY DEPARTMENT Provider Note   CSN: [id number] Arrival date & time: 10/03/18  1058    History   Chief Complaint Chief Complaint  Patient presents with   Shortness of Breath   Fatigue    HPI Evan [name] is a 63 y.o. male.     HPI  63 year old male presents today complaining of increasing dyspnea over the past 10 months.  He states last August for pneumonia and quit smoking at that time.  Review of records reveal that he had a right lower extremity DVT with a suspected pneumonia in the setting of probable undiagnosed COPD at that time.  Ports that since that time he has had ongoing worsening dyspnea.  Review of pulmonary records revealed that he had a 4 mm left upper lung nodule noted is unchanged from CT of 2018.  Patient followed by Dr. Shyrl Numbers of pulmonary.  Last seen in October 2019.  He was advised to use albuterol as needed.  He states he uses it only when needed which is not very frequently.  Patient denies any new fever, chills, productive cough, COVID-19 exposure, or travel. Patient thinks that he has seen a cardiologist back and find no record of cardiology consult.  He did have an echo ordered and performed that showed moderate concentric hypertrophy with EF 55 to 60% out any regional wall motion abnormalities.  Patient has history of DVT and post thrombotic syndrome.  He does not currently have any lateralized swelling. Past Medical History:  Diagnosis Date   Anxiety    Arthritis    Chronic kidney disease    kidney stones   COPD (chronic obstructive pulmonary disease) ([facility])    Depression    DVT (deep venous thrombosis) ([facility])    GERD (gastroesophageal reflux disease)    Hypertension    Neck pain    Neuromuscular disorder ([facility])    Chronic left sided weakness due to accident many years ago   Right-sided low back pain with left-sided sciatica    Seizures ([place])    no seizures in many years   Shortness of breath dyspnea      with  exertion   Sleep apnea    last sleep study more than 5 years  uses CPAP even when napping    Patient Active Problem List   Diagnosis Date Noted   Morbid obesity due to excess calories ([place]) 02/23/2018   Pulmonary nodule 02/23/2018   COPD GOLD 0  01/03/2018   Acute deep vein thrombosis (DVT) of right popliteal vein ([place]) 12/17/2017   AKI (acute kidney injury) ([place]) 12/17/2017   Community acquired pneumonia of right upper lobe of lung ([place]) 12/17/2017   HTN (hypertension) 12/17/2017   Osteoarthritis of right hip 07/09/2015   Status post total replacement of right hip 07/09/2015   Right-sided low back pain with left-sided sciatica    Low back pain 09/20/2013   Neck pain 09/20/2013   Alteration consciousness 09/20/2013    Past Surgical History:  Procedure Laterality Date   kidney stones     Left leg     NECK SURGERY     4-5--6 and 7   TOTAL HIP ARTHROPLASTY Right 07/09/2015   Procedure: RIGHT TOTAL HIP ARTHROPLASTY ANTERIOR APPROACH;  Surgeon: Mcarthur Rossetti, MD;  Location: [facility];  Service: Orthopedics;  Laterality: Right;        Home Medications    Prior to Admission medications   Medication Sig Start Date End Date Taking? Authorizing Provider  albuterol (PROVENTIL HFA;VENTOLIN HFA) 108 (90 Base) MCG/ACT inhaler Inhale 2 puffs into the lungs every 4 (four) hours as needed for wheezing or shortness of breath. 02/11/18   Tanda Rockers, MD  ALPRAZolam Duanne Moron) 0.5 MG tablet Take 0.5 mg by mouth at bedtime as needed for anxiety.     [provider]  amLODipine (NORVASC) 5 MG tablet Take 1 tablet (5 mg total) by mouth daily. 12/19/17 12/19/18  Ghimire, Henreitta Leber, MD  fexofenadine (ALLEGRA) 180 MG tablet Take 180 mg by mouth at bedtime.    [provider]  FLUoxetine (PROZAC) 10 MG capsule Take 20 mg by mouth at bedtime.     [provider]  fluticasone (FLONASE) 50 MCG/ACT nasal spray Place 1 spray into both nostrils  daily. Patient not taking: Reported on 06/10/2018 12/19/17 12/19/18  Jonetta Osgood, MD  gabapentin (NEURONTIN) 300 MG capsule Take 2 capsules (600 mg total) by mouth 2 (two) times daily. 10/23/15   Marcial Pacas, MD  HYDROcodone-acetaminophen (NORCO/VICODIN) 5-325 MG tablet Take 1 tablet by mouth every 6 (six) hours as needed for moderate pain or severe pain.    [provider]  omeprazole (PRILOSEC) 40 MG capsule  11/15/17   [provider]  rivaroxaban (XARELTO) 20 MG TABS tablet Take 1 tablet (20 mg total) by mouth daily with supper. Further refills with Primary care physician 01/19/18   Brunetta Genera, MD    Family History Family History  Problem Relation Age of Onset   Hepatitis Mother    Alzheimer's disease Father     Social History Social History   Tobacco Use   Smoking status: Former Smoker    Packs/day: 2.00    Years: 45.00    Pack years: 90.00    Types: Cigarettes    Last attempt to quit: 12/15/2017    Years since quitting: 0.8   Smokeless tobacco: Never Used  Substance Use Topics   Alcohol use: Yes    Alcohol/week: 1.0 - 2.0 standard drinks    Types: 1 - 2 Shots of liquor per week    Comment: daily    Drug use: No     Allergies   Buprenorphine hcl and Morphine and related   Review of Systems Review of Systems  All other systems reviewed and are negative.    Physical Exam Updated Vital Signs BP 132/86    Pulse 88    Temp 98.1 F (36.7 C) (Oral)    Resp 19    Ht 1.727 m (5\' 8" )    Wt 112.5 kg    SpO2 98%    BMI 37.71 kg/m   Physical Exam Vitals signs and nursing note reviewed.  Constitutional:      General: He is not in acute distress.    Appearance: He is well-developed. He is obese. He is not ill-appearing.  HENT:     Head: Normocephalic.     Mouth/Throat:     Mouth: Mucous membranes are moist.  Eyes:     Pupils: Pupils are equal, round, and reactive to light.  Neck:     Musculoskeletal: Normal range of motion.    Cardiovascular:     Rate and Rhythm: Normal rate.  Pulmonary:     Effort: Pulmonary effort is normal.     Comments: Some expiratory wheeze that appears to be stridor radiating to the left upper lobe Chest:     Chest wall: No mass.  Abdominal:     General: Bowel sounds are normal.  Palpations: Abdomen is soft.  Musculoskeletal: Normal range of motion.     Right lower leg: No edema.     Left lower leg: No edema.  Skin:    General: Skin is warm.     Capillary Refill: Capillary refill takes less than 2 seconds.  Neurological:     General: No focal deficit present.     Mental Status: He is alert. He is disoriented.  Psychiatric:        Mood and Affect: Mood normal.      ED Treatments / Results  Labs (all labs ordered are listed, but only abnormal results are displayed) Labs Reviewed  BASIC METABOLIC PANEL - Abnormal; Notable for the following components:      Result Value   Glucose, Bld 160 (*)    Creatinine, Ser 1.30 (*)    GFR calc non Af Amer 58 (*)    All other components within normal limits  CBC  PROTIME-INR    EKG EKG Interpretation  Date/Time:  Monday October 03 2018 13:18:47 EDT Ventricular Rate:  71 PR Interval:    QRS Duration: 105 QT Interval:  405 QTC Calculation: 441 R Axis:   52 Text Interpretation:  Normal sinus rhythm Normal ECG Confirmed by Pattricia Boss [phone]) on 10/03/2018 3:14:33 PM   Radiology Dg Chest 2 View  Result Date: 10/03/2018 CLINICAL DATA:  Shortness of breath. EXAM: CHEST - 2 VIEW COMPARISON:  12/17/2017 FINDINGS: Both lungs are clear. Heart and mediastinum are within normal limits. Trachea is midline. Old left clavicle fracture. Degenerative changes along the anterior thoracic spine. No large pleural effusions. Old bilateral rib fractures. IMPRESSION: No active cardiopulmonary disease. Electronically Signed   By: Markus Daft M.D.   On: 10/03/2018 12:14   Ct Angio Chest Pe W And/or Wo Contrast  Result Date: 10/03/2018 CLINICAL DATA:   Shortness of breath and weakness EXAM: CT ANGIOGRAPHY CHEST WITH CONTRAST TECHNIQUE: Multidetector CT imaging of the chest was performed using the standard protocol during bolus administration of intravenous contrast. Multiplanar CT image reconstructions and MIPs were obtained to evaluate the vascular anatomy. CONTRAST:  18mL OMNIPAQUE IOHEXOL 350 MG/ML SOLN COMPARISON:  The 12/17/2017 FINDINGS: Cardiovascular: Mild cardiac enlargement. Aortic atherosclerosis. Calcification in the LAD coronary artery noted. The main pulmonary artery appears patent. No central obstructing pulmonary embolus identified. Evaluation of the lobar and segmental pulmonary arteries limited secondary to respiratory motion artifact. No specific findings identified to suggest a clinically significant pulmonary emboli. Mediastinum/Nodes: Normal appearance of the thyroid gland. Trachea is midline. There is decreased AP diameter of the mid and lower trachea and proximal airways. Normal appearance of the esophagus. No mediastinal adenopathy. Borderline enlarged right hilar lymph nodes are stable measuring up to 1.3 cm. Lungs/Pleura: No pleural effusion. No interstitial edema or airspace consolidation. A mosaic attenuation pattern noted in both lungs. Upper Abdomen: Diffuse hepatic steatosis. No acute abnormality identified. Musculoskeletal: Spondylosis within the thoracic spine. Chronic right 8 posterior rib deformity is again noted. Multiple healed left anterior and lateral rib deformities are also again noted. Review of the MIP images confirms the above findings. IMPRESSION: 1. No evidence for acute pulmonary embolus. 2. Mild mosaic attenuation pattern noted in both lungs which may reflect small airways disease. 3. Decrease AP diameter of the mid and lower trachea and proximal mainstem bronchi suggestive of tracheobronchomalacia which may be secondary to COPD. 4. Aortic Atherosclerosis (ICD10-I70.0). Coronary artery calcifications.  Electronically Signed   By: Kerby Moors M.D.   On: 10/03/2018 15:07  Procedures Procedures (including critical care time)  Medications Ordered in ED Medications  sodium chloride flush (NS) 0.9 % injection 3 mL (3 mLs Intravenous Not Given 10/03/18 1258)     Initial Impression / Assessment and Plan / ED Course  I have reviewed the triage vital signs and the nursing notes.  Pertinent labs & imaging results that were available during my care of the patient were reviewed by me and considered in my medical decision making (see chart for details).      63 year old man COPD, history of DVT, presents today with worsening dyspnea over the past 10 months. Review of labs revealed normal hemoglobin and platelets.  He is hyperglycemic and has creatinine of 1.3 Chest x-Shlomo is clear and shows no evidence of infiltrate Vital signs reveal normal oxygen saturation, heart rate, and blood pressure. Plan EKG and CT angiogram.  If no acute abnormalities, will refer to cardiology and have follow-up with his pulmonologist. CT angio without acute pe Stable nodule Mosaic attenuation c.w. copd Plan short course of steroids and increase use of albuterol. F/U with pulmonary  Final Clinical Impressions(s) / ED Diagnoses   Final diagnoses:  Dyspnea, unspecified type    ED Discharge Orders    None       Pattricia Boss, MD 10/03/18 1530

## 2018-10-03 NOTE — Discharge Instructions (Signed)
Your CT today did not show any evidence of blood clot, change in nodule, or other acute abnormality Please take prednisone as prescribed Use your albuterol inhaler up to every 4 hours while awake Call your pulmonologist today for follow-up appointment You are given referral to cardiology

## 2018-10-06 ENCOUNTER — Other Ambulatory Visit: Payer: Medicare HMO

## 2018-10-06 ENCOUNTER — Other Ambulatory Visit: Payer: Self-pay

## 2018-10-06 ENCOUNTER — Ambulatory Visit
Admission: RE | Admit: 2018-10-06 | Discharge: 2018-10-06 | Disposition: A | Discharge status: Home / Self Care | Payer: Medicare HMO | Source: Ambulatory Visit | Attending: Pain Medicine | Admitting: Pain Medicine

## 2018-10-06 DIAGNOSIS — M545 Low back pain, unspecified: Secondary | ICD-10-CM

## 2018-10-14 ENCOUNTER — Telehealth: Payer: Self-pay | Admitting: Interventional Cardiology

## 2018-10-14 NOTE — Telephone Encounter (Signed)
Attempted to contact pt to screen for COVID for 6/22 appt.  Left detailed message letting pt know that he will be screened in the first floor lobby when he arrives.
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
## 2018-10-17 ENCOUNTER — Encounter: Payer: Self-pay | Admitting: Interventional Cardiology

## 2018-10-17 ENCOUNTER — Ambulatory Visit: Payer: Medicare HMO | Admitting: Interventional Cardiology

## 2018-10-17 ENCOUNTER — Other Ambulatory Visit: Payer: Self-pay

## 2018-10-17 VITALS — BP 124/82 | HR 85 | Ht 68.0 in | Wt 248.4 lb

## 2018-10-17 DIAGNOSIS — I10 Essential (primary) hypertension: Secondary | ICD-10-CM

## 2018-10-17 DIAGNOSIS — J449 Chronic obstructive pulmonary disease, unspecified: Secondary | ICD-10-CM | POA: Diagnosis not present

## 2018-10-17 DIAGNOSIS — R0609 Other forms of dyspnea: Secondary | ICD-10-CM | POA: Diagnosis not present

## 2018-10-17 DIAGNOSIS — I2510 Atherosclerotic heart disease of native coronary artery without angina pectoris: Secondary | ICD-10-CM

## 2018-10-17 DIAGNOSIS — Z7189 Other specified counseling: Secondary | ICD-10-CM

## 2018-10-17 MED ORDER — METOPROLOL TARTRATE 100 MG PO TABS: ORAL_TABLET | ORAL | 0 refills | Status: DC

## 2018-10-17 NOTE — Patient Instructions (Addendum)
Medication Instructions:  Your physician recommends that you continue on your current medications as directed. Please refer to the Current Medication list given to you today.  If you need a refill on your cardiac medications before your next appointment, please call your pharmacy.   Lab work: None  If you have labs (blood work) drawn today and your tests are completely normal, you will receive your results only by: Marland Kitchen MyChart Message (if you have MyChart) OR . A paper copy in the mail If you have any lab test that is abnormal or we need to change your treatment, we will call you to review the results.  Testing/Procedures: Your physician recommends that you have a Coronary CT performed.   Follow-Up: Your physician recommends that you schedule a follow-up appointment as needed with Dr. Tamala Julian.    Any Other Special Instructions Will Be Listed Below (If Applicable).  Please arrive at the [facility] main entrance of [facility] 30-45 minutes prior to test start time  [facility] [place], [place] [address] [phone]  Proceed to the [facility] Radiology Department (First Floor).  Please follow these instructions carefully (unless otherwise directed):  Hold all erectile dysfunction medications at least 48 hours prior to test.  On the Night Before the Test: . Be sure to Drink plenty of water. . Do not consume any caffeinated/decaffeinated beverages or chocolate 12 hours prior to your test. . Do not take any antihistamines 12 hours prior to your test. . If you take Metformin do not take 24 hours prior to test.  On the Day of the Test: . Drink plenty of water. Do not drink any water within one hour of the test. . Do not eat any food 4 hours prior to the test. . You may take your regular medications prior to the test.  . Take metoprolol (Lopressor) two hours prior to test. . HOLD Furosemide/Hydrochlorothiazide morning of the test.        After the Test: . Drink plenty of water. . After receiving IV contrast, you may experience a mild flushed feeling. This is normal. . On occasion, you may experience a mild rash up to 24 hours after the test. This is not dangerous. If this occurs, you can take Benadryl 25 mg and increase your fluid intake. . If you experience trouble breathing, this can be serious. If it is severe call 911 IMMEDIATELY. If it is mild, please call our office. . If you take any of these medications: Glipizide/Metformin, Avandament, Glucavance, please do not take 48 hours after completing test.

## 2018-11-09 ENCOUNTER — Telehealth (HOSPITAL_COMMUNITY): Payer: Self-pay | Admitting: Emergency Medicine

## 2018-11-09 NOTE — Telephone Encounter (Signed)
Left message on voicemail with name and callback number Yakub Lodes RN Navigator Cardiac Imaging [facility] [phone] Office [phone] Cell  

## 2018-11-10 ENCOUNTER — Encounter (HOSPITAL_COMMUNITY): Payer: Self-pay

## 2018-11-10 ENCOUNTER — Other Ambulatory Visit: Payer: Self-pay

## 2018-11-10 ENCOUNTER — Ambulatory Visit (HOSPITAL_COMMUNITY)
Admission: RE | Admit: 2018-11-10 | Discharge: 2018-11-10 | Disposition: A | Discharge status: Home / Self Care | Payer: Medicare HMO | Source: Ambulatory Visit | Attending: Interventional Cardiology | Admitting: Interventional Cardiology

## 2018-11-10 ENCOUNTER — Ambulatory Visit (HOSPITAL_COMMUNITY): Payer: Medicare HMO

## 2018-11-10 DIAGNOSIS — R0609 Other forms of dyspnea: Secondary | ICD-10-CM | POA: Diagnosis not present

## 2018-11-10 DIAGNOSIS — I2510 Atherosclerotic heart disease of native coronary artery without angina pectoris: Secondary | ICD-10-CM | POA: Insufficient documentation

## 2018-11-10 DIAGNOSIS — I10 Essential (primary) hypertension: Secondary | ICD-10-CM

## 2018-11-10 LAB — POCT I-STAT CREATININE: Creatinine, Ser: 1.3 mg/dL — ABNORMAL HIGH (ref 0.61–1.24)

## 2018-11-10 MED ORDER — NITROGLYCERIN 0.4 MG SL SUBL
0.8000 mg | SUBLINGUAL_TABLET | Freq: Once | SUBLINGUAL | Status: AC
  Filled 2018-11-10: qty 25

## 2018-11-10 MED ORDER — IOHEXOL 350 MG/ML SOLN
100.0000 mL | Freq: Once | INTRAVENOUS | Status: AC | PRN
  Administered 2018-11-10: 14:00:00 100 mL via INTRAVENOUS

## 2018-11-10 MED ORDER — NITROGLYCERIN 0.4 MG SL SUBL
SUBLINGUAL_TABLET | SUBLINGUAL | Status: AC
  Administered 2018-11-10: 0.8 mg
  Filled 2018-11-10: qty 2

## 2018-11-11 ENCOUNTER — Ambulatory Visit (HOSPITAL_COMMUNITY): Payer: Medicare HMO

## 2018-11-14 ENCOUNTER — Telehealth: Payer: Self-pay | Admitting: *Deleted

## 2018-11-14 DIAGNOSIS — I2510 Atherosclerotic heart disease of native coronary artery without angina pectoris: Secondary | ICD-10-CM

## 2018-11-14 NOTE — Telephone Encounter (Signed)
-----   Message from Belva Crome, MD sent at 11/11/2018  1:10 PM EDT ----- Let the patient know he has mild plaque buildup in is LAD artery. Not severe and I don't believe heart is contributing to dyspnea. He needs to start therapy to prevent plaque from getting worse and causing future problems. He needs a Lipid panel, Liver panel and hs CRP. If LDL( bad) cholesterol > 100, will need to start therapy for prevention. Will need follow-up which could be virtual after we decide about therapy... A copy will be sent to Jaynee Eagles, PA-C

## 2018-11-14 NOTE — Telephone Encounter (Signed)
Spoke with pt and went over results and recommendations.  Pt verbalized understanding and was in agreement with plan.  He will come for labs on 7/22.

## 2018-11-16 ENCOUNTER — Other Ambulatory Visit: Payer: Medicare HMO | Admitting: *Deleted

## 2018-11-16 ENCOUNTER — Other Ambulatory Visit: Payer: Self-pay

## 2018-11-16 DIAGNOSIS — I2510 Atherosclerotic heart disease of native coronary artery without angina pectoris: Secondary | ICD-10-CM

## 2018-11-16 LAB — LIPID PANEL
Chol/HDL Ratio: 5 ratio (ref 0.0–5.0)
Cholesterol, Total: 198 mg/dL (ref 100–199)
HDL: 40 mg/dL (ref >39)
LDL Calculated: 136 mg/dL — ABNORMAL HIGH (ref 0–99)
Triglycerides: 109 mg/dL (ref 0–149)
VLDL Cholesterol Cal: 22 mg/dL (ref 5–40)

## 2018-11-16 LAB — HEPATIC FUNCTION PANEL
ALT: 37 IU/L (ref 0–44)
AST: 25 IU/L (ref 0–40)
Albumin: 4.5 g/dL (ref 3.8–4.8)
Alkaline Phosphatase: 79 IU/L (ref 39–117)
Bilirubin Total: 0.2 mg/dL (ref 0.0–1.2)
Bilirubin, Direct: 0.1 mg/dL (ref 0.00–0.40)
Total Protein: 6.8 g/dL (ref 6.0–8.5)

## 2018-11-16 LAB — HIGH SENSITIVITY CRP: CRP, High Sensitivity: 15.36 mg/L — ABNORMAL HIGH (ref 0.00–3.00)

## 2018-11-17 ENCOUNTER — Telehealth: Payer: Self-pay | Admitting: *Deleted

## 2018-11-17 DIAGNOSIS — E785 Hyperlipidemia, unspecified: Secondary | ICD-10-CM

## 2018-11-17 MED ORDER — ROSUVASTATIN CALCIUM 20 MG PO TABS: 20.0000 mg | ORAL_TABLET | Freq: Every day | ORAL | 3 refills | Status: DC

## 2018-11-17 NOTE — Telephone Encounter (Signed)
Spoke with pt and went over results and recommendations.  Pt will come for labs on 9/24. Pt verbalized understanding and was in agreement with plan.

## 2018-11-17 NOTE — Telephone Encounter (Signed)
-----   Message from Belva Crome, MD sent at 11/16/2018  7:16 PM EDT ----- Let the patient know the LDL is too high. Start rosuvastatin 20 mg daily. Repeat Lipid and liver in 2 months. A copy will be sent to Jaynee Eagles, PA-C

## 2019-01-19 ENCOUNTER — Other Ambulatory Visit: Payer: Medicare HMO

## 2019-01-19 ENCOUNTER — Encounter (INDEPENDENT_AMBULATORY_CARE_PROVIDER_SITE_OTHER): Payer: Self-pay

## 2019-01-19 ENCOUNTER — Other Ambulatory Visit: Payer: Self-pay

## 2019-01-19 ENCOUNTER — Other Ambulatory Visit: Payer: Medicare HMO | Admitting: *Deleted

## 2019-01-19 DIAGNOSIS — E785 Hyperlipidemia, unspecified: Secondary | ICD-10-CM

## 2019-01-19 LAB — LIPID PANEL
Chol/HDL Ratio: 2.7 ratio (ref 0.0–5.0)
Cholesterol, Total: 106 mg/dL (ref 100–199)
HDL: 39 mg/dL — ABNORMAL LOW (ref >39)
LDL Chol Calc (NIH): 48 mg/dL (ref 0–99)
Triglycerides: 102 mg/dL (ref 0–149)
VLDL Cholesterol Cal: 19 mg/dL (ref 5–40)

## 2019-01-19 LAB — HEPATIC FUNCTION PANEL
ALT: 36 IU/L (ref 0–44)
AST: 32 IU/L (ref 0–40)
Albumin: 4.3 g/dL (ref 3.8–4.8)
Alkaline Phosphatase: 92 IU/L (ref 39–117)
Bilirubin Total: 0.3 mg/dL (ref 0.0–1.2)
Bilirubin, Direct: 0.13 mg/dL (ref 0.00–0.40)
Total Protein: 6.7 g/dL (ref 6.0–8.5)

## 2019-01-20 ENCOUNTER — Telehealth: Payer: Self-pay

## 2019-01-20 DIAGNOSIS — E785 Hyperlipidemia, unspecified: Secondary | ICD-10-CM

## 2019-01-20 DIAGNOSIS — Z79899 Other long term (current) drug therapy: Secondary | ICD-10-CM

## 2019-01-20 DIAGNOSIS — I2510 Atherosclerotic heart disease of native coronary artery without angina pectoris: Secondary | ICD-10-CM

## 2019-01-20 NOTE — Telephone Encounter (Signed)
LMTCB

## 2019-01-20 NOTE — Telephone Encounter (Signed)
-----   Message from Belva Crome, MD sent at 01/19/2019  4:51 PM EDT ----- Let the patient know the labs are perfect. Repeat 6-9 months. A copy will be sent to Jaynee Eagles, PA-C

## 2019-01-20 NOTE — Telephone Encounter (Signed)
Pt called and will mark his calendar for about 6 months for repeat Lipid and Liver.. fasting.

## 2019-06-12 ENCOUNTER — Other Ambulatory Visit: Payer: Self-pay | Admitting: Neurosurgery

## 2019-06-30 NOTE — Progress Notes (Signed)
[facility] - [place], [place] - [address] [place] [address] Phone: [phone] Fax: [phone]      Your procedure is scheduled on Wednesday 07/05/2019.  Report to [facility] Main Entrance "A" at 07:00 A.M., and check in at the Admitting office.  Call this number if you have problems the morning of surgery:  [phone]  Call [phone] if you have any questions prior to your surgery date Monday-Friday 8am-4pm    Remember:  Do not eat or drink after midnight the night before your surgery    Take these medicines the morning of surgery with A SIP OF WATER: Albuterol (Ventolin) inhaler - if needed Amlodipine (Norvasc) Gabapentin (Neurontin) Hydrocodone-acetaminophen (Norco) - if needed Omeprazole (Prilosec) Rosuvastatin (Crestor) Tamsulosin (Flomax)  7 days prior to surgery STOP taking any Aspirin (unless otherwise instructed by your surgeon), Aleve, Naproxen, Ibuprofen, Motrin, Advil, Goody's, BC's, all herbal medications, fish oil, and all vitamins.  Follow your surgeon's instructions on when to stop Aspirin.  If no instructions were given by your surgeon then you will need to call the office to get those instructions.    Please bring all inhalers with you the day of surgery.     The Morning of Surgery  Do not wear jewelry.  Do not wear lotions, powders, colognes, or deodorant  Do not shave 48 hours prior to surgery.  Men may shave face and neck.  Do not bring valuables to the hospital.  [facility] is not responsible for any belongings or valuables.  If you are a smoker, DO NOT Smoke 24 hours prior to surgery  If you wear a CPAP at night please bring your mask the morning of surgery   Remember that you must have someone to transport you home after your surgery, and remain with you for 24 hours if you are discharged the same day.   Please bring cases for contacts, glasses, hearing aids, dentures  or bridgework because it cannot be worn into surgery.    Leave your suitcase in the car.  After surgery it may be brought to your room.  For patients admitted to the hospital, discharge time will be determined by your treatment team.  Patients discharged the day of surgery will not be allowed to drive home.    Special instructions:   [facility]- Preparing For Surgery  Before surgery, you can play an important role. Because skin is not sterile, your skin needs to be as free of germs as possible. You can reduce the number of germs on your skin by washing with CHG (chlorahexidine gluconate) Soap before surgery.  CHG is an antiseptic cleaner which kills germs and bonds with the skin to continue killing germs even after washing.    Oral Hygiene is also important to reduce your risk of infection.  Remember - BRUSH YOUR TEETH THE MORNING OF SURGERY WITH YOUR REGULAR TOOTHPASTE  Please do not use if you have an allergy to CHG or antibacterial soaps. If your skin becomes reddened/irritated stop using the CHG.  Do not shave (including legs and underarms) for at least 48 hours prior to first CHG shower. It is OK to shave your face.  Please follow these instructions carefully.   1. Shower the NIGHT BEFORE SURGERY and the MORNING OF SURGERY with CHG Soap.   2. If you chose to wash your hair, wash your hair first as usual with your normal shampoo.  3. After you shampoo,  rinse your hair and body thoroughly to remove the shampoo.  4. Use CHG as you would any other liquid soap. You can apply CHG directly to the skin and wash gently with a scrungie or a clean washcloth.   5. Apply the CHG Soap to your body ONLY FROM THE NECK DOWN.  Do not use on open wounds or open sores. Avoid contact with your eyes, ears, mouth and genitals (private parts). Wash Face and genitals (private parts)  with your normal soap.   6. Wash thoroughly, paying special attention to the area where your surgery will be  performed.  7. Thoroughly rinse your body with warm water from the neck down.  8. DO NOT shower/wash with your normal soap after using and rinsing off the CHG Soap.  9. Pat yourself dry with a CLEAN TOWEL.  10. Wear CLEAN PAJAMAS to bed the night before surgery, wear comfortable clothes the morning of surgery  11. Place CLEAN SHEETS on your bed the night of your first shower and DO NOT SLEEP WITH PETS.    Day of Surgery:  Please shower the morning of surgery with the CHG soap Do not apply any deodorants/lotions. Please wear clean clothes to the hospital/surgery center.   Remember to brush your teeth WITH YOUR REGULAR TOOTHPASTE.   Please read over the following fact sheets that you were given.

## 2019-07-03 ENCOUNTER — Encounter (HOSPITAL_COMMUNITY)
Admission: RE | Admit: 2019-07-03 | Discharge: 2019-07-03 | Disposition: A | Discharge status: Home / Self Care | Payer: Medicare HMO | Source: Ambulatory Visit | Attending: Neurosurgery | Admitting: Neurosurgery

## 2019-07-03 ENCOUNTER — Encounter (HOSPITAL_COMMUNITY): Payer: Self-pay

## 2019-07-03 ENCOUNTER — Other Ambulatory Visit: Payer: Self-pay

## 2019-07-03 ENCOUNTER — Other Ambulatory Visit (HOSPITAL_COMMUNITY)
Admission: RE | Admit: 2019-07-03 | Discharge: 2019-07-03 | Disposition: A | Discharge status: Home / Self Care | Payer: Medicare HMO | Source: Ambulatory Visit | Attending: Neurosurgery | Admitting: Neurosurgery

## 2019-07-03 HISTORY — DX: Personal history of urinary calculi: Z87.442

## 2019-07-03 LAB — TYPE AND SCREEN
ABO/RH(D): B POS
Antibody Screen: NEGATIVE

## 2019-07-03 LAB — CBC
HCT: 43.7 % (ref 39.0–52.0)
Hemoglobin: 13.2 g/dL (ref 13.0–17.0)
MCH: 25.4 pg — ABNORMAL LOW (ref 26.0–34.0)
MCHC: 30.2 g/dL (ref 30.0–36.0)
MCV: 84.2 fL (ref 80.0–100.0)
Platelets: 203 10*3/uL (ref 150–400)
RBC: 5.19 MIL/uL (ref 4.22–5.81)
RDW: 14.2 % (ref 11.5–15.5)
WBC: 7.7 10*3/uL (ref 4.0–10.5)
nRBC: 0 % (ref 0.0–0.2)

## 2019-07-03 LAB — BASIC METABOLIC PANEL
Anion gap: 10 (ref 5–15)
BUN: 16 mg/dL (ref 8–23)
CO2: 25 mmol/L (ref 22–32)
Calcium: 9.3 mg/dL (ref 8.9–10.3)
Chloride: 105 mmol/L (ref 98–111)
Creatinine, Ser: 1.15 mg/dL (ref 0.61–1.24)
GFR calc Af Amer: >60 mL/min (ref >60)
GFR calc non Af Amer: >60 mL/min (ref >60)
Glucose, Bld: 110 mg/dL — ABNORMAL HIGH (ref 70–99)
Potassium: 3.7 mmol/L (ref 3.5–5.1)
Sodium: 140 mmol/L (ref 135–145)

## 2019-07-03 LAB — ABO/RH: ABO/RH(D): B POS

## 2019-07-03 LAB — SURGICAL PCR SCREEN
MRSA, PCR: NEGATIVE
Staphylococcus aureus: NEGATIVE

## 2019-07-03 NOTE — Progress Notes (Signed)
PCP - [facility] on [address], states the providers change often Cardiologist - Dr. Tamala Julian  PPM/ICD - n/a Device Orders -  Rep Notified -   Chest x-Justin - 10/03/2018 EKG - 10/04/2018 Stress Test - patient denies ECHO - 02/10/2018 Cardiac Cath - patient denies  Sleep Study - patient states it was years and years ago and is unsure who did it or where it was performed CPAP - yes, instructed to bring DOS  Fasting Blood Sugar - n/a Checks Blood Sugar _____ times a day  Blood Thinner Instructions: Aspirin Instructions: last dose 06/27/2019  ERAS Protcol - n/a PRE-SURGERY Ensure or G2-   COVID TEST- scheduled after PAT appointment 07/03/19   Anesthesia review: yes, history of Echo  Patient denies shortness of breath, fever, cough and chest pain at PAT appointment   All instructions explained to the patient, with a verbal understanding of the material. Patient agrees to go over the instructions while at home for a better understanding. Patient also instructed to self quarantine after being tested for COVID-19. The opportunity to ask questions was provided.

## 2019-07-04 LAB — SARS CORONAVIRUS 2 (TAT 6-24 HRS): SARS Coronavirus 2: NEGATIVE

## 2019-07-04 NOTE — Anesthesia Preprocedure Evaluation (Addendum)
Anesthesia Evaluation  Patient identified by MRN, date of birth, ID band Patient awake    Reviewed: Allergy & Precautions, NPO status , Patient's Chart, lab work & pertinent test results  Airway Mallampati: II  TM Distance: >3 FB Neck ROM: Full    Dental no notable dental hx.    Pulmonary sleep apnea and Continuous Positive Airway Pressure Ventilation , COPD, former smoker,    Pulmonary exam normal breath sounds clear to auscultation       Cardiovascular hypertension, Normal cardiovascular exam Rhythm:Regular Rate:Normal     Neuro/Psych negative neurological ROS  negative psych ROS   GI/Hepatic Neg liver ROS, GERD  ,  Endo/Other  Morbid obesity  Renal/GU negative Renal ROS  negative genitourinary   Musculoskeletal negative musculoskeletal ROS (+)   Abdominal   Peds negative pediatric ROS (+)  Hematology negative hematology ROS (+)   Anesthesia Other Findings   Reproductive/Obstetrics negative OB ROS                            Anesthesia Physical Anesthesia Plan  ASA: III  Anesthesia Plan: General   Post-op Pain Management:    Induction: Intravenous  PONV Risk Score and Plan: 2 and Ondansetron, Dexamethasone and Treatment may vary due to age or medical condition  Airway Management Planned: Oral ETT  Additional Equipment:   Intra-op Plan:   Post-operative Plan: Extubation in OR  Informed Consent: I have reviewed the patients History and Physical, chart, labs and discussed the procedure including the risks, benefits and alternatives for the proposed anesthesia with the patient or authorized representative who has indicated his/her understanding and acceptance.     Dental advisory given  Plan Discussed with: CRNA and Surgeon  Anesthesia Plan Comments: (Follows with cardiologist Dr. Tamala Julian for hx of DOE and coronary calcifications on CT. Echo  2019 showed normal LV systolic  function.  Right heart function was also normal. Pt was last seen by Dr. Tamala Julian 10/17/18 and at that time Coronary CTA with FFR was ordered to exclude ischemia as cause for his DOE. Scan done 11/10/18 showed minimal non-obstructive CAD. Risk factor modification was recommended.   Follows with pulmonology for OSA on CPAP and COP. Per note by Dr. Melvyn Novas 02/23/18, pts DOE likely more related to obesity/conditioning rather than airflow limitation.   Preop labs reviewed, unremarkable.   EKG 10/03/18: Sinus rhythm, Rate 71.  Coronary CTA with FFR 11/10/18: IMPRESSION: 1. Coronary calcium score of 42. This was 8 percentile for age and sex matched control.  2. Normal coronary origin with right dominance.  3. Minimal non-obstructive CAD. CAD RADS 1. Risk factor modification is recommended  TTE 02/10/18: - Left ventricle: The cavity size was normal. There was moderate  concentric hypertrophy. Systolic function was normal. The  estimated ejection fraction was in the range of 55% to 60%.  Although no diagnostic regional wall motion abnormality was  identified, this possibility cannot be completely excluded on the  basis of this study. Doppler parameters are consistent with  abnormal left ventricular relaxation (grade 1 diastolic  dysfunction).  - Left atrium: The atrium was mildly dilated.   Recent Review Flowsheet Data   Spirometry Latest Ref Rng & Units 02/22/2018  FVC-%PRED-PRE % 84  FVC-%PRED-POST % 86  FEV1-PRE L 2.76  FEV1-%PRED-PRE % 89  FEV1-POST L 2.88  FEV1-%PRED-POST % 93  DLCO [facility] ml/min/mmHg 26.54  )       Anesthesia Quick Evaluation

## 2019-07-04 NOTE — Progress Notes (Signed)
Anesthesia Chart Review:  Follows with cardiologist Dr. Tamala Julian for hx of DOE and coronary calcifications on CT. Echo  2019 showed normal LV systolic function.  Right heart function was also normal. Pt was last seen by Dr. Tamala Julian 10/17/18 and at that time Coronary CTA with FFR was ordered to exclude ischemia as cause for his DOE. Scan done 11/10/18 showed minimal non-obstructive CAD. Risk factor modification was recommended.   Follows with pulmonology for OSA on CPAP and COP. Per note by Dr. Melvyn Novas 02/23/18, pts DOE likely more related to obesity/conditioning rather than airflow limitation.   Preop labs reviewed, unremarkable.   EKG 10/03/18: Sinus rhythm, Rate 71.  Coronary CTA with FFR 11/10/18: IMPRESSION: 1. Coronary calcium score of 42. This was 81 percentile for age and sex matched control.  2. Normal coronary origin with right dominance.  3. Minimal non-obstructive CAD. CAD RADS 1. Risk factor modification is recommended  TTE 02/10/18: - Left ventricle: The cavity size was normal. There was moderate  concentric hypertrophy. Systolic function was normal. The  estimated ejection fraction was in the range of 55% to 60%.  Although no diagnostic regional wall motion abnormality was  identified, this possibility cannot be completely excluded on the  basis of this study. Doppler parameters are consistent with  abnormal left ventricular relaxation (grade 1 diastolic  dysfunction).  - Left atrium: The atrium was mildly dilated.   Recent Review Flowsheet Data    Spirometry Latest Ref Rng & Units 02/22/2018   FVC-%PRED-PRE % 84   FVC-%PRED-POST % 86   FEV1-PRE L 2.76   FEV1-%PRED-PRE % 89   FEV1-POST L 2.88   FEV1-%PRED-POST % 93   DLCO [facility] ml/min/mmHg 26.54     Wynonia Musty [facility] Short Stay Center/Anesthesiology Phone [phone] 07/04/2019 11:10 AM

## 2019-07-05 ENCOUNTER — Encounter (HOSPITAL_COMMUNITY)
Admission: RE | Disposition: A | Discharge status: Home / Self Care | Payer: Self-pay | Source: Home / Self Care | Attending: Neurosurgery

## 2019-07-05 ENCOUNTER — Encounter (HOSPITAL_COMMUNITY): Payer: Self-pay | Admitting: Neurosurgery

## 2019-07-05 ENCOUNTER — Other Ambulatory Visit: Payer: Self-pay

## 2019-07-05 ENCOUNTER — Inpatient Hospital Stay (HOSPITAL_COMMUNITY): Payer: Medicare HMO

## 2019-07-05 ENCOUNTER — Inpatient Hospital Stay (HOSPITAL_COMMUNITY)
Admission: RE | Admit: 2019-07-05 | Discharge: 2019-07-06 | DRG: 460 | Disposition: A | Discharge status: Home / Self Care | Payer: Medicare HMO | Attending: Neurosurgery | Admitting: Neurosurgery

## 2019-07-05 ENCOUNTER — Inpatient Hospital Stay (HOSPITAL_COMMUNITY): Payer: Medicare HMO | Admitting: Physician Assistant

## 2019-07-05 ENCOUNTER — Inpatient Hospital Stay (HOSPITAL_COMMUNITY): Payer: Medicare HMO | Admitting: Certified Registered Nurse Anesthetist

## 2019-07-05 DIAGNOSIS — I10 Essential (primary) hypertension: Secondary | ICD-10-CM | POA: Diagnosis present

## 2019-07-05 DIAGNOSIS — Z20822 Contact with and (suspected) exposure to covid-19: Secondary | ICD-10-CM | POA: Diagnosis present

## 2019-07-05 DIAGNOSIS — Z96642 Presence of left artificial hip joint: Secondary | ICD-10-CM | POA: Diagnosis present

## 2019-07-05 DIAGNOSIS — M4726 Other spondylosis with radiculopathy, lumbar region: Secondary | ICD-10-CM | POA: Diagnosis present

## 2019-07-05 DIAGNOSIS — M48062 Spinal stenosis, lumbar region with neurogenic claudication: Secondary | ICD-10-CM | POA: Diagnosis present

## 2019-07-05 DIAGNOSIS — E7800 Pure hypercholesterolemia, unspecified: Secondary | ICD-10-CM | POA: Diagnosis present

## 2019-07-05 DIAGNOSIS — Z8042 Family history of malignant neoplasm of prostate: Secondary | ICD-10-CM | POA: Diagnosis not present

## 2019-07-05 DIAGNOSIS — M4807 Spinal stenosis, lumbosacral region: Secondary | ICD-10-CM | POA: Diagnosis present

## 2019-07-05 DIAGNOSIS — K219 Gastro-esophageal reflux disease without esophagitis: Secondary | ICD-10-CM | POA: Diagnosis present

## 2019-07-05 DIAGNOSIS — Z419 Encounter for procedure for purposes other than remedying health state, unspecified: Secondary | ICD-10-CM

## 2019-07-05 SURGERY — POSTERIOR LUMBAR FUSION 1 LEVEL
Anesthesia: General | Laterality: Right

## 2019-07-05 MED ORDER — HYDROCODONE-ACETAMINOPHEN 7.5-325 MG PO TABS
1.0000 | ORAL_TABLET | Freq: Four times a day (QID) | ORAL | Status: DC
  Administered 2019-07-05 – 2019-07-06 (×2): 1 via ORAL
  Filled 2019-07-05 (×2): qty 1

## 2019-07-05 MED ORDER — ROCURONIUM BROMIDE 10 MG/ML (PF) SYRINGE
PREFILLED_SYRINGE | INTRAVENOUS | Status: DC | PRN
  Administered 2019-07-05: 70 mg via INTRAVENOUS
  Administered 2019-07-05: 30 mg via INTRAVENOUS

## 2019-07-05 MED ORDER — HYDROMORPHONE HCL 1 MG/ML IJ SOLN: 0.2500 mg | INTRAMUSCULAR | Status: DC | PRN

## 2019-07-05 MED ORDER — ONDANSETRON HCL 4 MG/2ML IJ SOLN: 4.0000 mg | Freq: Four times a day (QID) | INTRAMUSCULAR | Status: DC | PRN

## 2019-07-05 MED ORDER — 0.9 % SODIUM CHLORIDE (POUR BTL) OPTIME
TOPICAL | Status: DC | PRN
  Administered 2019-07-05: 1000 mL

## 2019-07-05 MED ORDER — METHYLPREDNISOLONE ACETATE 80 MG/ML IJ SUSP
INTRAMUSCULAR | Status: AC
  Filled 2019-07-05: qty 1

## 2019-07-05 MED ORDER — PHENYLEPHRINE 40 MCG/ML (10ML) SYRINGE FOR IV PUSH (FOR BLOOD PRESSURE SUPPORT)
PREFILLED_SYRINGE | INTRAVENOUS | Status: DC | PRN
  Administered 2019-07-05: 120 ug via INTRAVENOUS
  Administered 2019-07-05: 80 ug via INTRAVENOUS
  Administered 2019-07-05: 120 ug via INTRAVENOUS

## 2019-07-05 MED ORDER — FENTANYL CITRATE (PF) 250 MCG/5ML IJ SOLN
INTRAMUSCULAR | Status: DC | PRN
  Administered 2019-07-05 (×3): 50 ug via INTRAVENOUS
  Administered 2019-07-05: 100 ug via INTRAVENOUS

## 2019-07-05 MED ORDER — PROPOFOL 10 MG/ML IV BOLUS
INTRAVENOUS | Status: AC
  Filled 2019-07-05: qty 40

## 2019-07-05 MED ORDER — HYDROMORPHONE HCL 1 MG/ML IJ SOLN
1.0000 mg | INTRAMUSCULAR | Status: DC | PRN
  Administered 2019-07-05: 1 mg via INTRAVENOUS
  Filled 2019-07-05: qty 1

## 2019-07-05 MED ORDER — ONDANSETRON HCL 4 MG/2ML IJ SOLN
INTRAMUSCULAR | Status: AC
  Filled 2019-07-05: qty 2

## 2019-07-05 MED ORDER — PHENOL 1.4 % MT LIQD: 1.0000 | OROMUCOSAL | Status: DC | PRN

## 2019-07-05 MED ORDER — BUPIVACAINE HCL (PF) 0.5 % IJ SOLN
INTRAMUSCULAR | Status: AC
  Filled 2019-07-05: qty 30

## 2019-07-05 MED ORDER — CELECOXIB 200 MG PO CAPS
200.0000 mg | ORAL_CAPSULE | Freq: Two times a day (BID) | ORAL | Status: DC
  Administered 2019-07-05 – 2019-07-06 (×3): 200 mg via ORAL
  Filled 2019-07-05 (×3): qty 1

## 2019-07-05 MED ORDER — AMLODIPINE BESYLATE 10 MG PO TABS
10.0000 mg | ORAL_TABLET | Freq: Every day | ORAL | Status: DC
  Administered 2019-07-06: 10 mg via ORAL
  Filled 2019-07-05: qty 1
  Filled 2019-07-05: qty 2

## 2019-07-05 MED ORDER — FENTANYL CITRATE (PF) 100 MCG/2ML IJ SOLN
INTRAMUSCULAR | Status: DC | PRN
  Administered 2019-07-05: 100 ug via INTRAVENOUS

## 2019-07-05 MED ORDER — BISACODYL 5 MG PO TBEC: 5.0000 mg | DELAYED_RELEASE_TABLET | Freq: Every day | ORAL | Status: DC | PRN

## 2019-07-05 MED ORDER — ACETAMINOPHEN 650 MG RE SUPP: 650.0000 mg | RECTAL | Status: DC | PRN

## 2019-07-05 MED ORDER — MENTHOL 3 MG MT LOZG: 1.0000 | LOZENGE | OROMUCOSAL | Status: DC | PRN

## 2019-07-05 MED ORDER — HYDROCODONE-ACETAMINOPHEN 7.5-325 MG PO TABS
1.0000 | ORAL_TABLET | ORAL | Status: DC | PRN
  Administered 2019-07-05 – 2019-07-06 (×3): 1 via ORAL
  Filled 2019-07-05 (×3): qty 1

## 2019-07-05 MED ORDER — FENTANYL CITRATE (PF) 250 MCG/5ML IJ SOLN
INTRAMUSCULAR | Status: AC
  Filled 2019-07-05: qty 5

## 2019-07-05 MED ORDER — SODIUM CHLORIDE 0.9% FLUSH: 3.0000 mL | INTRAVENOUS | Status: DC | PRN

## 2019-07-05 MED ORDER — THROMBIN 20000 UNITS EX SOLR
CUTANEOUS | Status: DC | PRN
  Administered 2019-07-05: 20 mL via TOPICAL

## 2019-07-05 MED ORDER — SENNA 8.6 MG PO TABS
1.0000 | ORAL_TABLET | Freq: Two times a day (BID) | ORAL | Status: DC
  Administered 2019-07-05 – 2019-07-06 (×2): 8.6 mg via ORAL
  Filled 2019-07-05 (×2): qty 1

## 2019-07-05 MED ORDER — MIDAZOLAM HCL 2 MG/2ML IJ SOLN
INTRAMUSCULAR | Status: AC
  Filled 2019-07-05: qty 2

## 2019-07-05 MED ORDER — CHLORHEXIDINE GLUCONATE CLOTH 2 % EX PADS: 6.0000 | MEDICATED_PAD | Freq: Once | CUTANEOUS | Status: DC

## 2019-07-05 MED ORDER — ONDANSETRON HCL 4 MG/2ML IJ SOLN
INTRAMUSCULAR | Status: DC | PRN
  Administered 2019-07-05: 4 mg via INTRAVENOUS

## 2019-07-05 MED ORDER — FLUOXETINE HCL 20 MG PO CAPS
20.0000 mg | ORAL_CAPSULE | Freq: Every day | ORAL | Status: DC
  Administered 2019-07-05: 20 mg via ORAL
  Filled 2019-07-05: qty 1

## 2019-07-05 MED ORDER — SODIUM CHLORIDE 0.9 % IV SOLN: 250.0000 mL | INTRAVENOUS | Status: DC

## 2019-07-05 MED ORDER — PHENYLEPHRINE 40 MCG/ML (10ML) SYRINGE FOR IV PUSH (FOR BLOOD PRESSURE SUPPORT)
PREFILLED_SYRINGE | INTRAVENOUS | Status: AC
  Filled 2019-07-05: qty 10

## 2019-07-05 MED ORDER — PHENYLEPHRINE HCL-NACL 10-0.9 MG/250ML-% IV SOLN
INTRAVENOUS | Status: DC | PRN
  Administered 2019-07-05: 25 ug/min via INTRAVENOUS

## 2019-07-05 MED ORDER — ROSUVASTATIN CALCIUM 20 MG PO TABS
20.0000 mg | ORAL_TABLET | Freq: Every day | ORAL | Status: DC
  Administered 2019-07-05: 20 mg via ORAL

## 2019-07-05 MED ORDER — MAGNESIUM CITRATE PO SOLN: 1.0000 | Freq: Once | ORAL | Status: DC | PRN

## 2019-07-05 MED ORDER — LIDOCAINE-EPINEPHRINE 0.5 %-1:200000 IJ SOLN
INTRAMUSCULAR | Status: DC | PRN
  Administered 2019-07-05: 7 mL

## 2019-07-05 MED ORDER — ROCURONIUM BROMIDE 10 MG/ML (PF) SYRINGE
PREFILLED_SYRINGE | INTRAVENOUS | Status: AC
  Filled 2019-07-05: qty 10

## 2019-07-05 MED ORDER — BUPIVACAINE HCL (PF) 0.5 % IJ SOLN
INTRAMUSCULAR | Status: DC | PRN
  Administered 2019-07-05: 10 mL

## 2019-07-05 MED ORDER — GLYCOPYRROLATE 0.2 MG/ML IJ SOLN
INTRAMUSCULAR | Status: DC | PRN
  Administered 2019-07-05: .4 mg via INTRAVENOUS

## 2019-07-05 MED ORDER — FENTANYL CITRATE (PF) 100 MCG/2ML IJ SOLN
INTRAMUSCULAR | Status: AC
  Filled 2019-07-05: qty 2

## 2019-07-05 MED ORDER — LIDOCAINE 2% (20 MG/ML) 5 ML SYRINGE
INTRAMUSCULAR | Status: AC
  Filled 2019-07-05: qty 5

## 2019-07-05 MED ORDER — LIDOCAINE-EPINEPHRINE 0.5 %-1:200000 IJ SOLN
INTRAMUSCULAR | Status: AC
  Filled 2019-07-05: qty 1

## 2019-07-05 MED ORDER — DEXAMETHASONE SODIUM PHOSPHATE 10 MG/ML IJ SOLN
INTRAMUSCULAR | Status: DC | PRN
  Administered 2019-07-05: 10 mg via INTRAVENOUS

## 2019-07-05 MED ORDER — DEXAMETHASONE SODIUM PHOSPHATE 10 MG/ML IJ SOLN
INTRAMUSCULAR | Status: AC
  Filled 2019-07-05: qty 1

## 2019-07-05 MED ORDER — GABAPENTIN 300 MG PO CAPS
600.0000 mg | ORAL_CAPSULE | Freq: Two times a day (BID) | ORAL | Status: DC
  Administered 2019-07-05 – 2019-07-06 (×3): 600 mg via ORAL
  Filled 2019-07-05: qty 2
  Filled 2019-07-05: qty 6
  Filled 2019-07-05: qty 2

## 2019-07-05 MED ORDER — DIAZEPAM 5 MG PO TABS
5.0000 mg | ORAL_TABLET | Freq: Four times a day (QID) | ORAL | Status: DC | PRN
  Administered 2019-07-05 – 2019-07-06 (×3): 5 mg via ORAL
  Filled 2019-07-05 (×3): qty 1

## 2019-07-05 MED ORDER — PROMETHAZINE HCL 25 MG/ML IJ SOLN: 6.2500 mg | INTRAMUSCULAR | Status: DC | PRN

## 2019-07-05 MED ORDER — EPHEDRINE SULFATE-NACL 50-0.9 MG/10ML-% IV SOSY
PREFILLED_SYRINGE | INTRAVENOUS | Status: DC | PRN
  Administered 2019-07-05 (×2): 5 mg via INTRAVENOUS
  Administered 2019-07-05: 10 mg via INTRAVENOUS

## 2019-07-05 MED ORDER — HYDROCODONE-ACETAMINOPHEN 10-325 MG PO TABS: 2.0000 | ORAL_TABLET | ORAL | Status: DC | PRN

## 2019-07-05 MED ORDER — EPHEDRINE 5 MG/ML INJ
INTRAVENOUS | Status: AC
  Filled 2019-07-05: qty 10

## 2019-07-05 MED ORDER — HYDROXYZINE HCL 50 MG/ML IM SOLN: 50.0000 mg | Freq: Four times a day (QID) | INTRAMUSCULAR | Status: DC | PRN

## 2019-07-05 MED ORDER — LACTATED RINGERS IV SOLN: INTRAVENOUS | Status: DC

## 2019-07-05 MED ORDER — SENNOSIDES-DOCUSATE SODIUM 8.6-50 MG PO TABS: 1.0000 | ORAL_TABLET | Freq: Every evening | ORAL | Status: DC | PRN

## 2019-07-05 MED ORDER — ACETAMINOPHEN 325 MG PO TABS: 650.0000 mg | ORAL_TABLET | ORAL | Status: DC | PRN

## 2019-07-05 MED ORDER — ASPIRIN EC 81 MG PO TBEC
81.0000 mg | DELAYED_RELEASE_TABLET | Freq: Every day | ORAL | Status: DC
  Administered 2019-07-06: 81 mg via ORAL
  Filled 2019-07-05: qty 1

## 2019-07-05 MED ORDER — CEFAZOLIN SODIUM-DEXTROSE 2-4 GM/100ML-% IV SOLN
2.0000 g | INTRAVENOUS | Status: AC
  Administered 2019-07-05: 2 g via INTRAVENOUS
  Filled 2019-07-05: qty 100

## 2019-07-05 MED ORDER — METHYLPREDNISOLONE ACETATE 80 MG/ML IJ SUSP
INTRAMUSCULAR | Status: DC | PRN
  Administered 2019-07-05: 80 mg

## 2019-07-05 MED ORDER — LIDOCAINE 2% (20 MG/ML) 5 ML SYRINGE
INTRAMUSCULAR | Status: DC | PRN
  Administered 2019-07-05: 60 mg via INTRAVENOUS

## 2019-07-05 MED ORDER — ALBUTEROL SULFATE (2.5 MG/3ML) 0.083% IN NEBU: 2.5000 mg | INHALATION_SOLUTION | Freq: Four times a day (QID) | RESPIRATORY_TRACT | Status: DC | PRN

## 2019-07-05 MED ORDER — TAMSULOSIN HCL 0.4 MG PO CAPS
0.4000 mg | ORAL_CAPSULE | Freq: Every day | ORAL | Status: DC
  Administered 2019-07-06: 0.4 mg via ORAL
  Filled 2019-07-05: qty 1

## 2019-07-05 MED ORDER — PROPOFOL 10 MG/ML IV BOLUS
INTRAVENOUS | Status: DC | PRN
  Administered 2019-07-05: 50 mg via INTRAVENOUS
  Administered 2019-07-05: 140 mg via INTRAVENOUS

## 2019-07-05 MED ORDER — ZOLPIDEM TARTRATE 5 MG PO TABS: 5.0000 mg | ORAL_TABLET | Freq: Every evening | ORAL | Status: DC | PRN

## 2019-07-05 MED ORDER — THROMBIN 20000 UNITS EX SOLR
CUTANEOUS | Status: AC
  Filled 2019-07-05: qty 20000

## 2019-07-05 MED ORDER — NEOSTIGMINE METHYLSULFATE 10 MG/10ML IV SOLN
INTRAVENOUS | Status: DC | PRN
  Administered 2019-07-05: 3 mg via INTRAVENOUS

## 2019-07-05 MED ORDER — PANTOPRAZOLE SODIUM 40 MG PO TBEC
80.0000 mg | DELAYED_RELEASE_TABLET | Freq: Every day | ORAL | Status: DC
  Administered 2019-07-06: 80 mg via ORAL
  Filled 2019-07-05: qty 2

## 2019-07-05 MED ORDER — MIDAZOLAM HCL 2 MG/2ML IJ SOLN
INTRAMUSCULAR | Status: DC | PRN
  Administered 2019-07-05: 2 mg via INTRAVENOUS

## 2019-07-05 MED ORDER — ONDANSETRON HCL 4 MG PO TABS: 4.0000 mg | ORAL_TABLET | Freq: Four times a day (QID) | ORAL | Status: DC | PRN

## 2019-07-05 MED ORDER — SODIUM CHLORIDE 0.9% FLUSH
3.0000 mL | Freq: Two times a day (BID) | INTRAVENOUS | Status: DC
  Administered 2019-07-05: 3 mL via INTRAVENOUS

## 2019-07-05 MED ORDER — POTASSIUM CHLORIDE IN NACL 20-0.9 MEQ/L-% IV SOLN
INTRAVENOUS | Status: DC
  Filled 2019-07-05: qty 1000

## 2019-07-05 SURGICAL SUPPLY — 66 items
ADH SKN CLS APL DERMABOND .7 (GAUZE/BANDAGES/DRESSINGS) ×1
APL SKNCLS STERI-STRIP NONHPOA (GAUZE/BANDAGES/DRESSINGS)
BASKET BONE COLLECTION (BASKET) ×1 IMPLANT
BENZOIN TINCTURE PRP APPL 2/3 (GAUZE/BANDAGES/DRESSINGS) IMPLANT
BLADE CLIPPER SURG (BLADE) ×1 IMPLANT
BLOCKER XIA CT (Orthopedic Implant) ×2 IMPLANT
BUR MATCHSTICK NEURO 3.0 LAGG (BURR) ×2 IMPLANT
BUR PRECISION FLUTE 5.0 (BURR) ×2 IMPLANT
BUR ROUND FLUTED 5 RND (BURR) ×2 IMPLANT
CAGE POST IBF 11X8D 26/9 (Cage) ×1 IMPLANT
CANISTER SUCT 3000ML PPV (MISCELLANEOUS) ×2 IMPLANT
CARTRIDGE OIL MAESTRO DRILL (MISCELLANEOUS) ×1 IMPLANT
CNTNR URN SCR LID CUP LEK RST (MISCELLANEOUS) ×1 IMPLANT
CONT SPEC 4OZ STRL OR WHT (MISCELLANEOUS) ×2
COVER BACK TABLE 60X90IN (DRAPES) ×2 IMPLANT
COVER WAND RF STERILE (DRAPES) ×1 IMPLANT
DECANTER SPIKE VIAL GLASS SM (MISCELLANEOUS) ×2 IMPLANT
DERMABOND ADVANCED (GAUZE/BANDAGES/DRESSINGS) ×1
DERMABOND ADVANCED .7 DNX12 (GAUZE/BANDAGES/DRESSINGS) ×1 IMPLANT
DIFFUSER DRILL AIR PNEUMATIC (MISCELLANEOUS) ×2 IMPLANT
DRAPE C-ARM 42X72 X-RAY (DRAPES) ×4 IMPLANT
DRAPE C-ARMOR (DRAPES) ×1 IMPLANT
DRAPE LAPAROTOMY 100X72X124 (DRAPES) ×2 IMPLANT
DRAPE SHEET LG 3/4 BI-LAMINATE (DRAPES) ×1 IMPLANT
DRAPE SURG 17X23 STRL (DRAPES) ×2 IMPLANT
DRSG OPSITE POSTOP 4X6 (GAUZE/BANDAGES/DRESSINGS) ×1 IMPLANT
DURAPREP 26ML APPLICATOR (WOUND CARE) ×2 IMPLANT
ELECT REM PT RETURN 9FT ADLT (ELECTROSURGICAL) ×2
ELECTRODE REM PT RTRN 9FT ADLT (ELECTROSURGICAL) ×1 IMPLANT
GAUZE 4X4 16PLY RFD (DISPOSABLE) IMPLANT
GAUZE SPONGE 4X4 12PLY STRL (GAUZE/BANDAGES/DRESSINGS) IMPLANT
GLOVE ECLIPSE 6.5 STRL STRAW (GLOVE) ×4 IMPLANT
GLOVE EXAM NITRILE XL STR (GLOVE) IMPLANT
GOWN STRL REUS W/ TWL LRG LVL3 (GOWN DISPOSABLE) ×2 IMPLANT
GOWN STRL REUS W/ TWL XL LVL3 (GOWN DISPOSABLE) IMPLANT
GOWN STRL REUS W/TWL 2XL LVL3 (GOWN DISPOSABLE) IMPLANT
GOWN STRL REUS W/TWL LRG LVL3 (GOWN DISPOSABLE) ×4
GOWN STRL REUS W/TWL XL LVL3 (GOWN DISPOSABLE)
KIT BASIN OR (CUSTOM PROCEDURE TRAY) ×2 IMPLANT
KIT POSITION SURG JACKSON T1 (MISCELLANEOUS) ×2 IMPLANT
KIT TURNOVER KIT B (KITS) ×2 IMPLANT
MARKER TAP CANN 5.5X30 (TAP) ×1 IMPLANT
MILL MEDIUM DISP (BLADE) IMPLANT
NDL HYPO 25X1 1.5 SAFETY (NEEDLE) ×1 IMPLANT
NDL SPNL 18GX3.5 QUINCKE PK (NEEDLE) IMPLANT
NEEDLE HYPO 25X1 1.5 SAFETY (NEEDLE) ×2 IMPLANT
NEEDLE SPNL 18GX3.5 QUINCKE PK (NEEDLE) ×2 IMPLANT
NS IRRIG 1000ML POUR BTL (IV SOLUTION) ×2 IMPLANT
OIL CARTRIDGE MAESTRO DRILL (MISCELLANEOUS) ×2
PACK LAMINECTOMY NEURO (CUSTOM PROCEDURE TRAY) ×2 IMPLANT
PAD ARMBOARD 7.5X6 YLW CONV (MISCELLANEOUS) ×4 IMPLANT
ROD SPINAL XIA 4.5X30 (Screw) ×1 IMPLANT
SCREW CANC PA THRD 6.5X35 60D (Screw) ×1 IMPLANT
SCREW PA XIA CT 5.5X30 (Screw) ×1 IMPLANT
SPONGE LAP 4X18 RFD (DISPOSABLE) IMPLANT
SPONGE SURGIFOAM ABS GEL 100 (HEMOSTASIS) ×2 IMPLANT
STRIP CLOSURE SKIN 1/2X4 (GAUZE/BANDAGES/DRESSINGS) IMPLANT
SUT PROLENE 6 0 BV (SUTURE) IMPLANT
SUT VIC AB 0 CT1 18XCR BRD8 (SUTURE) ×1 IMPLANT
SUT VIC AB 0 CT1 8-18 (SUTURE) ×2
SUT VIC AB 2-0 CT1 18 (SUTURE) ×3 IMPLANT
SUT VIC AB 3-0 SH 8-18 (SUTURE) ×2 IMPLANT
TOWEL GREEN STERILE (TOWEL DISPOSABLE) ×2 IMPLANT
TOWEL GREEN STERILE FF (TOWEL DISPOSABLE) ×2 IMPLANT
TRAY FOLEY MTR SLVR 16FR STAT (SET/KITS/TRAYS/PACK) ×2 IMPLANT
WATER STERILE IRR 1000ML POUR (IV SOLUTION) ×2 IMPLANT

## 2019-07-05 NOTE — Op Note (Signed)
07/05/2019  1:35 PM  PATIENT:  Evan [name]  64 y.o. male  PRE-OPERATIVE DIAGNOSIS:  Osteoarthritis of spine with radiculopathy, Lumbar region. Right lateral recess stenosis L5/S1  POST-OPERATIVE DIAGNOSIS:  Osteoarthritis of spine with radiculopathy, Lumbar region. Right lateral recess stenosis right side L5/S1  PROCEDURE:  Procedure(s): Right Lumbar Five -Sacral One Posterior lumbar interbody fusion synthes conduit 20mm cage. Autograft morsels in the disc space and cage Right L5 hemilaminectomy with inferior facetectomy Non segmental pedicle screw fixation right L5/S1(stryker xia)  SURGEON:  Surgeon(s): Ashok Pall, MD  ASSISTANTS:Ostergard, Marcello Moores  ANESTHESIA:   general  EBL:  Total I/O In: 1500 [I.V.:1400; IV Piggyback:100] Out: 190 [Urine:150; Blood:40]  BLOOD ADMINISTERED:none  CELL SAVER GIVEN:none  COUNT:per nursing  DRAINS: none   SPECIMEN:  No Specimen  DICTATION: [name] is a 64 y.o. male whom was taken to the operating room intubated, and placed under a general anesthetic without difficulty. A foley catheter was placed under sterile conditions. He was positioned prone on a Jackson stable with all pressure points properly padded.  His lumbar region was prepped and draped in a sterile manner. I infiltrated 7cc's 1/2%lidocaine/1:2000,000 strength epinephrine into the planned incision. I opened the skin with a 10 blade and took the incision down to the thoracolumbar fascia. I exposed the lamina of L5 and S1 in a subperiosteal fashion bilaterally. I confirmed my location with an intraoperative xray.  I placed self retaining retractors and started the decompression.  I decompressed the spinal canal on the right side via a near complete laminectomy of L5 on the right side. I also performed inferior facetectomy at L5 on the right. I then with the Kerrison punches and the drilled exposed the disc space and unroofed the L5 root from the canal to the extracanal  space.  A PLIF was performed at L5/s1 in the following fashion. I opened the disc space with a 15 blade then used a variety of instruments to remove the disc and prepare the space for the arthrodesis. I used curettes, rongeurs, punches, shavers for the disc space, and rasps in the discetomy. I measured the disc space and placed 54mm Conduit titanium cage([employer]) into the disc space(s).   I placed pedicle screws at L5 and S1 on the right side, using fluoroscopic guidance. I drilled a pilot hole, then cannulated the pedicle with a drill at each site. I then tapped each pedicle, assessing each site for pedicle violations. No cutouts were appreciated. Screws (Stryker xia) were then placed at each site without difficulty. We attached the rod and locking caps with the appropriate tools. The locking caps were secured with torque limited screwdrivers. Final films were performed and the final construct appeared to be in good position.  We closed the wound in a layered fashion. We approximated the thoracolumbar fascia, subcutaneous, and subcuticular planes with vicryl sutures. I used dermabond and an occlusive bandage for a sterile dressing.     PLAN OF CARE: Admit to inpatient   PATIENT DISPOSITION:  PACU - hemodynamically stable.   Delay start of Pharmacological VTE agent (>24hrs) due to surgical blood loss or risk of bleeding:  yes

## 2019-07-05 NOTE — Anesthesia Procedure Notes (Signed)
Procedure Name: Intubation Date/Time: 07/05/2019 9:55 AM Performed by: Verdie Drown, CRNA Pre-anesthesia Checklist: Patient identified, Emergency Drugs available, Suction available and Patient being monitored Patient Re-evaluated:Patient Re-evaluated prior to induction Oxygen Delivery Method: Circle System Utilized Preoxygenation: Pre-oxygenation with 100% oxygen Induction Type: IV induction Ventilation: Mask ventilation without difficulty and Oral airway inserted - appropriate to patient size Laryngoscope Size: Mac and 4 Grade View: Grade I Tube type: Oral Tube size: 7.5 mm Number of attempts: 1 Airway Equipment and Method: Stylet and Oral airway Placement Confirmation: ETT inserted through vocal cords under direct vision,  positive ETCO2 and breath sounds checked- equal and bilateral Secured at: 22 cm Tube secured with: Tape Dental Injury: Teeth and Oropharynx as per pre-operative assessment

## 2019-07-05 NOTE — Progress Notes (Signed)
Orthopedic Tech Progress Note Patient Details:  Evan [name] 02-01-1956 [id number] Called In order to [facility] for an ASPEN LUMBAR BRACE atient ID: Evan [name], male   DOB: 11-08-1955, 64 y.o.   MRN: [id number]   Evan [name] 07/05/2019, 3:04 PM

## 2019-07-05 NOTE — Transfer of Care (Signed)
Immediate Anesthesia Transfer of Care Note  Patient: Evan [name]  Procedure(s) Performed: Right Lumbar Five -Sacral One Posterior lumbar interbody fusion (Right )  Patient Location: PACU  Anesthesia Type:General  Level of Consciousness: patient cooperative and responds to stimulation  Airway & Oxygen Therapy: Patient Spontanous Breathing and Patient connected to nasal cannula oxygen  Post-op Assessment: Report given to RN and Post -op Vital signs reviewed and stable  Post vital signs: Reviewed and stable  Last Vitals:  Vitals Value Taken Time  BP 148/130 07/05/19 1315  Temp    Pulse 65 07/05/19 1327  Resp 16 07/05/19 1327  SpO2 96 % 07/05/19 1327  Vitals shown include unvalidated device data.  Last Pain:  Vitals:   07/05/19 0740  TempSrc:   PainSc: 3          Complications: No apparent anesthesia complications

## 2019-07-05 NOTE — Anesthesia Postprocedure Evaluation (Signed)
Anesthesia Post Note  Patient: Evan [name]  Procedure(s) Performed: Right Lumbar Five -Sacral One Posterior lumbar interbody fusion (Right )     Patient location during evaluation: PACU Anesthesia Type: General Level of consciousness: awake and alert Pain management: pain level controlled Vital Signs Assessment: post-procedure vital signs reviewed and stable Respiratory status: spontaneous breathing, nonlabored ventilation, respiratory function stable and patient connected to nasal cannula oxygen Cardiovascular status: blood pressure returned to baseline and stable Postop Assessment: no apparent nausea or vomiting Anesthetic complications: no    Last Vitals:  Vitals:   07/05/19 1400 07/05/19 1415  BP: 112/71 122/79  Pulse: (!) 58 72  Resp: 12 13  Temp:    SpO2: 99% 98%    Last Pain:  Vitals:   07/05/19 1415  TempSrc:   PainSc: 4                  Tysin Salada S
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
## 2019-07-06 MED ORDER — TIZANIDINE HCL 4 MG PO TABS: 4.0000 mg | ORAL_TABLET | Freq: Four times a day (QID) | ORAL | 0 refills | Status: DC | PRN

## 2019-07-06 MED ORDER — HYDROCODONE-ACETAMINOPHEN 7.5-325 MG PO TABS: 1.0000 | ORAL_TABLET | Freq: Four times a day (QID) | ORAL | 0 refills | Status: AC

## 2019-07-06 NOTE — Progress Notes (Signed)
Patient is discharged from room 3C04 at this time. Alert and in stable condition. IV site d/c'd and instructions read to patient and spouse with understanding verbalized and all questions answered. Left unit via wheelchair with all belongings at side. 

## 2019-07-06 NOTE — Discharge Instructions (Signed)
Wound Care Leave incision open to air. You may shower. Do not scrub directly on incision.  Do not put any creams, lotions, or ointments on incision. Activity Walk each and every day, increasing distance each day. No lifting greater than 5 lbs.  Avoid bending, arching, and twisting. No driving for 2 weeks; may ride as a passenger locally. If provided with back brace, wear when out of bed.  It is not necessary to wear in bed. Diet Resume your normal diet.  Return to Work  [facility] [place] After Refer to this sheet in the next few weeks. These instructions provide you with information on caring for yourself after your procedure. Your caregiver may also give you more specific instructions. Your treatment has been planned according to current medical practices, but problems sometimes occur. Call your caregiver if you have any problems or questions after your procedure. HOME CARE INSTRUCTIONS   Take whatever pain medicine has been prescribed by your caregiver. Do not take over-the-counter pain medicine unless directed otherwise by your caregiver.   Do not drive if you are taking narcotic pain medicines.   Change your bandage (dressing) if necessary or as directed by your caregiver.   You may shower. The wound may get wet, simply pat the area dry. It will take ~2 weeks for the glue to peel off.  If you have been prescribed medicine to prevent your blood from clotting, follow the directions carefully.   Check the area around your incision often. Look for redness and swelling. Also, look for anything leaking from your wound. You can use a mirror or have a family member inspect your incision if it is in a place where it is difficult for you to see.   Ask your caregiver what activities you should avoid and for how long.   Walk as much as possible.   Do not lift anything heavier than 5 lbs until your caregiver says it is safe.   Do not twist or bend for a few weeks. Try not to pull on  things. Avoid sitting for long periods of time. Change positions at least every hour.   Will be discussed at you follow up appointment. Call Your Doctor If Any of These Occur Redness, drainage, or swelling at the wound.  Temperature greater than 101 degrees. Severe pain not relieved by pain medication. Incision starts to come apart. Follow Up Appt Call today for appointment in 2-4 weeks [phone]) or for problems.  If you have any hardware placed in your spine, you will need an x-Raijon before your appointment.
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
## 2019-07-06 NOTE — Discharge Summary (Signed)
Physician Discharge Summary  Patient ID: Evan [name] MRN: [id number] DOB/AGE: Jun 25, 1955 64 y.o.  Admit date: 07/05/2019 Discharge date: 07/06/2019  Admission Diagnoses:lumbar stenosis with neurogenic claudication, lumbar radiculopathy  Discharge Diagnoses:  Active Problems:   Lumbar stenosis with neurogenic claudication   Discharged Condition: good  Hospital Course: Evan [name] was admitted and taken to the operating room for an uncomplicated right sided lumbar decompression at L5/S1. Post op he is ambulating, voiding, and tolerating a regular diet His wound is clean, dry, without signs of infection.   Treatments: surgery: Right L5/S1 plif with Synthes conduit cage, Xia pedicle screw fixation  Discharge Exam: Blood pressure (!) 137/91, pulse 70, temperature 98.4 F (36.9 C), temperature source Oral, resp. rate 18, height 5\' 8"  (1.727 m), weight 113.4 kg, SpO2 97 %. General appearance: alert, cooperative and no distress Neurologic: Alert and oriented X 3, normal strength and tone. Normal symmetric reflexes. Normal coordination and gait  Disposition: Discharge disposition: 01-Home or Self Care      Osteoarthritis of spine with radiculopathy, Lumbar region  Allergies as of 07/06/2019      Reactions   Buprenorphine Hcl Nausea And Vomiting   Morphine And Related Nausea And Vomiting      Medication List    TAKE these medications   albuterol 108 (90 Base) MCG/ACT inhaler Commonly known as: VENTOLIN HFA Inhale 1-2 puffs into the lungs every 6 (six) hours as needed for wheezing or shortness of breath.   ALPRAZolam 0.5 MG tablet Commonly known as: XANAX Take 0.5 mg by mouth at bedtime as needed for anxiety.   amLODipine 10 MG tablet Commonly known as: NORVASC Take 10 mg by mouth daily.   aspirin EC 81 MG tablet Take 81 mg by mouth daily.   FLUoxetine 10 MG capsule Commonly known as: PROZAC Take 20 mg by mouth at bedtime.   gabapentin 300 MG capsule Commonly  known as: NEURONTIN Take 2 capsules (600 mg total) by mouth 2 (two) times daily.   HYDROcodone-acetaminophen 7.5-325 MG tablet Commonly known as: NORCO Take 1 tablet by mouth every 6 (six) hours as needed for moderate pain or severe pain. What changed: Another medication with the same name was added. Make sure you understand how and when to take each.   HYDROcodone-acetaminophen 7.5-325 MG tablet Commonly known as: NORCO Take 1 tablet by mouth every 6 (six) hours for 8 days. What changed: You were already taking a medication with the same name, and this prescription was added. Make sure you understand how and when to take each.   metoprolol tartrate 100 MG tablet Commonly known as: LOPRESSOR Take one tablet by mouth 2 hours prior to your CT   omeprazole 40 MG capsule Commonly known as: PRILOSEC Take 40 mg by mouth daily.   rosuvastatin 20 MG tablet Commonly known as: CRESTOR Take 1 tablet (20 mg total) by mouth daily.   tamsulosin 0.4 MG Caps capsule Commonly known as: FLOMAX Take 0.4 mg by mouth daily.   tiZANidine 4 MG tablet Commonly known as: ZANAFLEX Take 1 tablet (4 mg total) by mouth every 6 (six) hours as needed for muscle spasms.      Follow-up Information    Ashok Pall, MD Follow up in 3 week(s).   Specialty: Neurosurgery Why: please call to make an appointment Contact information: [address] [place] [address] [phone]           Signed: Ashok Pall 07/06/2019, 3:59 PM

## 2019-10-03 ENCOUNTER — Emergency Department (HOSPITAL_COMMUNITY): Payer: Medicare HMO

## 2019-10-03 ENCOUNTER — Encounter (HOSPITAL_COMMUNITY): Payer: Self-pay

## 2019-10-03 ENCOUNTER — Emergency Department (HOSPITAL_COMMUNITY)
Admission: EM | Admit: 2019-10-03 | Discharge: 2019-10-03 | Disposition: A | Discharge status: Home / Self Care | Payer: Medicare HMO | Attending: Emergency Medicine | Admitting: Emergency Medicine

## 2019-10-03 ENCOUNTER — Other Ambulatory Visit: Payer: Self-pay

## 2019-10-03 DIAGNOSIS — Z86718 Personal history of other venous thrombosis and embolism: Secondary | ICD-10-CM | POA: Diagnosis not present

## 2019-10-03 DIAGNOSIS — Z885 Allergy status to narcotic agent status: Secondary | ICD-10-CM | POA: Insufficient documentation

## 2019-10-03 DIAGNOSIS — F419 Anxiety disorder, unspecified: Secondary | ICD-10-CM | POA: Insufficient documentation

## 2019-10-03 DIAGNOSIS — Z886 Allergy status to analgesic agent status: Secondary | ICD-10-CM | POA: Diagnosis not present

## 2019-10-03 DIAGNOSIS — N189 Chronic kidney disease, unspecified: Secondary | ICD-10-CM | POA: Diagnosis not present

## 2019-10-03 DIAGNOSIS — I129 Hypertensive chronic kidney disease with stage 1 through stage 4 chronic kidney disease, or unspecified chronic kidney disease: Secondary | ICD-10-CM | POA: Insufficient documentation

## 2019-10-03 DIAGNOSIS — Z87891 Personal history of nicotine dependence: Secondary | ICD-10-CM | POA: Diagnosis not present

## 2019-10-03 DIAGNOSIS — J449 Chronic obstructive pulmonary disease, unspecified: Secondary | ICD-10-CM | POA: Insufficient documentation

## 2019-10-03 DIAGNOSIS — R0602 Shortness of breath: Secondary | ICD-10-CM | POA: Diagnosis present

## 2019-10-03 DIAGNOSIS — Z79899 Other long term (current) drug therapy: Secondary | ICD-10-CM | POA: Insufficient documentation

## 2019-10-03 DIAGNOSIS — Z7982 Long term (current) use of aspirin: Secondary | ICD-10-CM | POA: Diagnosis not present

## 2019-10-03 LAB — CBC WITH DIFFERENTIAL/PLATELET
Abs Immature Granulocytes: 0.05 10*3/uL (ref 0.00–0.07)
Basophils Absolute: 0.1 10*3/uL (ref 0.0–0.1)
Basophils Relative: 1 %
Eosinophils Absolute: 0.2 10*3/uL (ref 0.0–0.5)
Eosinophils Relative: 2 %
HCT: 44.3 % (ref 39.0–52.0)
Hemoglobin: 13.4 g/dL (ref 13.0–17.0)
Immature Granulocytes: 1 %
Lymphocytes Relative: 11 %
Lymphs Abs: 1 10*3/uL (ref 0.7–4.0)
MCH: 26.3 pg (ref 26.0–34.0)
MCHC: 30.2 g/dL (ref 30.0–36.0)
MCV: 86.9 fL (ref 80.0–100.0)
Monocytes Absolute: 0.5 10*3/uL (ref 0.1–1.0)
Monocytes Relative: 5 %
Neutro Abs: 7.5 10*3/uL (ref 1.7–7.7)
Neutrophils Relative %: 80 %
Platelets: 211 10*3/uL (ref 150–400)
RBC: 5.1 MIL/uL (ref 4.22–5.81)
RDW: 13.8 % (ref 11.5–15.5)
WBC: 9.3 10*3/uL (ref 4.0–10.5)
nRBC: 0 % (ref 0.0–0.2)

## 2019-10-03 LAB — COMPREHENSIVE METABOLIC PANEL
ALT: 33 U/L (ref 0–44)
AST: 37 U/L (ref 15–41)
Albumin: 4.1 g/dL (ref 3.5–5.0)
Alkaline Phosphatase: 73 U/L (ref 38–126)
Anion gap: 8 (ref 5–15)
BUN: 19 mg/dL (ref 8–23)
CO2: 26 mmol/L (ref 22–32)
Calcium: 9 mg/dL (ref 8.9–10.3)
Chloride: 105 mmol/L (ref 98–111)
Creatinine, Ser: 1.05 mg/dL (ref 0.61–1.24)
GFR calc Af Amer: >60 mL/min (ref >60)
GFR calc non Af Amer: >60 mL/min (ref >60)
Glucose, Bld: 139 mg/dL — ABNORMAL HIGH (ref 70–99)
Potassium: 3.9 mmol/L (ref 3.5–5.1)
Sodium: 139 mmol/L (ref 135–145)
Total Bilirubin: 0.4 mg/dL (ref 0.3–1.2)
Total Protein: 7.6 g/dL (ref 6.5–8.1)

## 2019-10-03 LAB — BRAIN NATRIURETIC PEPTIDE: B Natriuretic Peptide: 25.1 pg/mL (ref 0.0–100.0)

## 2019-10-03 MED ORDER — IOHEXOL 350 MG/ML SOLN
100.0000 mL | Freq: Once | INTRAVENOUS | Status: AC | PRN
  Administered 2019-10-03: 100 mL via INTRAVENOUS

## 2019-10-03 NOTE — ED Triage Notes (Signed)
Pt arrives [facility] from home with complaints of SHOB. Pt has hx of COPD. Pt reports sudden onset after getting out of the shower. Per EMS: Pt was hyperventilating upon arrival. EMS coached breathing and breathing returned to baseline. Pt has significant hx of anxiety. RA sat 93% upon EMS arrival. Pt reports that breathing has returned to baseline.

## 2019-10-03 NOTE — Discharge Instructions (Signed)
Continue taking home medications as prescribed. Make sure you are eating regular meals. It is very important that you follow-up with your primary care doctor for refill of your COPD medications and further discussion about managing your anxiety. Return to the emergency room with any new, worsening, concerning symptoms.

## 2019-10-03 NOTE — ED Provider Notes (Signed)
[facility] DEPT Provider Note   CSN: [id number] Arrival date & time: 10/03/19  1333     History Chief Complaint  Patient presents with  . Shortness of Breath    Evan [name] is a 64 y.o. male presenting for evaluation of shortness of breath.  Patient states he was in the shower when he started to feel lightheaded and weak.  When he got another shower, he became more more short of breath.  This caused him to feel very anxious, and he feels like he had a panic attack.  Since then, he continues to have shortness of breath with exertion or talking, although none at rest.  He continues to have mild lightheadedness.  He denies feeling as if the room is spinning or feeling off balance.  He states he has not yet had anything to eat today, this is atypical for him.  He denies recent fall, trauma, or injury.  He has a history of COPD, but has been out of his inhalers for several months.  He does not wear oxygen at home.  He was given oxygen with EMS, reports improvement of shortness of breath with this.  He was not given any breathing treatments.  He denies recent fevers, chills, chest pain, cough, nausea, vomiting, abdominal pain, urinary symptoms, normal bowel movements.  Patient had back surgery in March 2021, since then has had increased difficulty moving around.  He has a history of left-sided weakness due to a previous neck injury.  He reports a history of anxiety, used to take Xanax daily for anxiety, is no longer on this.  Additional history obtained from chart review.  Patient with a history of previous DVT.  He states this was 2 years ago, was on blood thinners for short time, but is no longer on blood thinners.  He takes only on aspirin daily.  Additional history of depression, seizures, anxiety, arthritis, COPD, GERD, hypertension, CKD.  HPI     Past Medical History:  Diagnosis Date  . Anxiety   . Arthritis   . Chronic kidney disease    kidney stones    . COPD (chronic obstructive pulmonary disease) ([place])   . Depression   . DVT (deep venous thrombosis) ([place])   . GERD (gastroesophageal reflux disease)   . History of kidney stones   . Hypertension   . Neck pain   . 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 Right-sided low back pain with left-sided sciatica   . Seizures ([place])    no seizures in many years  . Shortness of breath dyspnea    with  exertion  . Sleep apnea    last sleep study more than 5 years  uses CPAP even when napping    Patient Active Problem List   Diagnosis Date Noted  . Lumbar stenosis with neurogenic claudication 07/05/2019  . Morbid obesity due to excess calories ([place]) 02/23/2018  . Pulmonary nodule 02/23/2018  . COPD GOLD 0  01/03/2018  . Acute deep vein thrombosis (DVT) of right popliteal vein ([place]) 12/17/2017  . AKI (acute kidney injury) ([place]) 12/17/2017  . Community acquired pneumonia of right upper lobe of lung 12/17/2017  . HTN (hypertension) 12/17/2017  . Osteoarthritis of right hip 07/09/2015  . 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 Right-sided low back pain with left-sided sciatica   . Low back pain 09/20/2013  . Neck pain 09/20/2013  .  Alteration consciousness 09/20/2013    Past Surgical History:  Procedure Laterality Date  . kidney stones    . Left leg    . NECK SURGERY     4-5--6 and 7  . TOTAL HIP ARTHROPLASTY Right 07/09/2015   Procedure: RIGHT TOTAL HIP ARTHROPLASTY ANTERIOR APPROACH;  Surgeon: Mcarthur Rossetti, MD;  Location: [facility];  Service: Orthopedics;  Laterality: Right;       Family History  Problem Relation Age of Onset  . Hepatitis Mother   . Alzheimer's disease Father     Social History   Tobacco Use  . Smoking status: Former Smoker    Packs/day: 2.00    Years: 45.00    Pack years: 90.00    Types: Cigarettes    Quit date: 12/15/2017    Years since quitting: 1.8  . Smokeless tobacco:  Never Used  Substance Use Topics  . Alcohol use: Yes    Alcohol/week: 1.0 - 2.0 standard drinks    Types: 1 - 2 Shots of liquor per week    Comment: daily   . Drug use: No    Home Medications Prior to Admission medications   Medication Sig Start Date End Date Taking? Authorizing Provider  albuterol (VENTOLIN HFA) 108 (90 Base) MCG/ACT inhaler Inhale 1-2 puffs into the lungs every 6 (six) hours as needed for wheezing or shortness of breath. 10/03/18  Yes Pattricia Boss, MD  ALPRAZolam Duanne Moron) 0.5 MG tablet Take 0.5 mg by mouth at bedtime as needed for anxiety.    Yes [provider]  amLODipine (NORVASC) 10 MG tablet Take 10 mg by mouth daily.   Yes [provider]  aspirin EC 81 MG tablet Take 81 mg by mouth daily.   Yes [provider]  ergocalciferol (VITAMIN D2) 1.25 MG (50000 UT) capsule Take 50,000 Units by mouth once a week. Monday   Yes [provider]  fexofenadine (ALLEGRA) 180 MG tablet Take 180 mg by mouth daily as needed for allergies.   Yes [provider]  FLUoxetine (PROZAC) 20 MG capsule Take 20 mg by mouth daily. 08/31/19  Yes [provider]  gabapentin (NEURONTIN) 300 MG capsule Take 2 capsules (600 mg total) by mouth 2 (two) times daily. 10/23/15  Yes Marcial Pacas, MD  HYDROcodone-acetaminophen (NORCO) 7.5-325 MG tablet Take 1 tablet by mouth every 6 (six) hours as needed for moderate pain or severe pain.    Yes [provider]  omeprazole (PRILOSEC) 40 MG capsule Take 40 mg by mouth daily.  11/15/17  Yes [provider]  rosuvastatin (CRESTOR) 20 MG tablet Take 1 tablet (20 mg total) by mouth daily. 11/17/18 10/03/19 Yes Belva Crome, MD  tamsulosin (FLOMAX) 0.4 MG CAPS capsule Take 0.4 mg by mouth daily. 09/26/18  Yes [provider]  tiZANidine (ZANAFLEX) 4 MG tablet Take 1 tablet (4 mg total) by mouth every 6 (six) hours as needed for muscle spasms. 07/06/19  Yes Ashok Pall, MD    Allergies      Buprenorphine hcl and Morphine and related  Review of Systems   Review of Systems  Respiratory: Positive for shortness of breath.   Neurological: Positive for light-headedness.  Psychiatric/Behavioral: The patient is nervous/anxious.   All other systems reviewed and are negative.   Physical Exam Updated Vital Signs BP 120/72   Pulse 64   Temp 97.9 F (36.6 C) (Oral)   Resp 15   SpO2 91%   Physical Exam Vitals and nursing note reviewed.  Constitutional:      General: He is not in acute distress.    Appearance: He is well-developed.     Comments: Appears nontoxic  HENT:     Head: Normocephalic and atraumatic.  Eyes:     Conjunctiva/sclera: Conjunctivae normal.     Pupils: Pupils are equal, round, and reactive to light.  Cardiovascular:     Rate and Rhythm: Normal rate and regular rhythm.     Pulses: Normal pulses.  Pulmonary:     Effort: Pulmonary effort is normal. No respiratory distress.     Breath sounds: Normal breath sounds. No wheezing.     Comments: Speaking in full sentences.  Clear lung sounds in all fields.  Sats stable on room air. Abdominal:     General: There is no distension.     Palpations: Abdomen is soft. There is no mass.     Tenderness: There is no abdominal tenderness. There is no guarding or rebound.  Musculoskeletal:        General: Normal range of motion.     Cervical back: Normal range of motion and neck supple.     Comments: No calf pain or unilateral swelling. Very mild pitting edema noted bilaterally. Pedal pulses 2+  Skin:    General: Skin is warm and dry.     Capillary Refill: Capillary refill takes less than 2 seconds.  Neurological:     Mental Status: He is alert and oriented to person, place, and time.     ED Results / Procedures / Treatments   Labs (all labs ordered are listed, but only abnormal results are displayed) Labs Reviewed  COMPREHENSIVE METABOLIC PANEL - Abnormal; Notable for the following components:      Result  Value   Glucose, Bld 139 (*)    All other components within normal limits  CBC WITH DIFFERENTIAL/PLATELET  BRAIN NATRIURETIC PEPTIDE    EKG EKG Interpretation  Date/Time:  Tuesday October 03 2019 14:21:23 EDT Ventricular Rate:  67 PR Interval:    QRS Duration: 113 QT Interval:  416 QTC Calculation: 440 R Axis:   14 Text Interpretation: Sinus rhythm Incomplete right bundle branch block No significant change since last tracing Confirmed by Deno Etienne [phone]) on 10/03/2019 3:19:25 PM   Radiology DG Chest 2 View  Result Date: 10/03/2019 CLINICAL DATA:  Shortness of breath and weakness.  Hypertension. EXAM: CHEST - 2 VIEW COMPARISON:  October 03, 2018 chest radiograph and chest CT FINDINGS: The lungs are clear. Heart size and pulmonary vascularity are normal. No adenopathy. There is degenerative change in the thoracic spine. IMPRESSION: Lungs clear.  Cardiac silhouette within normal limits. Electronically Signed   By: Lowella Grip III M.D.   On: 10/03/2019 14:33   CT Angio Chest PE W and/or Wo Contrast  Result Date: 10/03/2019 CLINICAL DATA:  COPD, sudden onset shortness of breath EXAM: CT ANGIOGRAPHY CHEST WITH CONTRAST TECHNIQUE: Multidetector CT imaging of the chest was performed using the standard protocol during bolus administration of intravenous contrast. Multiplanar CT image reconstructions and MIPs were obtained to evaluate the vascular anatomy. CONTRAST:  164mL OMNIPAQUE IOHEXOL 350 MG/ML SOLN COMPARISON:  10/03/2019 FINDINGS: Cardiovascular: This is a technically adequate evaluation of the pulmonary vasculature. No filling defects or pulmonary emboli. The heart is unremarkable without pericardial effusion. Atherosclerosis of the aortic arch is noted, with no evidence of thoracic aortic aneurysm or dissection. Mediastinum/Nodes: No enlarged mediastinal, hilar, or axillary lymph nodes. Thyroid gland, trachea, and esophagus demonstrate no significant findings. Lungs/Pleura: Mild  background  emphysema. No airspace disease, effusion, or pneumothorax. Central airways are patent. Upper Abdomen: There is moderate left renal atrophy. Bilateral renal cortical cysts are noted. Calcified gallstones without cholecystitis. Musculoskeletal: There are bilateral healed rib fractures. No acute or destructive bony lesions. Reconstructed images demonstrate no additional findings. Review of the MIP images confirms the above findings. IMPRESSION: 1. No evidence of pulmonary embolus. 2. No acute intrathoracic process. 3. Cholelithiasis without cholecystitis. 4. Aortic Atherosclerosis (ICD10-I70.0) and Emphysema (ICD10-J43.9). Electronically Signed   By: Randa Ngo M.D.   On: 10/03/2019 16:41    Procedures Procedures (including critical care time)  Medications Ordered in ED Medications  iohexol (OMNIPAQUE) 350 MG/ML injection 100 mL (100 mLs Intravenous Contrast Given 10/03/19 1615)    ED Course  I have reviewed the triage vital signs and the nursing notes.  Pertinent labs & imaging results that were available during my care of the patient were reviewed by me and considered in my medical decision making (see chart for details).    MDM Rules/Calculators/A&P                      Patient presenting for evaluation of shortness of breath and lightheadedness.  On exam, patient appears nontoxic.  He reports his symptoms improved as soon as he calmed down.  He thinks he felt lightheaded because he has not had anything to eat, and then got short of breath and anxious. However, pt reports he continues to feel short of breath with exertion and talking.  Will give patient something to eat/drink to help with lightheadedness.  Will obtain labs and EKG.  Low suspicion for infection such as pneumonia, however obtain chest x-Davidjames due to shortness of breath.  Patient is at high risk for possible PE, considering his history of DVTs and his decreased mobility.  Will obtain a CTA to rule out PE.  If lab work and imaging is  reassuring, patient can likely be discharged.  On reassessment after p.o. intake, patient reports improvement of lightheadedness.  Shortness of breath is also improved.  Labs interpreted by me, overall reassuring.  No leukocytosis, hgb stable.  BMP normal.  Chest x-Dung viewed interpreted by me, no pneumonia, proximal effusion, cardiomegaly.  CTA pending.  CTA negative for acute findings including PE.  As such, likely anxiety is contributing her symptoms.  Also consider COPD, though patient without wheezing, and sats stable on room air.  I do not believe he needs further hospitalization or treatment for this.  Encourage close follow-up with his primary care doctor for refill of his COPD inhalers as well as management of his anxiety.  At this time, patient appears safe for discharge.  Return precautions given.  Patient states he understands and agrees to plan.  Final Clinical Impression(s) / ED Diagnoses Final diagnoses:  Shortness of breath  Anxiety    Rx / DC Orders ED Discharge Orders    None       Franchot Heidelberg, PA-C 10/03/19 [employer], DO 10/04/19 1612

## 2020-05-18 DIAGNOSIS — G4733 Obstructive sleep apnea (adult) (pediatric): Secondary | ICD-10-CM | POA: Diagnosis not present

## 2020-06-13 DIAGNOSIS — E785 Hyperlipidemia, unspecified: Secondary | ICD-10-CM | POA: Diagnosis not present

## 2020-06-13 DIAGNOSIS — I69354 Hemiplegia and hemiparesis following cerebral infarction affecting left non-dominant side: Secondary | ICD-10-CM | POA: Diagnosis not present

## 2020-06-13 DIAGNOSIS — M1990 Unspecified osteoarthritis, unspecified site: Secondary | ICD-10-CM | POA: Diagnosis not present

## 2020-06-13 DIAGNOSIS — K219 Gastro-esophageal reflux disease without esophagitis: Secondary | ICD-10-CM | POA: Diagnosis not present

## 2020-06-13 DIAGNOSIS — G8929 Other chronic pain: Secondary | ICD-10-CM | POA: Diagnosis not present

## 2020-06-13 DIAGNOSIS — G629 Polyneuropathy, unspecified: Secondary | ICD-10-CM | POA: Diagnosis not present

## 2020-06-13 DIAGNOSIS — K5900 Constipation, unspecified: Secondary | ICD-10-CM | POA: Diagnosis not present

## 2020-06-13 DIAGNOSIS — J449 Chronic obstructive pulmonary disease, unspecified: Secondary | ICD-10-CM | POA: Diagnosis not present

## 2020-06-13 DIAGNOSIS — R69 Illness, unspecified: Secondary | ICD-10-CM | POA: Diagnosis not present

## 2020-06-13 DIAGNOSIS — I10 Essential (primary) hypertension: Secondary | ICD-10-CM | POA: Diagnosis not present

## 2020-06-13 DIAGNOSIS — Z008 Encounter for other general examination: Secondary | ICD-10-CM | POA: Diagnosis not present

## 2020-06-18 DIAGNOSIS — M5459 Other low back pain: Secondary | ICD-10-CM | POA: Diagnosis not present

## 2020-06-18 DIAGNOSIS — M47816 Spondylosis without myelopathy or radiculopathy, lumbar region: Secondary | ICD-10-CM | POA: Diagnosis not present

## 2020-06-18 DIAGNOSIS — G894 Chronic pain syndrome: Secondary | ICD-10-CM | POA: Diagnosis not present

## 2020-06-18 DIAGNOSIS — M539 Dorsopathy, unspecified: Secondary | ICD-10-CM | POA: Diagnosis not present

## 2020-07-16 DIAGNOSIS — G894 Chronic pain syndrome: Secondary | ICD-10-CM | POA: Diagnosis not present

## 2020-07-16 DIAGNOSIS — Z79891 Long term (current) use of opiate analgesic: Secondary | ICD-10-CM | POA: Diagnosis not present

## 2020-07-16 DIAGNOSIS — M47816 Spondylosis without myelopathy or radiculopathy, lumbar region: Secondary | ICD-10-CM | POA: Diagnosis not present

## 2020-07-16 DIAGNOSIS — M539 Dorsopathy, unspecified: Secondary | ICD-10-CM | POA: Diagnosis not present

## 2020-07-16 DIAGNOSIS — Z79899 Other long term (current) drug therapy: Secondary | ICD-10-CM | POA: Diagnosis not present

## 2020-07-16 DIAGNOSIS — M5459 Other low back pain: Secondary | ICD-10-CM | POA: Diagnosis not present

## 2020-07-24 DIAGNOSIS — H524 Presbyopia: Secondary | ICD-10-CM | POA: Diagnosis not present

## 2020-07-24 DIAGNOSIS — Z0100 Encounter for examination of eyes and vision without abnormal findings: Secondary | ICD-10-CM | POA: Diagnosis not present

## 2020-07-31 DIAGNOSIS — E611 Iron deficiency: Secondary | ICD-10-CM | POA: Diagnosis not present

## 2020-07-31 DIAGNOSIS — G8929 Other chronic pain: Secondary | ICD-10-CM | POA: Diagnosis not present

## 2020-07-31 DIAGNOSIS — R69 Illness, unspecified: Secondary | ICD-10-CM | POA: Diagnosis not present

## 2020-07-31 DIAGNOSIS — Z122 Encounter for screening for malignant neoplasm of respiratory organs: Secondary | ICD-10-CM | POA: Diagnosis not present

## 2020-07-31 DIAGNOSIS — R5382 Chronic fatigue, unspecified: Secondary | ICD-10-CM | POA: Diagnosis not present

## 2020-07-31 DIAGNOSIS — N401 Enlarged prostate with lower urinary tract symptoms: Secondary | ICD-10-CM | POA: Diagnosis not present

## 2020-07-31 DIAGNOSIS — K219 Gastro-esophageal reflux disease without esophagitis: Secondary | ICD-10-CM | POA: Diagnosis not present

## 2020-07-31 DIAGNOSIS — E559 Vitamin D deficiency, unspecified: Secondary | ICD-10-CM | POA: Diagnosis not present

## 2020-07-31 DIAGNOSIS — E782 Mixed hyperlipidemia: Secondary | ICD-10-CM | POA: Diagnosis not present

## 2020-07-31 DIAGNOSIS — I10 Essential (primary) hypertension: Secondary | ICD-10-CM | POA: Diagnosis not present

## 2020-08-01 ENCOUNTER — Other Ambulatory Visit (HOSPITAL_COMMUNITY): Payer: Self-pay | Admitting: Urgent Care

## 2020-08-01 ENCOUNTER — Other Ambulatory Visit: Payer: Self-pay | Admitting: Urgent Care

## 2020-08-01 DIAGNOSIS — Z87891 Personal history of nicotine dependence: Secondary | ICD-10-CM

## 2020-08-01 DIAGNOSIS — Z122 Encounter for screening for malignant neoplasm of respiratory organs: Secondary | ICD-10-CM

## 2020-08-13 DIAGNOSIS — G894 Chronic pain syndrome: Secondary | ICD-10-CM | POA: Diagnosis not present

## 2020-08-13 DIAGNOSIS — M47817 Spondylosis without myelopathy or radiculopathy, lumbosacral region: Secondary | ICD-10-CM | POA: Diagnosis not present

## 2020-08-13 DIAGNOSIS — M5136 Other intervertebral disc degeneration, lumbar region: Secondary | ICD-10-CM | POA: Diagnosis not present

## 2020-08-13 DIAGNOSIS — M542 Cervicalgia: Secondary | ICD-10-CM | POA: Diagnosis not present

## 2020-08-15 ENCOUNTER — Ambulatory Visit (HOSPITAL_COMMUNITY)
Admission: RE | Admit: 2020-08-15 | Discharge: 2020-08-15 | Disposition: A | Discharge status: Home / Self Care | Payer: Medicare HMO | Source: Ambulatory Visit | Attending: Urgent Care | Admitting: Urgent Care

## 2020-08-15 ENCOUNTER — Other Ambulatory Visit: Payer: Self-pay

## 2020-08-15 DIAGNOSIS — Z122 Encounter for screening for malignant neoplasm of respiratory organs: Secondary | ICD-10-CM

## 2020-08-15 DIAGNOSIS — Z87891 Personal history of nicotine dependence: Secondary | ICD-10-CM | POA: Diagnosis not present

## 2020-08-19 DIAGNOSIS — G4733 Obstructive sleep apnea (adult) (pediatric): Secondary | ICD-10-CM | POA: Diagnosis not present

## 2020-08-23 DIAGNOSIS — I2510 Atherosclerotic heart disease of native coronary artery without angina pectoris: Secondary | ICD-10-CM | POA: Insufficient documentation

## 2020-08-23 DIAGNOSIS — Z0000 Encounter for general adult medical examination without abnormal findings: Secondary | ICD-10-CM | POA: Diagnosis not present

## 2020-08-23 DIAGNOSIS — Z1211 Encounter for screening for malignant neoplasm of colon: Secondary | ICD-10-CM | POA: Diagnosis not present

## 2020-08-23 DIAGNOSIS — R7303 Prediabetes: Secondary | ICD-10-CM | POA: Diagnosis not present

## 2020-08-23 DIAGNOSIS — J438 Other emphysema: Secondary | ICD-10-CM | POA: Diagnosis not present

## 2020-09-10 DIAGNOSIS — M47817 Spondylosis without myelopathy or radiculopathy, lumbosacral region: Secondary | ICD-10-CM | POA: Diagnosis not present

## 2020-09-10 DIAGNOSIS — G894 Chronic pain syndrome: Secondary | ICD-10-CM | POA: Diagnosis not present

## 2020-09-10 DIAGNOSIS — M5136 Other intervertebral disc degeneration, lumbar region: Secondary | ICD-10-CM | POA: Diagnosis not present

## 2020-09-10 DIAGNOSIS — Z79899 Other long term (current) drug therapy: Secondary | ICD-10-CM | POA: Diagnosis not present

## 2020-09-10 DIAGNOSIS — Z79891 Long term (current) use of opiate analgesic: Secondary | ICD-10-CM | POA: Diagnosis not present

## 2020-09-10 DIAGNOSIS — M25559 Pain in unspecified hip: Secondary | ICD-10-CM | POA: Diagnosis not present

## 2020-09-11 DIAGNOSIS — Z86010 Personal history of colonic polyps: Secondary | ICD-10-CM | POA: Diagnosis not present

## 2020-09-11 DIAGNOSIS — K5900 Constipation, unspecified: Secondary | ICD-10-CM | POA: Diagnosis not present

## 2020-09-18 DIAGNOSIS — G4733 Obstructive sleep apnea (adult) (pediatric): Secondary | ICD-10-CM | POA: Diagnosis not present

## 2020-10-15 DIAGNOSIS — M5136 Other intervertebral disc degeneration, lumbar region: Secondary | ICD-10-CM | POA: Diagnosis not present

## 2020-10-15 DIAGNOSIS — G894 Chronic pain syndrome: Secondary | ICD-10-CM | POA: Diagnosis not present

## 2020-10-15 DIAGNOSIS — M5459 Other low back pain: Secondary | ICD-10-CM | POA: Diagnosis not present

## 2020-10-15 DIAGNOSIS — M47817 Spondylosis without myelopathy or radiculopathy, lumbosacral region: Secondary | ICD-10-CM | POA: Diagnosis not present

## 2020-10-19 DIAGNOSIS — G4733 Obstructive sleep apnea (adult) (pediatric): Secondary | ICD-10-CM | POA: Diagnosis not present

## 2020-11-12 DIAGNOSIS — G894 Chronic pain syndrome: Secondary | ICD-10-CM | POA: Diagnosis not present

## 2020-11-12 DIAGNOSIS — Z79891 Long term (current) use of opiate analgesic: Secondary | ICD-10-CM | POA: Diagnosis not present

## 2020-11-12 DIAGNOSIS — M25559 Pain in unspecified hip: Secondary | ICD-10-CM | POA: Diagnosis not present

## 2020-11-12 DIAGNOSIS — M47817 Spondylosis without myelopathy or radiculopathy, lumbosacral region: Secondary | ICD-10-CM | POA: Diagnosis not present

## 2020-11-12 DIAGNOSIS — M5136 Other intervertebral disc degeneration, lumbar region: Secondary | ICD-10-CM | POA: Diagnosis not present

## 2020-11-12 DIAGNOSIS — Z79899 Other long term (current) drug therapy: Secondary | ICD-10-CM | POA: Diagnosis not present

## 2020-12-12 DIAGNOSIS — M47817 Spondylosis without myelopathy or radiculopathy, lumbosacral region: Secondary | ICD-10-CM | POA: Diagnosis not present

## 2020-12-12 DIAGNOSIS — G894 Chronic pain syndrome: Secondary | ICD-10-CM | POA: Diagnosis not present

## 2020-12-12 DIAGNOSIS — M25559 Pain in unspecified hip: Secondary | ICD-10-CM | POA: Diagnosis not present

## 2020-12-12 DIAGNOSIS — M5136 Other intervertebral disc degeneration, lumbar region: Secondary | ICD-10-CM | POA: Diagnosis not present

## 2020-12-26 DIAGNOSIS — D122 Benign neoplasm of ascending colon: Secondary | ICD-10-CM | POA: Diagnosis not present

## 2020-12-26 DIAGNOSIS — K635 Polyp of colon: Secondary | ICD-10-CM | POA: Diagnosis not present

## 2020-12-26 DIAGNOSIS — K640 First degree hemorrhoids: Secondary | ICD-10-CM | POA: Diagnosis not present

## 2020-12-26 DIAGNOSIS — Z86010 Personal history of colonic polyps: Secondary | ICD-10-CM | POA: Diagnosis not present

## 2020-12-31 DIAGNOSIS — K635 Polyp of colon: Secondary | ICD-10-CM | POA: Diagnosis not present

## 2020-12-31 DIAGNOSIS — D122 Benign neoplasm of ascending colon: Secondary | ICD-10-CM | POA: Diagnosis not present

## 2021-01-23 DIAGNOSIS — G894 Chronic pain syndrome: Secondary | ICD-10-CM | POA: Diagnosis not present

## 2021-01-23 DIAGNOSIS — Z79891 Long term (current) use of opiate analgesic: Secondary | ICD-10-CM | POA: Diagnosis not present

## 2021-01-23 DIAGNOSIS — M47817 Spondylosis without myelopathy or radiculopathy, lumbosacral region: Secondary | ICD-10-CM | POA: Diagnosis not present

## 2021-01-23 DIAGNOSIS — M5459 Other low back pain: Secondary | ICD-10-CM | POA: Diagnosis not present

## 2021-01-23 DIAGNOSIS — M5136 Other intervertebral disc degeneration, lumbar region: Secondary | ICD-10-CM | POA: Diagnosis not present

## 2021-01-23 DIAGNOSIS — Z79899 Other long term (current) drug therapy: Secondary | ICD-10-CM | POA: Diagnosis not present

## 2021-02-20 DIAGNOSIS — G4733 Obstructive sleep apnea (adult) (pediatric): Secondary | ICD-10-CM | POA: Diagnosis not present

## 2021-02-27 DIAGNOSIS — K219 Gastro-esophageal reflux disease without esophagitis: Secondary | ICD-10-CM | POA: Diagnosis not present

## 2021-02-27 DIAGNOSIS — N401 Enlarged prostate with lower urinary tract symptoms: Secondary | ICD-10-CM | POA: Diagnosis not present

## 2021-02-27 DIAGNOSIS — L210 Seborrhea capitis: Secondary | ICD-10-CM | POA: Diagnosis not present

## 2021-02-27 DIAGNOSIS — R69 Illness, unspecified: Secondary | ICD-10-CM | POA: Diagnosis not present

## 2021-02-27 DIAGNOSIS — J438 Other emphysema: Secondary | ICD-10-CM | POA: Diagnosis not present

## 2021-02-27 DIAGNOSIS — G5790 Unspecified mononeuropathy of unspecified lower limb: Secondary | ICD-10-CM | POA: Diagnosis not present

## 2021-02-27 DIAGNOSIS — E782 Mixed hyperlipidemia: Secondary | ICD-10-CM | POA: Diagnosis not present

## 2021-02-27 DIAGNOSIS — F419 Anxiety disorder, unspecified: Secondary | ICD-10-CM | POA: Diagnosis not present

## 2021-02-27 DIAGNOSIS — I10 Essential (primary) hypertension: Secondary | ICD-10-CM | POA: Diagnosis not present

## 2021-02-27 DIAGNOSIS — Z23 Encounter for immunization: Secondary | ICD-10-CM | POA: Diagnosis not present

## 2021-03-06 DIAGNOSIS — M47817 Spondylosis without myelopathy or radiculopathy, lumbosacral region: Secondary | ICD-10-CM | POA: Diagnosis not present

## 2021-03-06 DIAGNOSIS — Z79891 Long term (current) use of opiate analgesic: Secondary | ICD-10-CM | POA: Diagnosis not present

## 2021-03-06 DIAGNOSIS — G894 Chronic pain syndrome: Secondary | ICD-10-CM | POA: Diagnosis not present

## 2021-03-06 DIAGNOSIS — M25559 Pain in unspecified hip: Secondary | ICD-10-CM | POA: Diagnosis not present

## 2021-03-06 DIAGNOSIS — M542 Cervicalgia: Secondary | ICD-10-CM | POA: Diagnosis not present

## 2021-03-06 DIAGNOSIS — Z79899 Other long term (current) drug therapy: Secondary | ICD-10-CM | POA: Diagnosis not present

## 2021-03-23 DIAGNOSIS — G4733 Obstructive sleep apnea (adult) (pediatric): Secondary | ICD-10-CM | POA: Diagnosis not present

## 2021-04-17 DIAGNOSIS — M542 Cervicalgia: Secondary | ICD-10-CM | POA: Diagnosis not present

## 2021-04-17 DIAGNOSIS — M47817 Spondylosis without myelopathy or radiculopathy, lumbosacral region: Secondary | ICD-10-CM | POA: Diagnosis not present

## 2021-04-17 DIAGNOSIS — M5136 Other intervertebral disc degeneration, lumbar region: Secondary | ICD-10-CM | POA: Diagnosis not present

## 2021-04-17 DIAGNOSIS — M25559 Pain in unspecified hip: Secondary | ICD-10-CM | POA: Diagnosis not present

## 2021-04-22 DIAGNOSIS — G4733 Obstructive sleep apnea (adult) (pediatric): Secondary | ICD-10-CM | POA: Diagnosis not present

## 2021-05-28 DIAGNOSIS — M961 Postlaminectomy syndrome, not elsewhere classified: Secondary | ICD-10-CM | POA: Diagnosis not present

## 2021-05-28 DIAGNOSIS — M4727 Other spondylosis with radiculopathy, lumbosacral region: Secondary | ICD-10-CM | POA: Diagnosis not present

## 2021-05-28 DIAGNOSIS — M5136 Other intervertebral disc degeneration, lumbar region: Secondary | ICD-10-CM | POA: Diagnosis not present

## 2021-05-28 DIAGNOSIS — G894 Chronic pain syndrome: Secondary | ICD-10-CM | POA: Diagnosis not present

## 2021-05-30 DIAGNOSIS — J439 Emphysema, unspecified: Secondary | ICD-10-CM | POA: Diagnosis not present

## 2021-05-30 DIAGNOSIS — Z79899 Other long term (current) drug therapy: Secondary | ICD-10-CM | POA: Diagnosis not present

## 2021-07-31 DIAGNOSIS — M542 Cervicalgia: Secondary | ICD-10-CM | POA: Diagnosis not present

## 2021-07-31 DIAGNOSIS — M961 Postlaminectomy syndrome, not elsewhere classified: Secondary | ICD-10-CM | POA: Diagnosis not present

## 2021-07-31 DIAGNOSIS — Z79891 Long term (current) use of opiate analgesic: Secondary | ICD-10-CM | POA: Diagnosis not present

## 2021-07-31 DIAGNOSIS — M545 Low back pain, unspecified: Secondary | ICD-10-CM | POA: Diagnosis not present

## 2021-08-19 DIAGNOSIS — G4733 Obstructive sleep apnea (adult) (pediatric): Secondary | ICD-10-CM | POA: Diagnosis not present

## 2021-08-27 DIAGNOSIS — M545 Low back pain, unspecified: Secondary | ICD-10-CM | POA: Diagnosis not present

## 2021-08-27 DIAGNOSIS — Z79891 Long term (current) use of opiate analgesic: Secondary | ICD-10-CM | POA: Diagnosis not present

## 2021-08-27 DIAGNOSIS — M961 Postlaminectomy syndrome, not elsewhere classified: Secondary | ICD-10-CM | POA: Diagnosis not present

## 2021-08-27 DIAGNOSIS — M542 Cervicalgia: Secondary | ICD-10-CM | POA: Diagnosis not present

## 2021-08-27 DIAGNOSIS — G894 Chronic pain syndrome: Secondary | ICD-10-CM | POA: Diagnosis not present

## 2021-09-18 DIAGNOSIS — G4733 Obstructive sleep apnea (adult) (pediatric): Secondary | ICD-10-CM | POA: Diagnosis not present

## 2021-10-01 DIAGNOSIS — G894 Chronic pain syndrome: Secondary | ICD-10-CM | POA: Diagnosis not present

## 2021-10-01 DIAGNOSIS — M545 Low back pain, unspecified: Secondary | ICD-10-CM | POA: Diagnosis not present

## 2021-10-01 DIAGNOSIS — M542 Cervicalgia: Secondary | ICD-10-CM | POA: Diagnosis not present

## 2021-10-01 DIAGNOSIS — Z79891 Long term (current) use of opiate analgesic: Secondary | ICD-10-CM | POA: Diagnosis not present

## 2021-10-01 DIAGNOSIS — M961 Postlaminectomy syndrome, not elsewhere classified: Secondary | ICD-10-CM | POA: Diagnosis not present

## 2021-10-10 DIAGNOSIS — M47812 Spondylosis without myelopathy or radiculopathy, cervical region: Secondary | ICD-10-CM | POA: Insufficient documentation

## 2021-10-10 DIAGNOSIS — G4730 Sleep apnea, unspecified: Secondary | ICD-10-CM | POA: Diagnosis not present

## 2021-10-10 DIAGNOSIS — I10 Essential (primary) hypertension: Secondary | ICD-10-CM | POA: Diagnosis not present

## 2021-10-10 DIAGNOSIS — I2510 Atherosclerotic heart disease of native coronary artery without angina pectoris: Secondary | ICD-10-CM | POA: Diagnosis not present

## 2021-10-10 DIAGNOSIS — E782 Mixed hyperlipidemia: Secondary | ICD-10-CM | POA: Diagnosis not present

## 2021-10-10 DIAGNOSIS — Z87891 Personal history of nicotine dependence: Secondary | ICD-10-CM | POA: Insufficient documentation

## 2021-10-19 DIAGNOSIS — G4733 Obstructive sleep apnea (adult) (pediatric): Secondary | ICD-10-CM | POA: Diagnosis not present

## 2021-10-27 DIAGNOSIS — M545 Low back pain, unspecified: Secondary | ICD-10-CM | POA: Diagnosis not present

## 2021-10-27 DIAGNOSIS — G894 Chronic pain syndrome: Secondary | ICD-10-CM | POA: Diagnosis not present

## 2021-10-27 DIAGNOSIS — M542 Cervicalgia: Secondary | ICD-10-CM | POA: Diagnosis not present

## 2021-10-27 DIAGNOSIS — M961 Postlaminectomy syndrome, not elsewhere classified: Secondary | ICD-10-CM | POA: Diagnosis not present

## 2021-10-27 DIAGNOSIS — Z79891 Long term (current) use of opiate analgesic: Secondary | ICD-10-CM | POA: Diagnosis not present

## 2021-11-11 NOTE — Progress Notes (Signed)
Cardiology Office Note:    Date:  11/13/2021   ID:  MCKALE HAFFEY, DOB 04-Feb-1956, MRN [id number]  PCP:  Chaney Malling, PA  Cardiologist:  Sinclair Grooms, MD   Referring MD: Bobbye Riggs, FNP   Chief Complaint  Patient presents with   Coronary Artery Disease   Hypertension   Hyperlipidemia    History of Present Illness:    Evan [name] is a 66 y.o. male with a hx of COPD, obstructive sleep apnea on CPAP, DOE, and recent incidentically identified CA calcification on CT scan of chest.  He is referred for cardiology consultation because of progressive dyspnea on exertion. Referring provider is Levin Bacon, NP.   Presenting concern because they saw calcium on his cancer screening chest CT.  No chest pain or symptoms other than he has had in the past.  His major limiting symptom is dyspnea.  He has left hemiparesis and spends nearly 15 hours in the bed daily.  He therefore is very short of breath when he tries to do physical activity.  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   Right-sided low back pain with left-sided sciatica    Seizures ([facility])    no seizures in many years   Shortness of breath dyspnea    with  exertion   Sleep apnea    last sleep study more than 5 years  uses CPAP even when napping    Past Surgical History:  Procedure Laterality Date   kidney stones     Left leg     NECK SURGERY     4-5--6 and 7   TOTAL HIP ARTHROPLASTY Right 07/09/2015   Procedure: RIGHT TOTAL HIP ARTHROPLASTY ANTERIOR APPROACH;  Surgeon: Mcarthur Rossetti, MD;  Location: [facility];  Service: Orthopedics;   Laterality: Right;    Current Medications: Current Meds  Medication Sig   albuterol (VENTOLIN HFA) 108 (90 Base) MCG/ACT inhaler Inhale 1-2 puffs into the lungs every 6 (six) hours as needed for wheezing or shortness of breath.   ALPRAZolam (XANAX) 0.5 MG tablet Take 0.5 mg by mouth at bedtime as needed for anxiety.    amLODipine (NORVASC) 10 MG tablet Take 10 mg by mouth daily.   aspirin EC 81 MG tablet Take 81 mg by mouth daily.   Budesonide-Formoterol Fumarate (SYMBICORT IN) Inhale 10.2 mcg into the lungs daily at 12 noon.   ergocalciferol (VITAMIN D2) 1.25 MG (50000 UT) capsule Take 50,000 Units by mouth once a week. Monday   fexofenadine (ALLEGRA) 180 MG tablet Take 180 mg by mouth daily as needed for allergies.   FLUoxetine (PROZAC) 20 MG capsule Take 20 mg by mouth daily.   gabapentin (NEURONTIN) 600 MG tablet Take 600 mg by mouth 2 (two) times daily.   HYDROcodone-acetaminophen (NORCO) 7.5-325 MG tablet Take 1 tablet by mouth every 6 (six) hours as needed for moderate pain or severe pain.    losartan (COZAAR) 25 MG tablet Take 25 mg by mouth at bedtime.   omeprazole (PRILOSEC) 40  MG capsule Take 40 mg by mouth daily.    tamsulosin (FLOMAX) 0.4 MG CAPS capsule Take 0.4 mg by mouth daily.   tiZANidine (ZANAFLEX) 4 MG tablet Take 1 tablet (4 mg total) by mouth every 6 (six) hours as needed for muscle spasms.   [DISCONTINUED] gabapentin (NEURONTIN) 300 MG capsule Take 2 capsules (600 mg total) by mouth 2 (two) times daily.     Allergies:   Buprenorphine hcl and Morphine and related   Social History   Socioeconomic History   Marital status: Divorced    Spouse name: Not on file   Number of children: 2   Years of education: 12   Highest education level: Not on file  Occupational History   Not on file  Tobacco Use   Smoking status: Former    Packs/day: 2.00    Years: 45.00    Total pack years: 90.00    Types: Cigarettes    Quit date: 12/15/2017    Years since quitting: 3.9    Smokeless tobacco: Never  Vaping Use   Vaping Use: Never used  Substance and Sexual Activity   Alcohol use: Yes    Alcohol/week: 1.0 - 2.0 standard drink of alcohol    Types: 1 - 2 Shots of liquor per week    Comment: daily    Drug use: No   Sexual activity: Not on file  Other Topics Concern   Not on file  Social History Narrative   Patient is not working he is trying to get disability   Patient is single    Education 12 th grade    Right handed.   Caffeine three cups daily.            Social Determinants of Health   Financial Resource Strain: Not on file  Food Insecurity: Not on file  Transportation Needs: Not on file  Physical Activity: Not on file  Stress: Not on file  Social Connections: Not on file     Family History: The patient's family history includes Alzheimer's disease in his father; Hepatitis in his mother.  ROS:   Please see the history of present illness.    Does not have a primary care.  Had a left lower extremity DVT a year ago.  Dyspnea on exertion is getting worse.  All other systems reviewed and are negative.  EKGs/Labs/Other Studies Reviewed:    The following studies were reviewed today:  CORONARY CTA 2020: IMPRESSION: 1. Coronary calcium score of 42. This was 65 percentile for age and sex matched control.   2. Normal coronary origin with right dominance.   3. Minimal non-obstructive CAD. CAD RADS 1. Risk factor modification is recommended   2D Doppler echocardiogram 2019: Study Conclusions   - Left ventricle: The cavity size was normal. There was moderate    concentric hypertrophy. Systolic function was normal. The    estimated ejection fraction was in the range of 55% to 60%.    Although no diagnostic regional wall motion abnormality was    identified, this possibility cannot be completely excluded on the    basis of this study. Doppler parameters are consistent with    abnormal left ventricular relaxation (grade 1 diastolic     dysfunction).  - Left atrium: The atrium was mildly dilated.     EKG:  EKG normal sinus rhythm, overall normal appearance.  When compared to June 2021, no changes noted.  Recent Labs: No results found for requested labs within last 365 days.  Recent Lipid Panel    Component Value Date/Time   CHOL 106 01/19/2019 0934   TRIG 102 01/19/2019 0934   HDL 39 (L) 01/19/2019 0934   CHOLHDL 2.7 01/19/2019 0934   LDLCALC 48 01/19/2019 0934    Physical Exam:    VS:  BP 122/72   Pulse 65   Ht '5\' 8"'$  (1.727 m)   Wt 251 lb 3.2 oz (113.9 kg)   SpO2 95%   BMI 38.19 kg/m     Wt Readings from Last 3 Encounters:  11/13/21 251 lb 3.2 oz (113.9 kg)  07/05/19 250 lb (113.4 kg)  07/03/19 247 lb 14.4 oz (112.4 kg)     GEN: Morbid. No acute distress HEENT: Normal NECK: No JVD. LYMPHATICS: No lymphadenopathy CARDIAC: No murmur. RRR no gallop, or edema. VASCULAR:  Normal Pulses. No bruits. RESPIRATORY:  Clear to auscultation without rales, wheezing or rhonchi  ABDOMEN: Soft, non-tender, non-distended, No pulsatile mass, MUSCULOSKELETAL: No deformity  SKIN: Warm and dry NEUROLOGIC:  Alert and oriented x 3 PSYCHIATRIC:  Normal affect   ASSESSMENT:    1. Dyspnea on exertion   2. Primary hypertension   3. COPD GOLD 0    4. Coronary artery disease due to calcified coronary lesion   5. Diabetes mellitus screening    PLAN:    In order of problems listed above:  We will do a BNP.  If elevated needs repeat echo. Continue current therapy including amlodipine and losartan.  Blood pressure is well controlled. Needs primary care.  We will refer him to primary care near him does with [facility]. Aggressive lipid control.  LDLs drawn today.  No evidence of obstructive disease at the time of CTA done 3 years ago. This needs to be followed by primary care.  Coronary CTA done 3 years ago did not reveal obstructive disease.  No current symptoms of angina.  Overall education and awareness concerning  primary/secondary risk prevention was discussed in detail: LDL less than 70, hemoglobin A1c less than 7, blood pressure target less than 130/80 mmHg, >150 minutes of moderate aerobic activity per week, avoidance of smoking, weight control (via diet and exercise), and continued surveillance/management of/for obstructive sleep apnea.    Medication Adjustments/Labs and Tests Ordered: Current medicines are reviewed at length with the patient today.  Concerns regarding medicines are outlined above.  Orders Placed This Encounter  Procedures   Pro b natriuretic peptide (BNP)   Hemoglobin A1c   Lipid panel   Comprehensive metabolic panel   Ambulatory referral to [facility]   EKG 12-Lead   No orders of the defined types were placed in this encounter.   Patient Instructions  Medication Instructions:  Your physician recommends that you continue on your current medications as directed. Please refer to the Current Medication list given to you today.  *If you need a refill on your cardiac medications before your next appointment, please call your pharmacy*  Lab Work: TODAY: BNP, CMET, Lipid panel, Hgb A1c If you have labs (blood work) drawn today and your tests are completely normal, you will receive your results only by: MyChart Message (if you have MyChart) OR A paper copy in the mail If you have any lab test that is abnormal or we need to change your treatment, we will call you to review the results.  Testing/Procedures: NONE  Follow-Up: At [facility], you and your health needs are our priority.  As part of our continuing mission to provide you with exceptional heart care, we  have created designated Provider Care Teams.  These Care Teams include your primary Cardiologist (physician) and Advanced Practice Providers (APPs -  Physician Assistants and Nurse Practitioners) who all work together to provide you with the care you need, when you need it.  Your next appointment:   As  needed  The format for your next appointment:   In Person  Provider:   Sinclair Grooms, MD {  Other Instructions You have been referred to a primary care provider, a scheduler will call you to arrange an appointment.  Important Information About Sugar         Signed, Sinclair Grooms, MD  11/13/2021 11:01 AM    [place]

## 2021-11-13 ENCOUNTER — Encounter: Payer: Self-pay | Admitting: Interventional Cardiology

## 2021-11-13 ENCOUNTER — Ambulatory Visit: Payer: Medicare HMO | Admitting: Interventional Cardiology

## 2021-11-13 VITALS — BP 122/72 | HR 65 | Ht 68.0 in | Wt 251.2 lb

## 2021-11-13 DIAGNOSIS — Z131 Encounter for screening for diabetes mellitus: Secondary | ICD-10-CM

## 2021-11-13 DIAGNOSIS — R0609 Other forms of dyspnea: Secondary | ICD-10-CM

## 2021-11-13 DIAGNOSIS — J449 Chronic obstructive pulmonary disease, unspecified: Secondary | ICD-10-CM | POA: Diagnosis not present

## 2021-11-13 DIAGNOSIS — I2510 Atherosclerotic heart disease of native coronary artery without angina pectoris: Secondary | ICD-10-CM | POA: Diagnosis not present

## 2021-11-13 DIAGNOSIS — I82431 Acute embolism and thrombosis of right popliteal vein: Secondary | ICD-10-CM

## 2021-11-13 DIAGNOSIS — I2584 Coronary atherosclerosis due to calcified coronary lesion: Secondary | ICD-10-CM

## 2021-11-13 DIAGNOSIS — I10 Essential (primary) hypertension: Secondary | ICD-10-CM

## 2021-11-13 NOTE — Patient Instructions (Signed)
Medication Instructions:  Your physician recommends that you continue on your current medications as directed. Please refer to the Current Medication list given to you today.  *If you need a refill on your cardiac medications before your next appointment, please call your pharmacy*  Lab Work: TODAY: BNP, CMET, Lipid panel, Hgb A1c If you have labs (blood work) drawn today and your tests are completely normal, you will receive your results only by: MyChart Message (if you have MyChart) OR A paper copy in the mail If you have any lab test that is abnormal or we need to change your treatment, we will call you to review the results.  Testing/Procedures: NONE  Follow-Up: At [facility], you and your health needs are our priority.  As part of our continuing mission to provide you with exceptional heart care, we have created designated Provider Care Teams.  These Care Teams include your primary Cardiologist (physician) and Advanced Practice Providers (APPs -  Physician Assistants and Nurse Practitioners) who all work together to provide you with the care you need, when you need it.  Your next appointment:   As needed  The format for your next appointment:   In Person  Provider:   Sinclair Grooms, MD {  Other Instructions You have been referred to a primary care provider, a scheduler will call you to arrange an appointment.  Important Information About Sugar

## 2021-11-14 LAB — COMPREHENSIVE METABOLIC PANEL
ALT: 29 IU/L (ref 0–44)
AST: 26 IU/L (ref 0–40)
Albumin/Globulin Ratio: 1.7 (ref 1.2–2.2)
Albumin: 4.3 g/dL (ref 3.9–4.9)
Alkaline Phosphatase: 85 IU/L (ref 44–121)
BUN/Creatinine Ratio: 13 (ref 10–24)
BUN: 15 mg/dL (ref 8–27)
Bilirubin Total: <0.2 mg/dL (ref 0.0–1.2)
CO2: 25 mmol/L (ref 20–29)
Calcium: 9.7 mg/dL (ref 8.6–10.2)
Chloride: 103 mmol/L (ref 96–106)
Creatinine, Ser: 1.13 mg/dL (ref 0.76–1.27)
Globulin, Total: 2.6 g/dL (ref 1.5–4.5)
Glucose: 122 mg/dL — ABNORMAL HIGH (ref 70–99)
Potassium: 4.2 mmol/L (ref 3.5–5.2)
Sodium: 140 mmol/L (ref 134–144)
Total Protein: 6.9 g/dL (ref 6.0–8.5)
eGFR: 71 mL/min/{1.73_m2} (ref >59)

## 2021-11-14 LAB — LIPID PANEL
Chol/HDL Ratio: 2.9 ratio (ref 0.0–5.0)
Cholesterol, Total: 120 mg/dL (ref 100–199)
HDL: 41 mg/dL (ref >39)
LDL Chol Calc (NIH): 59 mg/dL (ref 0–99)
Triglycerides: 108 mg/dL (ref 0–149)
VLDL Cholesterol Cal: 20 mg/dL (ref 5–40)

## 2021-11-14 LAB — HEMOGLOBIN A1C
Est. average glucose Bld gHb Est-mCnc: 131 mg/dL
Hgb A1c MFr Bld: 6.2 % — ABNORMAL HIGH (ref 4.8–5.6)

## 2021-11-14 LAB — PRO B NATRIURETIC PEPTIDE: NT-Pro BNP: 63 pg/mL (ref 0–376)

## 2021-11-21 ENCOUNTER — Other Ambulatory Visit (HOSPITAL_BASED_OUTPATIENT_CLINIC_OR_DEPARTMENT_OTHER): Payer: Self-pay

## 2021-11-21 DIAGNOSIS — R0683 Snoring: Secondary | ICD-10-CM

## 2021-11-21 DIAGNOSIS — G4710 Hypersomnia, unspecified: Secondary | ICD-10-CM

## 2021-11-21 DIAGNOSIS — R5383 Other fatigue: Secondary | ICD-10-CM

## 2021-11-27 DIAGNOSIS — G894 Chronic pain syndrome: Secondary | ICD-10-CM | POA: Diagnosis not present

## 2021-11-27 DIAGNOSIS — M961 Postlaminectomy syndrome, not elsewhere classified: Secondary | ICD-10-CM | POA: Diagnosis not present

## 2021-11-27 DIAGNOSIS — M542 Cervicalgia: Secondary | ICD-10-CM | POA: Diagnosis not present

## 2021-11-27 DIAGNOSIS — M545 Low back pain, unspecified: Secondary | ICD-10-CM | POA: Diagnosis not present

## 2021-11-27 DIAGNOSIS — Z79891 Long term (current) use of opiate analgesic: Secondary | ICD-10-CM | POA: Diagnosis not present

## 2021-12-08 IMAGING — CT CT CHEST LUNG CANCER SCREENING LOW DOSE W/O CM
1 series · 10 of 10 positions shown, 13 images · non-contrast
Comparison: Low-dose lung cancer screening chest CT 10/03/2019.

CLINICAL DATA: 65-year-old male former smoker (quit 2 years ago)
with 100 pack-year history of smoking. Lung cancer screening
examination.

EXAM:
CT CHEST WITHOUT CONTRAST LOW-DOSE FOR LUNG CANCER SCREENING
TECHNIQUE: Multidetector CT imaging of the chest was performed following the
standard protocol without IV contrast.

[ct lung segmentation data · axial · 0.90mm/px · z∈[-285,-285]mm · 10 of 289 frames shown]
[frame 1/289  mediastinal]
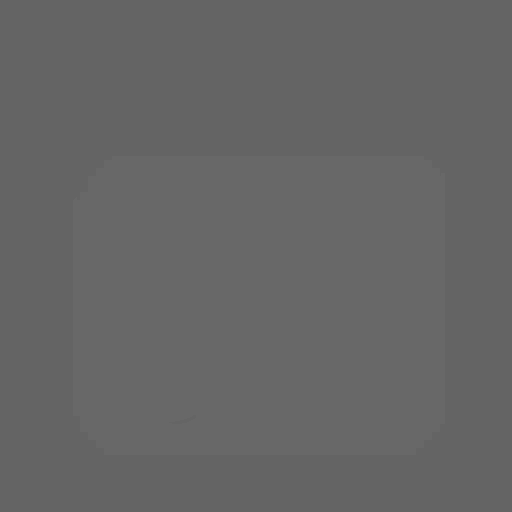
[frame 1/289  lung]
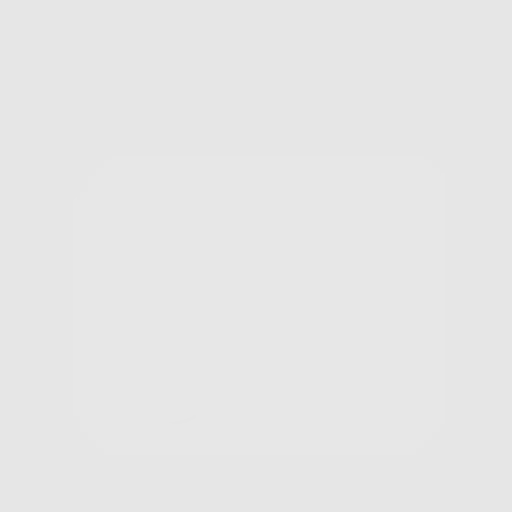
[frame 33/289  lung]
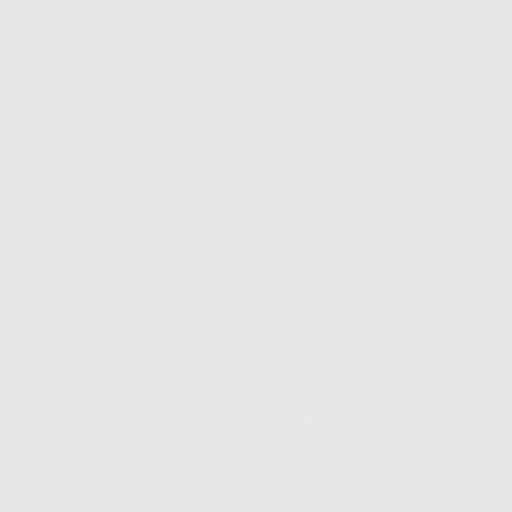
[frame 65/289  lung]
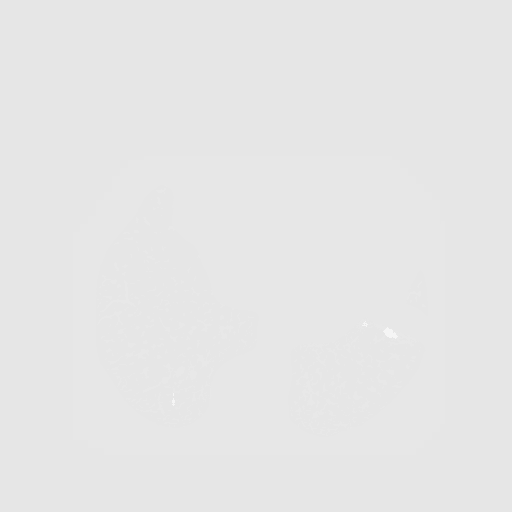
[frame 97/289  lung]
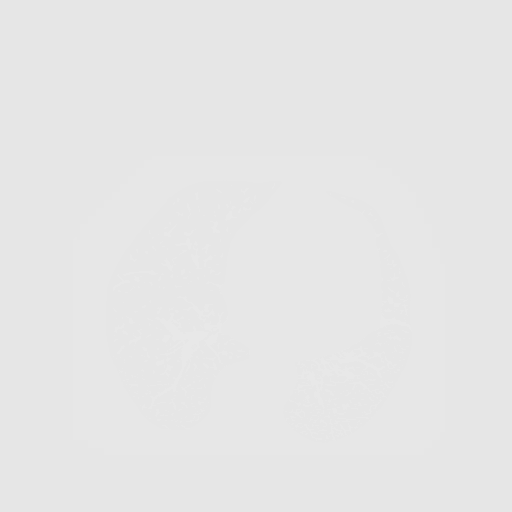
[frame 129/289  mediastinal]
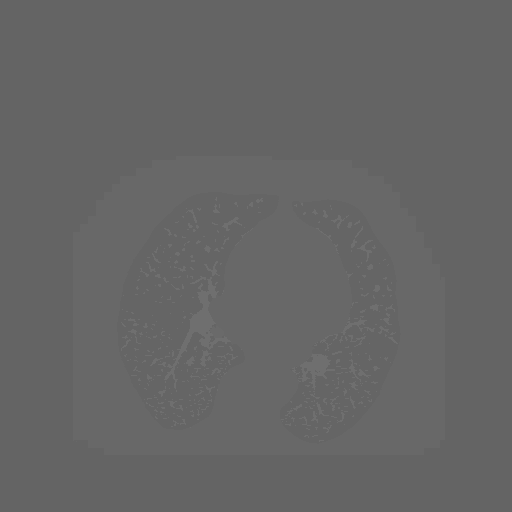
[frame 129/289  lung]
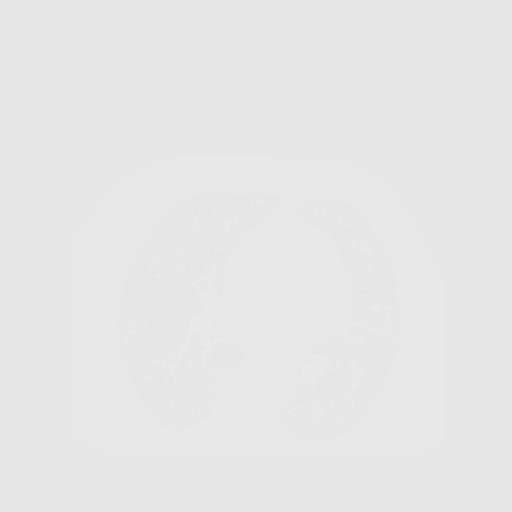
[frame 161/289  lung]
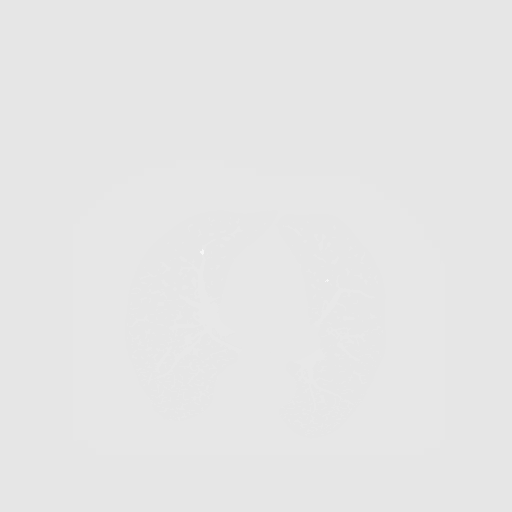
[frame 193/289  lung]
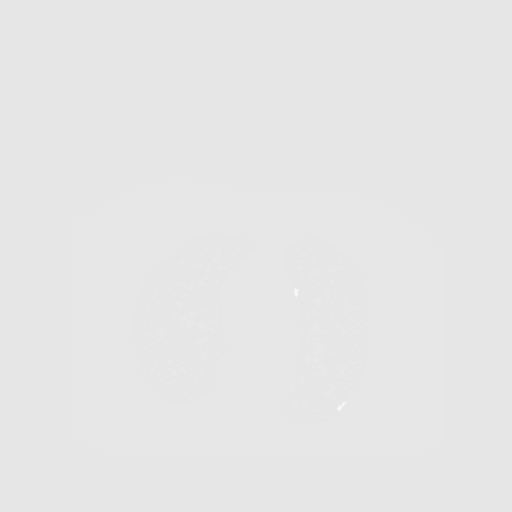
[frame 225/289  lung]
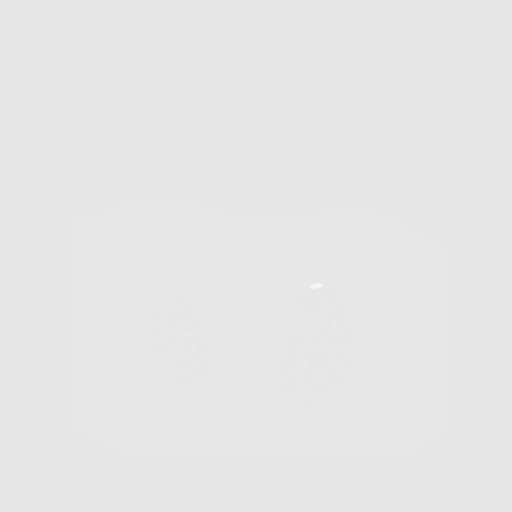
[frame 257/289  mediastinal]
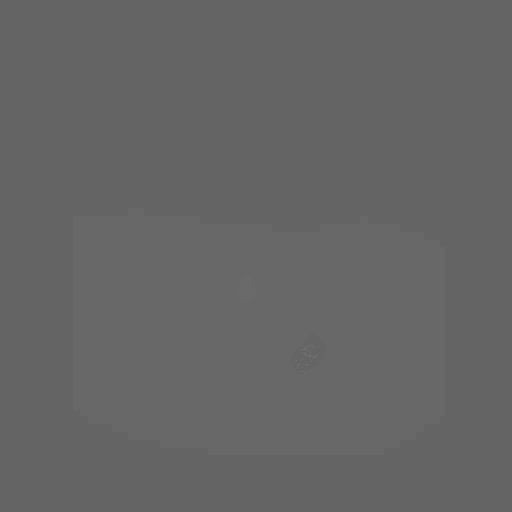
[frame 257/289  lung]
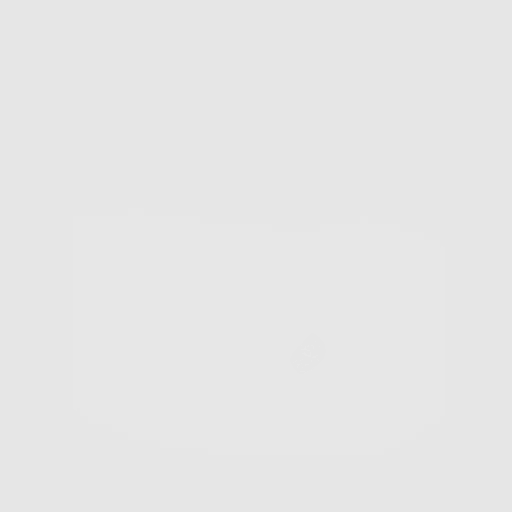
[frame 289/289  lung]
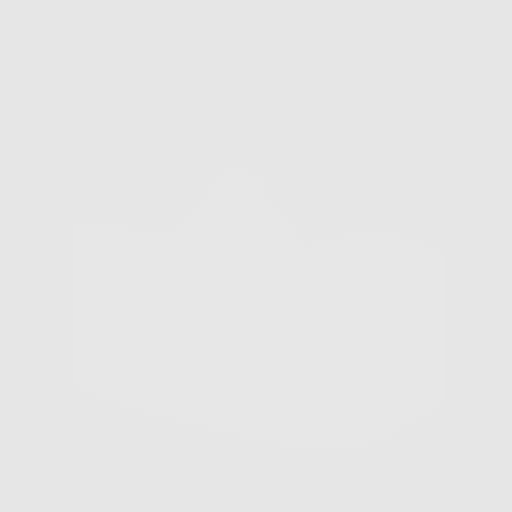

[10 of 10 positions shown; findings below may reference images not displayed]

FINDINGS: Cardiovascular: Heart size is normal. There is no significant
pericardial fluid, thickening or pericardial calcification. There is
aortic atherosclerosis, as well as atherosclerosis of the great
vessels of the mediastinum and the coronary arteries, including
calcified atherosclerotic plaque in the left anterior descending and
left circumflex coronary arteries.

Mediastinum/Nodes: No pathologically enlarged mediastinal or hilar
lymph nodes. Please note that accurate exclusion of hilar adenopathy
is limited on noncontrast CT scans. Esophagus is unremarkable in
appearance. No axillary lymphadenopathy.

Lungs/Pleura: Small pulmonary nodule in the periphery of the left
upper lobe (axial image 146 of series 3), with a volume derived mean
diameter 4.9 mm. No other larger more suspicious appearing pulmonary
nodules or masses are noted. No acute consolidative airspace
disease. No pleural effusions. Mild diffuse bronchial wall
thickening with mild centrilobular and paraseptal emphysema.

Upper Abdomen: Diffuse low attenuation throughout the visualized
hepatic parenchyma, indicative of hepatic steatosis.

Musculoskeletal: There are no aggressive appearing lytic or blastic
lesions noted in the visualized portions of the skeleton.
IMPRESSION: 1. Lung-RADS 2S, benign appearance or behavior. Continue annual
screening with low-dose chest CT without contrast in 12 months.
2. The "S" modifier above refers to potentially clinically
significant non lung cancer related findings. Specifically, there is
aortic atherosclerosis, in addition to 2 vessel coronary artery
disease. Please note that although the presence of coronary artery
calcium documents the presence of coronary artery disease, the
severity of this disease and any potential stenosis cannot be
assessed on this non-gated CT examination. Assessment for potential
risk factor modification, dietary therapy or pharmacologic therapy
may be warranted, if clinically indicated.
3. Mild diffuse bronchial wall thickening with mild centrilobular
and paraseptal emphysema; imaging findings suggestive of underlying
COPD.
4. Hepatic steatosis.

Aortic Atherosclerosis (B1DV7-VIX.X) and Emphysema (B1DV7-XXC.4).

## 2022-01-05 ENCOUNTER — Ambulatory Visit (HOSPITAL_BASED_OUTPATIENT_CLINIC_OR_DEPARTMENT_OTHER): Payer: Medicare HMO | Attending: Family Medicine | Admitting: Internal Medicine

## 2022-01-05 VITALS — Ht 68.0 in | Wt 250.0 lb

## 2022-01-05 DIAGNOSIS — G4710 Hypersomnia, unspecified: Secondary | ICD-10-CM | POA: Diagnosis not present

## 2022-01-05 DIAGNOSIS — G4733 Obstructive sleep apnea (adult) (pediatric): Secondary | ICD-10-CM | POA: Insufficient documentation

## 2022-01-05 DIAGNOSIS — R0683 Snoring: Secondary | ICD-10-CM | POA: Insufficient documentation

## 2022-01-05 DIAGNOSIS — G4736 Sleep related hypoventilation in conditions classified elsewhere: Secondary | ICD-10-CM | POA: Insufficient documentation

## 2022-01-05 DIAGNOSIS — R5383 Other fatigue: Secondary | ICD-10-CM | POA: Insufficient documentation

## 2022-01-05 DIAGNOSIS — Z6838 Body mass index (BMI) 38.0-38.9, adult: Secondary | ICD-10-CM | POA: Diagnosis not present

## 2022-01-05 DIAGNOSIS — E669 Obesity, unspecified: Secondary | ICD-10-CM | POA: Diagnosis not present

## 2022-01-07 DIAGNOSIS — Z79891 Long term (current) use of opiate analgesic: Secondary | ICD-10-CM | POA: Diagnosis not present

## 2022-01-07 DIAGNOSIS — M961 Postlaminectomy syndrome, not elsewhere classified: Secondary | ICD-10-CM | POA: Diagnosis not present

## 2022-01-07 DIAGNOSIS — G894 Chronic pain syndrome: Secondary | ICD-10-CM | POA: Diagnosis not present

## 2022-01-07 DIAGNOSIS — M542 Cervicalgia: Secondary | ICD-10-CM | POA: Diagnosis not present

## 2022-01-07 DIAGNOSIS — M545 Low back pain, unspecified: Secondary | ICD-10-CM | POA: Diagnosis not present

## 2022-01-10 DIAGNOSIS — R0683 Snoring: Secondary | ICD-10-CM | POA: Diagnosis not present

## 2022-01-10 NOTE — Procedures (Signed)
     Patient Name: Evan [name], Evan [name] Date: 01/05/2022 Gender: Male D.O.B: 1956-01-30 Age (years): 42 Referring Provider: Bobbye Riggs NP Height (inches): 51 Interpreting Physician: Baird Lyons MD, ABSM Weight (lbs): 250 RPSGT: Zadie Rhine BMI: 38 MRN: [id number] Neck Size: 18.50  CLINICAL INFORMATION Sleep Study Type: NPSG Indication for sleep study: Obesity, OSA, Snoring Epworth Sleepiness Score: 6  SLEEP STUDY TECHNIQUE As per the AASM Manual for the Scoring of Sleep and Associated Events v2.3 (April 2016) with a hypopnea requiring 4% desaturations.  The channels recorded and monitored were frontal, central and occipital EEG, electrooculogram (EOG), submentalis EMG (chin), nasal and oral airflow, thoracic and abdominal wall motion, anterior tibialis EMG, snore microphone, electrocardiogram, and pulse oximetry.  MEDICATIONS Medications self-administered by patient taken the night of the study : none reported  SLEEP ARCHITECTURE The study was initiated at 9:55:17 PM and ended at 4:00:16 AM.  Sleep onset time was 162.0 minutes and the sleep efficiency was 7.4%%. The total sleep time was 27 minutes.  Stage REM latency was N/A minutes.  The patient spent 35.2%% of the night in stage N1 sleep, 64.8%% in stage N2 sleep, 0.0%% in stage N3 and 0% in REM.  Alpha intrusion was absent.  Supine sleep was 85.19%.  RESPIRATORY PARAMETERS The overall apnea/hypopnea index (AHI) was 42.2 per hour. There were 12 total apneas, including 12 obstructive, 0 central and 0 mixed apneas. There were 7 hypopneas and 18 RERAs.  The AHI during Stage REM sleep was N/A per hour.  AHI while supine was 49.6 per hour.  The mean oxygen saturation was 92.8%. The minimum SpO2 during sleep was 85.0%.  snoring was noted during this study.  CARDIAC DATA The 2 lead EKG demonstrated sinus rhythm. The mean heart rate was 57.8 beats per minute. Other EKG findings include: None.  LEG MOVEMENT  DATA The total PLMS were 0 with a resulting PLMS index of 0.0. Associated arousal with leg movement index was 0.0 .  IMPRESSIONS - Severe obstructive sleep apnea occurred during this study (AHI = 42.2/h). - Mild oxygen desaturation was noted during this study (Min O2 = 85.0%). Mean 92.8% - No snoring was audible during this study. - No cardiac abnormalities were noted during this study. - Clinically significant periodic limb movements did not occur during sleep. No significant associated arousals. - Limited sleep time with insufficient sleep and events to meet protocol requirement for Split CPAP titration. Tech reported patient unable to sleep with out his CPAP mask.  DIAGNOSIS - Obstructive Sleep Apnea (G47.33) - Nocturnal Hypoxemia (G47.36)  RECOMMENDATIONS - Therapeutic CPAP titration to determine optimal pressure required to alleviate sleep disordered breathing. - Positional therapy avoiding supine position during sleep. - Avoid alcohol, sedatives and other CNS depressants that may worsen sleep apnea and disrupt normal sleep architecture. - Sleep hygiene should be reviewed to assess factors that may improve sleep quality. - Weight management and regular exercise should be initiated or continued if appropriate.  [Electronically signed] 01/10/2022 12:42 PM  Baird Lyons MD, [place], American Board of Sleep Medicine NPI: [id number]                        [place], [place] [facility]  ELECTRONICALLY SIGNED ON:  01/10/2022, 12:37 PM [place] PH: [phone]   FX: [phone] [place]

## 2022-02-04 DIAGNOSIS — Z79891 Long term (current) use of opiate analgesic: Secondary | ICD-10-CM | POA: Diagnosis not present

## 2022-02-04 DIAGNOSIS — M542 Cervicalgia: Secondary | ICD-10-CM | POA: Diagnosis not present

## 2022-02-04 DIAGNOSIS — G894 Chronic pain syndrome: Secondary | ICD-10-CM | POA: Diagnosis not present

## 2022-02-04 DIAGNOSIS — M545 Low back pain, unspecified: Secondary | ICD-10-CM | POA: Diagnosis not present

## 2022-02-04 DIAGNOSIS — M961 Postlaminectomy syndrome, not elsewhere classified: Secondary | ICD-10-CM | POA: Diagnosis not present

## 2022-02-06 ENCOUNTER — Ambulatory Visit (INDEPENDENT_AMBULATORY_CARE_PROVIDER_SITE_OTHER): Payer: Medicare HMO | Admitting: Family Medicine

## 2022-02-06 ENCOUNTER — Encounter: Payer: Self-pay | Admitting: Family Medicine

## 2022-02-06 VITALS — BP 139/77 | HR 63 | Temp 98.1°F | Resp 16 | Wt 254.2 lb

## 2022-02-06 DIAGNOSIS — Z23 Encounter for immunization: Secondary | ICD-10-CM | POA: Diagnosis not present

## 2022-02-06 DIAGNOSIS — F32A Depression, unspecified: Secondary | ICD-10-CM | POA: Insufficient documentation

## 2022-02-06 DIAGNOSIS — F3289 Other specified depressive episodes: Secondary | ICD-10-CM

## 2022-02-06 DIAGNOSIS — N401 Enlarged prostate with lower urinary tract symptoms: Secondary | ICD-10-CM | POA: Diagnosis not present

## 2022-02-06 DIAGNOSIS — E785 Hyperlipidemia, unspecified: Secondary | ICD-10-CM

## 2022-02-06 DIAGNOSIS — R69 Illness, unspecified: Secondary | ICD-10-CM | POA: Diagnosis not present

## 2022-02-06 DIAGNOSIS — R7303 Prediabetes: Secondary | ICD-10-CM

## 2022-02-06 DIAGNOSIS — Z6838 Body mass index (BMI) 38.0-38.9, adult: Secondary | ICD-10-CM

## 2022-02-06 DIAGNOSIS — K219 Gastro-esophageal reflux disease without esophagitis: Secondary | ICD-10-CM | POA: Diagnosis not present

## 2022-02-06 DIAGNOSIS — Z7689 Persons encountering health services in other specified circumstances: Secondary | ICD-10-CM

## 2022-02-06 DIAGNOSIS — I10 Essential (primary) hypertension: Secondary | ICD-10-CM

## 2022-02-06 DIAGNOSIS — M810 Age-related osteoporosis without current pathological fracture: Secondary | ICD-10-CM | POA: Insufficient documentation

## 2022-02-06 MED ORDER — LOSARTAN POTASSIUM 25 MG PO TABS: 25.0000 mg | ORAL_TABLET | Freq: Every day | ORAL | 1 refills | Status: DC

## 2022-02-06 MED ORDER — FLUOXETINE HCL 20 MG PO CAPS: 20.0000 mg | ORAL_CAPSULE | Freq: Every day | ORAL | 1 refills | Status: DC

## 2022-02-06 MED ORDER — TAMSULOSIN HCL 0.4 MG PO CAPS: 0.4000 mg | ORAL_CAPSULE | Freq: Every day | ORAL | 1 refills | Status: DC

## 2022-02-06 MED ORDER — AMLODIPINE BESYLATE 10 MG PO TABS: 10.0000 mg | ORAL_TABLET | Freq: Every day | ORAL | 1 refills | Status: DC

## 2022-02-06 MED ORDER — ROSUVASTATIN CALCIUM 20 MG PO TABS: 20.0000 mg | ORAL_TABLET | Freq: Every day | ORAL | 1 refills | Status: DC

## 2022-02-06 MED ORDER — OMEPRAZOLE 40 MG PO CPDR: 40.0000 mg | DELAYED_RELEASE_CAPSULE | Freq: Every day | ORAL | 1 refills | Status: DC

## 2022-02-10 NOTE — Progress Notes (Signed)
New Patient Office Visit  Subjective    Patient ID: Evan [name], male    DOB: [birth year]  Age: 66 y.o. MRN: [id number]  CC:  Chief Complaint  Patient presents with   Establish Care    HPI Evan [name] presents to establish care and for review of chronic med issues including hypertension. Patient denies acute complaints or concerns.    Outpatient Encounter Medications as of 02/06/2022  Medication Sig   albuterol (VENTOLIN HFA) 108 (90 Base) MCG/ACT inhaler Inhale 1-2 puffs into the lungs every 6 (six) hours as needed for wheezing or shortness of breath.   ALPRAZolam (XANAX) 0.5 MG tablet Take 0.5 mg by mouth at bedtime as needed for anxiety.    aspirin EC 81 MG tablet Take 81 mg by mouth daily.   Budesonide-Formoterol Fumarate (SYMBICORT IN) Inhale 10.2 mcg into the lungs daily at 12 noon.   ergocalciferol (VITAMIN D2) 1.25 MG (50000 UT) capsule Take 50,000 Units by mouth once a week. Monday   fexofenadine (ALLEGRA) 180 MG tablet Take 180 mg by mouth daily as needed for allergies.   gabapentin (NEURONTIN) 600 MG tablet Take 600 mg by mouth 2 (two) times daily.   HYDROcodone-acetaminophen (NORCO) 7.5-325 MG tablet Take 1 tablet by mouth every 6 (six) hours as needed for moderate pain or severe pain.    SYMBICORT 160-4.5 MCG/ACT inhaler Inhale 2 puffs into the lungs 2 (two) times daily.   tiZANidine (ZANAFLEX) 4 MG tablet Take 1 tablet (4 mg total) by mouth every 6 (six) hours as needed for muscle spasms.   [DISCONTINUED] amLODipine (NORVASC) 10 MG tablet Take 10 mg by mouth daily.   [DISCONTINUED] FLUoxetine (PROZAC) 20 MG capsule Take 20 mg by mouth daily.   [DISCONTINUED] losartan (COZAAR) 25 MG tablet Take 25 mg by mouth at bedtime.   [DISCONTINUED] omeprazole (PRILOSEC) 40 MG capsule Take 40 mg by mouth daily.    [DISCONTINUED] tamsulosin (FLOMAX) 0.4 MG CAPS capsule Take 0.4 mg by mouth daily.   amLODipine (NORVASC) 10 MG tablet Take 1 tablet (10 mg total) by  mouth daily.   FLUoxetine (PROZAC) 20 MG capsule Take 1 capsule (20 mg total) by mouth daily.   losartan (COZAAR) 25 MG tablet Take 1 tablet (25 mg total) by mouth at bedtime.   omeprazole (PRILOSEC) 40 MG capsule Take 1 capsule (40 mg total) by mouth daily.   rosuvastatin (CRESTOR) 20 MG tablet Take 1 tablet (20 mg total) by mouth daily.   tamsulosin (FLOMAX) 0.4 MG CAPS capsule Take 1 capsule (0.4 mg total) by mouth daily.   [DISCONTINUED] rosuvastatin (CRESTOR) 20 MG tablet Take 1 tablet (20 mg total) by mouth daily.   No facility-administered encounter medications on file as of 02/06/2022.    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   Osteoporosis    Right-sided low back pain with left-sided sciatica    Seizures ([place])  no seizures in many years   Shortness of breath dyspnea    with  exertion   Sleep apnea    last sleep study more than 5 years  uses CPAP even when napping    Past Surgical History:  Procedure Laterality Date   FRACTURE SURGERY     JOINT REPLACEMENT     kidney stones     Left leg     NECK SURGERY     4-5--6 and 7   SPINE SURGERY     TOTAL HIP ARTHROPLASTY Right 07/09/2015   Procedure: RIGHT TOTAL HIP ARTHROPLASTY ANTERIOR APPROACH;  Surgeon: Mcarthur Rossetti, MD;  Location: [place];  Service: Orthopedics;  Laterality: Right;    Family History  Problem Relation Age of Onset   Hepatitis Mother    Alcohol abuse Mother    Depression Mother    Alzheimer's disease Father    COPD Father    Drug abuse Brother      Social History   Socioeconomic History   Marital status: Divorced    Spouse name: Not on file   Number of children: 2   Years of education: 12   Highest education level: Not on file  Occupational History   Not on file  Tobacco Use   Smoking status: Former    Packs/day: 2.00    Years: 45.00    Total pack years: 90.00    Types: Cigarettes    Quit date: 12/15/2017    Years since quitting: 4.1   Smokeless tobacco: Never  Vaping Use   Vaping Use: Never used  Substance and Sexual Activity   Alcohol use: Yes    Alcohol/week: 1.0 - 2.0 standard drink of alcohol    Types: 1 - 2 Shots of liquor per week    Comment: Between supper and bed time   Drug use: No   Sexual activity: Yes    Birth control/protection: Post-menopausal  Other Topics Concern   Not on file  Social History Narrative   Patient is not working he is trying to get disability   Patient is single    Education 12 th grade    Right handed.   Caffeine three cups daily.            Social Determinants of Health   Financial Resource Strain: Not on file  Food Insecurity: Not on file  Transportation Needs: Not on file  Physical Activity: Not on file  Stress: Not on file  Social Connections: Not on file  Intimate Partner Violence: Not on file    Review of Systems  All other systems reviewed and are negative.       Objective    BP 139/77   Pulse 63   Temp 98.1 F (36.7 C) (Oral)   Resp 16   Wt 254 lb 3.2 oz (115.3 kg)   SpO2 94%   BMI 38.65 kg/m   Physical Exam Vitals and nursing note reviewed.  Constitutional:      General: He is not in acute distress.    Appearance: He is obese.  Cardiovascular:     Rate and Rhythm: Normal rate and regular rhythm.  Pulmonary:     Effort: Pulmonary effort is normal.     Breath sounds: Normal breath sounds.  Abdominal:     Palpations: Abdomen is soft.     Tenderness: There is no abdominal tenderness.  Neurological:     General: No focal deficit  present.     Mental Status: He  is alert and oriented to person, place, and time.         Assessment & Plan:   1. Essential hypertension Appears stable with present management. Crestor and amlodipine refilled.   2. Hyperlipidemia, unspecified hyperlipidemia type Continue - crestor refilled.   3. Prediabetes Continue with dietary and activity options.   4. Gastroesophageal reflux disease without esophagitis Continue. Omeprazole refilled.   5. Class 2 severe obesity due to excess calories with serious comorbidity and body mass index (BMI) of 38.0 to 38.9 in adult [facility]) Discussed dietary and activity options.   6. Other depression Appears stable. Continue prozac refilled.   7. Benign prostatic hyperplasia with lower urinary tract symptoms, symptom details unspecified Continue. Flomax refilled.   8. Need for vaccination  - Flu Vaccine QUAD High Dose(Fluad)  9. Encounter to establish care     Return in about 4 months (around 06/09/2022) for physical.   Becky Sax, MD

## 2022-02-12 ENCOUNTER — Telehealth: Payer: Self-pay | Admitting: Family Medicine

## 2022-02-12 NOTE — Telephone Encounter (Addendum)
Patient called in requesting cpap machine. He had another sleep study at [facility], 09/11.Marland Kitchen  He is getting a error message saying its passed life expectancy .and motorlife exceeded.  It smells funny at times.  He got the machine from [employer].

## 2022-02-13 NOTE — Telephone Encounter (Signed)
Patient given  appointment to see provider.

## 2022-02-18 ENCOUNTER — Ambulatory Visit
Admission: RE | Admit: 2022-02-18 | Discharge: 2022-02-18 | Disposition: A | Discharge status: Home / Self Care | Payer: Medicare HMO | Source: Ambulatory Visit | Attending: Nurse Practitioner | Admitting: Nurse Practitioner

## 2022-02-18 ENCOUNTER — Other Ambulatory Visit: Payer: Self-pay | Admitting: Nurse Practitioner

## 2022-02-18 DIAGNOSIS — M5136 Other intervertebral disc degeneration, lumbar region: Secondary | ICD-10-CM | POA: Diagnosis not present

## 2022-02-18 DIAGNOSIS — M545 Low back pain, unspecified: Secondary | ICD-10-CM

## 2022-02-18 DIAGNOSIS — Z981 Arthrodesis status: Secondary | ICD-10-CM | POA: Diagnosis not present

## 2022-02-18 DIAGNOSIS — M48061 Spinal stenosis, lumbar region without neurogenic claudication: Secondary | ICD-10-CM | POA: Diagnosis not present

## 2022-02-18 DIAGNOSIS — M4316 Spondylolisthesis, lumbar region: Secondary | ICD-10-CM | POA: Diagnosis not present

## 2022-03-03 ENCOUNTER — Ambulatory Visit (INDEPENDENT_AMBULATORY_CARE_PROVIDER_SITE_OTHER): Payer: Medicare HMO | Admitting: Family Medicine

## 2022-03-03 ENCOUNTER — Encounter: Payer: Self-pay | Admitting: Family Medicine

## 2022-03-03 VITALS — BP 123/81 | HR 71 | Temp 98.1°F | Resp 18 | Ht 68.0 in | Wt 248.8 lb

## 2022-03-03 DIAGNOSIS — Z6838 Body mass index (BMI) 38.0-38.9, adult: Secondary | ICD-10-CM | POA: Diagnosis not present

## 2022-03-03 DIAGNOSIS — F3289 Other specified depressive episodes: Secondary | ICD-10-CM

## 2022-03-03 DIAGNOSIS — Z23 Encounter for immunization: Secondary | ICD-10-CM | POA: Diagnosis not present

## 2022-03-03 DIAGNOSIS — R69 Illness, unspecified: Secondary | ICD-10-CM | POA: Diagnosis not present

## 2022-03-03 DIAGNOSIS — G4733 Obstructive sleep apnea (adult) (pediatric): Secondary | ICD-10-CM | POA: Diagnosis not present

## 2022-03-05 ENCOUNTER — Encounter: Payer: Self-pay | Admitting: Family Medicine

## 2022-03-05 NOTE — Progress Notes (Signed)
Established Patient Office Visit  Subjective    Patient ID: Evan [name], male    DOB: Jan 02, 1956  Age: 66 y.o. MRN: [id number]  CC:  Chief Complaint  Patient presents with   Follow-up    CPAP    HPI [name] presents for follow up of OSA. Patient denies acute complaints.    Outpatient Encounter Medications as of 03/03/2022  Medication Sig   albuterol (VENTOLIN HFA) 108 (90 Base) MCG/ACT inhaler Inhale 1-2 puffs into the lungs every 6 (six) hours as needed for wheezing or shortness of breath.   ALPRAZolam (XANAX) 0.5 MG tablet Take 0.5 mg by mouth at bedtime as needed for anxiety.    amLODipine (NORVASC) 10 MG tablet Take 1 tablet (10 mg total) by mouth daily.   aspirin EC 81 MG tablet Take 81 mg by mouth daily.   Budesonide-Formoterol Fumarate (SYMBICORT IN) Inhale 10.2 mcg into the lungs daily at 12 noon.   ergocalciferol (VITAMIN D2) 1.25 MG (50000 UT) capsule Take 50,000 Units by mouth once a week. Monday   fexofenadine (ALLEGRA) 180 MG tablet Take 180 mg by mouth daily as needed for allergies.   FLUoxetine (PROZAC) 20 MG capsule Take 1 capsule (20 mg total) by mouth daily.   gabapentin (NEURONTIN) 600 MG tablet Take 600 mg by mouth 2 (two) times daily.   HYDROcodone-acetaminophen (NORCO) 7.5-325 MG tablet Take 1 tablet by mouth every 6 (six) hours as needed for moderate pain or severe pain.    losartan (COZAAR) 25 MG tablet Take 1 tablet (25 mg total) by mouth at bedtime.   omeprazole (PRILOSEC) 40 MG capsule Take 1 capsule (40 mg total) by mouth daily.   rosuvastatin (CRESTOR) 20 MG tablet Take 1 tablet (20 mg total) by mouth daily.   SYMBICORT 160-4.5 MCG/ACT inhaler Inhale 2 puffs into the lungs 2 (two) times daily.   tamsulosin (FLOMAX) 0.4 MG CAPS capsule Take 1 capsule (0.4 mg total) by mouth daily.   tiZANidine (ZANAFLEX) 4 MG tablet Take 1 tablet (4 mg total) by mouth every 6 (six) hours as needed for muscle spasms.   No facility-administered  encounter medications on file as of 03/03/2022.    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   Osteoporosis    Right-sided low back pain with left-sided sciatica    Seizures (HCC)    no seizures in many years   Shortness of breath dyspnea    with  exertion   Sleep apnea    last sleep study more than 5 years  uses CPAP even when napping    Past Surgical History:  Procedure Laterality Date   FRACTURE SURGERY     JOINT REPLACEMENT     kidney stones     Left leg     NECK SURGERY     4-5--6 and 7   SPINE SURGERY     TOTAL HIP ARTHROPLASTY Right 07/09/2015  Procedure: RIGHT TOTAL HIP ARTHROPLASTY ANTERIOR APPROACH;  Surgeon: Mcarthur Rossetti, MD;  Location: [place];  Service: Orthopedics;  Laterality: Right;    Family History  Problem Relation Age of Onset   Hepatitis Mother    Alcohol abuse Mother    Depression Mother    Alzheimer's disease Father    COPD Father    Drug abuse Brother     Social History   Socioeconomic History   Marital status: Divorced    Spouse name: Not on file   Number of children: 2   Years of education: 12   Highest education level: Not on file  Occupational History   Not on file  Tobacco Use   Smoking status: Former    Packs/day: 2.00    Years: 45.00    Total pack years: 90.00    Types: Cigarettes    Quit date: 12/15/2017    Years since quitting: 4.2   Smokeless tobacco: Never  Vaping Use   Vaping Use: Never used  Substance and Sexual Activity   Alcohol use: Yes    Alcohol/week: 1.0 - 2.0  standard drink of alcohol    Types: 1 - 2 Shots of liquor per week    Comment: Between supper and bed time   Drug use: No   Sexual activity: Yes    Birth control/protection: Post-menopausal  Other Topics Concern   Not on file  Social History Narrative   Patient is not working he is trying to get disability   Patient is single    Education 12 th grade    Right handed.   Caffeine three cups daily.            Social Determinants of Health   Financial Resource Strain: Not on file  Food Insecurity: Not on file  Transportation Needs: Not on file  Physical Activity: Not on file  Stress: Not on file  Social Connections: Not on file  Intimate Partner Violence: Not on file    Review of Systems  All other systems reviewed and are negative.       Objective    BP 123/81   Pulse 71   Temp 98.1 F (36.7 C) (Oral)   Resp 18   Ht '5\' 8"'$  (1.727 m)   Wt 248 lb 12.8 oz (112.9 kg)   SpO2 94%   BMI 37.83 kg/m   Physical Exam Vitals and nursing note reviewed.  Constitutional:      General: He is not in acute distress.    Appearance: He is obese.  Cardiovascular:     Rate and Rhythm: Normal rate and regular rhythm.  Pulmonary:     Effort: Pulmonary effort is normal.     Breath sounds: Normal breath sounds.  Neurological:     General: No focal deficit present.     Mental Status: He is alert and oriented to person, place, and time.  Psychiatric:        Mood and Affect: Mood normal.        Behavior: Behavior normal.         Assessment & Plan:   1. OSA (obstructive sleep apnea) Referral for CPAP - For home use only DME continuous positive airway pressure (CPAP)  2. Class 2 severe obesity due to excess calories with serious comorbidity and body mass index (BMI) of 38.0 to 38.9 in adult (HCC)   3. Other depression Appears stable. Continue   4. Need for vaccination  - Pneumococcal polysaccharide vaccine 23-valent greater  than or equal to 2yo  subcutaneous/IM    No follow-ups on file.   Becky Sax, MD

## 2022-03-09 DIAGNOSIS — M545 Low back pain, unspecified: Secondary | ICD-10-CM | POA: Diagnosis not present

## 2022-03-09 DIAGNOSIS — G894 Chronic pain syndrome: Secondary | ICD-10-CM | POA: Diagnosis not present

## 2022-03-09 DIAGNOSIS — Z79891 Long term (current) use of opiate analgesic: Secondary | ICD-10-CM | POA: Diagnosis not present

## 2022-03-09 DIAGNOSIS — M961 Postlaminectomy syndrome, not elsewhere classified: Secondary | ICD-10-CM | POA: Diagnosis not present

## 2022-03-27 DIAGNOSIS — M532X6 Spinal instabilities, lumbar region: Secondary | ICD-10-CM | POA: Diagnosis not present

## 2022-03-27 DIAGNOSIS — M5416 Radiculopathy, lumbar region: Secondary | ICD-10-CM | POA: Diagnosis not present

## 2022-03-31 DIAGNOSIS — M532X6 Spinal instabilities, lumbar region: Secondary | ICD-10-CM | POA: Diagnosis not present

## 2022-03-31 DIAGNOSIS — M5416 Radiculopathy, lumbar region: Secondary | ICD-10-CM | POA: Diagnosis not present

## 2022-04-01 ENCOUNTER — Telehealth: Payer: Self-pay | Admitting: *Deleted

## 2022-04-01 NOTE — Telephone Encounter (Signed)
Patient has been called and form faxed to [facility] for CPAP supplies

## 2022-04-06 DIAGNOSIS — G894 Chronic pain syndrome: Secondary | ICD-10-CM | POA: Diagnosis not present

## 2022-04-06 DIAGNOSIS — Z79891 Long term (current) use of opiate analgesic: Secondary | ICD-10-CM | POA: Diagnosis not present

## 2022-04-06 DIAGNOSIS — M5416 Radiculopathy, lumbar region: Secondary | ICD-10-CM | POA: Diagnosis not present

## 2022-04-06 DIAGNOSIS — M532X6 Spinal instabilities, lumbar region: Secondary | ICD-10-CM | POA: Diagnosis not present

## 2022-04-06 DIAGNOSIS — M545 Low back pain, unspecified: Secondary | ICD-10-CM | POA: Diagnosis not present

## 2022-04-06 DIAGNOSIS — M961 Postlaminectomy syndrome, not elsewhere classified: Secondary | ICD-10-CM | POA: Diagnosis not present

## 2022-04-07 DIAGNOSIS — M4726 Other spondylosis with radiculopathy, lumbar region: Secondary | ICD-10-CM | POA: Diagnosis not present

## 2022-04-07 DIAGNOSIS — M4316 Spondylolisthesis, lumbar region: Secondary | ICD-10-CM | POA: Diagnosis not present

## 2022-04-07 DIAGNOSIS — G4733 Obstructive sleep apnea (adult) (pediatric): Secondary | ICD-10-CM | POA: Diagnosis not present

## 2022-04-08 DIAGNOSIS — M532X6 Spinal instabilities, lumbar region: Secondary | ICD-10-CM | POA: Diagnosis not present

## 2022-04-08 DIAGNOSIS — M5416 Radiculopathy, lumbar region: Secondary | ICD-10-CM | POA: Diagnosis not present

## 2022-04-13 DIAGNOSIS — M532X6 Spinal instabilities, lumbar region: Secondary | ICD-10-CM | POA: Diagnosis not present

## 2022-04-13 DIAGNOSIS — M5416 Radiculopathy, lumbar region: Secondary | ICD-10-CM | POA: Diagnosis not present

## 2022-04-15 DIAGNOSIS — M532X6 Spinal instabilities, lumbar region: Secondary | ICD-10-CM | POA: Diagnosis not present

## 2022-04-15 DIAGNOSIS — M5416 Radiculopathy, lumbar region: Secondary | ICD-10-CM | POA: Diagnosis not present

## 2022-04-22 ENCOUNTER — Other Ambulatory Visit: Payer: Self-pay | Admitting: Neurosurgery

## 2022-04-22 ENCOUNTER — Encounter: Payer: Self-pay | Admitting: Neurosurgery

## 2022-04-22 DIAGNOSIS — M5416 Radiculopathy, lumbar region: Secondary | ICD-10-CM | POA: Diagnosis not present

## 2022-04-22 DIAGNOSIS — M532X6 Spinal instabilities, lumbar region: Secondary | ICD-10-CM | POA: Diagnosis not present

## 2022-04-22 DIAGNOSIS — M4316 Spondylolisthesis, lumbar region: Secondary | ICD-10-CM

## 2022-04-22 DIAGNOSIS — M4726 Other spondylosis with radiculopathy, lumbar region: Secondary | ICD-10-CM

## 2022-05-04 DIAGNOSIS — G894 Chronic pain syndrome: Secondary | ICD-10-CM | POA: Diagnosis not present

## 2022-05-04 DIAGNOSIS — M961 Postlaminectomy syndrome, not elsewhere classified: Secondary | ICD-10-CM | POA: Diagnosis not present

## 2022-05-04 DIAGNOSIS — Z79891 Long term (current) use of opiate analgesic: Secondary | ICD-10-CM | POA: Diagnosis not present

## 2022-05-04 DIAGNOSIS — M545 Low back pain, unspecified: Secondary | ICD-10-CM | POA: Diagnosis not present

## 2022-05-08 DIAGNOSIS — G4733 Obstructive sleep apnea (adult) (pediatric): Secondary | ICD-10-CM | POA: Diagnosis not present

## 2022-05-20 ENCOUNTER — Ambulatory Visit (INDEPENDENT_AMBULATORY_CARE_PROVIDER_SITE_OTHER): Payer: Medicare HMO

## 2022-05-20 VITALS — Ht 68.0 in | Wt 250.0 lb

## 2022-05-20 DIAGNOSIS — Z0000 Encounter for general adult medical examination without abnormal findings: Secondary | ICD-10-CM | POA: Diagnosis not present

## 2022-05-20 NOTE — Progress Notes (Signed)
I connected with Evan [name] today by telephone and verified that I am speaking with the correct person using two identifiers. Location patient: home Location provider: work Persons participating in the virtual visit: Hermon Phillip, Maffei LPN.   I discussed the limitations, risks, security and privacy concerns of performing an evaluation and management service by telephone and the availability of in person appointments. I also discussed with the patient that there may be a patient responsible charge related to this service. The patient expressed understanding and verbally consented to this telephonic visit.    Interactive audio and video telecommunications were attempted between this provider and patient, however failed, due to patient having technical difficulties OR patient did not have access to video capability.  We continued and completed visit with audio only.     Vital signs may be patient reported or missing.  Subjective:   Evan [name] is a 67 y.o. male who presents for an Initial Medicare Annual Wellness Visit.  Review of Systems     Cardiac Risk Factors include: advanced age (>86mn, >>50women);hypertension;male gender;obesity (BMI >30kg/m2)     Objective:    Today's Vitals   05/20/22 1141  Weight: 250 lb (113.4 kg)  Height: '5\' 8"'$  (1.727 m)  PainSc: 4    Body mass index is 38.01 kg/m.     05/20/2022   11:47 AM 01/05/2022   10:49 PM 10/03/2019    1:48 PM 07/06/2019    8:00 AM 07/05/2019    7:35 AM 07/03/2019   10:20 AM 10/03/2018   11:39 AM  Advanced Directives  Does Patient Have a Medical Advance Directive? No No No No No No No  Would patient like information on creating a medical advance directive?  No - Patient declined  No - Patient declined No - Patient declined Yes (MAU/Ambulatory/Procedural Areas - Information given) No - Patient declined    Current Medications (verified) Outpatient Encounter Medications as of 05/20/2022  Medication Sig    albuterol (VENTOLIN HFA) 108 (90 Base) MCG/ACT inhaler Inhale 1-2 puffs into the lungs every 6 (six) hours as needed for wheezing or shortness of breath.   ALPRAZolam (XANAX) 0.5 MG tablet Take 0.5 mg by mouth at bedtime as needed for anxiety.    amLODipine (NORVASC) 10 MG tablet Take 1 tablet (10 mg total) by mouth daily.   aspirin EC 81 MG tablet Take 81 mg by mouth daily.   Budesonide-Formoterol Fumarate (SYMBICORT IN) Inhale 10.2 mcg into the lungs daily at 12 noon.   ergocalciferol (VITAMIN D2) 1.25 MG (50000 UT) capsule Take 50,000 Units by mouth once a week. Monday   fexofenadine (ALLEGRA) 180 MG tablet Take 180 mg by mouth daily as needed for allergies.   FLUoxetine (PROZAC) 20 MG capsule Take 1 capsule (20 mg total) by mouth daily.   gabapentin (NEURONTIN) 600 MG tablet Take 600 mg by mouth 2 (two) times daily.   HYDROcodone-acetaminophen (NORCO) 7.5-325 MG tablet Take 1 tablet by mouth every 6 (six) hours as needed for moderate pain or severe pain.    losartan (COZAAR) 25 MG tablet Take 1 tablet (25 mg total) by mouth at bedtime.   omeprazole (PRILOSEC) 40 MG capsule Take 1 capsule (40 mg total) by mouth daily.   rosuvastatin (CRESTOR) 20 MG tablet Take 1 tablet (20 mg total) by mouth daily.   SYMBICORT 160-4.5 MCG/ACT inhaler Inhale 2 puffs into the lungs 2 (two) times daily.   tamsulosin (FLOMAX) 0.4 MG CAPS capsule Take 1 capsule (0.4 mg  total) by mouth daily.   tiZANidine (ZANAFLEX) 4 MG tablet Take 1 tablet (4 mg total) by mouth every 6 (six) hours as needed for muscle spasms.   No facility-administered encounter medications on file as of 05/20/2022.    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   Osteoporosis    Right-sided low back pain with left-sided sciatica    Seizures (HCC)    no seizures in many years   Shortness of breath dyspnea    with  exertion   Sleep apnea    last sleep study more than 5 years  uses CPAP even when napping   Past Surgical History:  Procedure Laterality Date   FRACTURE SURGERY     JOINT REPLACEMENT     kidney stones     Left leg     NECK SURGERY     4-5--6 and 7   SPINE SURGERY     TOTAL HIP ARTHROPLASTY Right 07/09/2015   Procedure: RIGHT TOTAL HIP ARTHROPLASTY ANTERIOR APPROACH;  Surgeon: Mcarthur Rossetti, MD;  Location: [facility];  Service: Orthopedics;  Laterality: Right;   Family History  Problem Relation Age of Onset   Hepatitis Mother    Alcohol abuse Mother    Depression Mother    Alzheimer's disease Father    COPD Father    Drug abuse Brother    Social History   Socioeconomic History   Marital status: Divorced    Spouse name: Not on file   Number of children: 2   Years of education: 12   Highest education level: Not on file  Occupational History   Not on file  Tobacco Use   Smoking status: Former    Packs/day: 2.00    Years: 45.00    Total pack years: 90.00    Types: Cigarettes    Quit date: 12/15/2017    Years since quitting: 4.4   Smokeless tobacco: Never  Vaping Use   Vaping Use: Never used  Substance and Sexual Activity   Alcohol use: Yes    Alcohol/week: 1.0 - 2.0 standard drink of alcohol    Types: 1 - 2 Shots of liquor per week    Comment: Between supper and bed time   Drug use: Yes    Types: Hydrocodone   Sexual activity: Yes    Birth control/protection: Post-menopausal  Other Topics Concern   Not on file  Social History Narrative   Patient is not working he  is trying to get disability   Patient is single    Education 12 th grade    Right handed.   Caffeine three cups daily.            Social Determinants of Health   Financial Resource Strain: Low Risk  (05/20/2022)   Overall Financial Resource Strain (CARDIA)    Difficulty of Paying Living Expenses: Not  hard at all  Food Insecurity: No Food Insecurity (05/20/2022)   Hunger Vital Sign    Worried About Running Out of Food in the Last Year: Never true    Ran Out of Food in the Last Year: Never true  Transportation Needs: No Transportation Needs (05/20/2022)   PRAPARE - Hydrologist (Medical): No    Lack of Transportation (Non-Medical): No  Physical Activity: Inactive (05/20/2022)   Exercise Vital Sign    Days of Exercise per Week: 0 days    Minutes of Exercise per Session: 0 min  Stress: No Stress Concern Present (05/20/2022)   [place]    Feeling of Stress : Only a little  Social Connections: Not on file    Tobacco Counseling Counseling given: Not Answered   Clinical Intake:  Pre-visit preparation completed: Yes  Pain : 0-10 Pain Score: 4  Pain Type: Chronic pain Pain Location: Back Pain Orientation: Lower Pain Descriptors / Indicators: Aching Pain Onset: More than a month ago Pain Frequency: Constant     Nutritional Status: BMI > 30  Obese Nutritional Risks: None Diabetes: No  How often do you need to have someone help you when you read instructions, pamphlets, or other written materials from your doctor or pharmacy?: 3 - Sometimes  Diabetic? no  Interpreter Needed?: No  Information entered by :: NAllen LPN   Activities of Daily Living    05/20/2022   11:48 AM 05/19/2022   12:46 PM  In your present state of health, do you have any difficulty performing the following activities:  Hearing? 0 0  Vision? 1 1  Comment trouble focusing   Difficulty concentrating or  making decisions? 1 1  Walking or climbing stairs? 1 1  Dressing or bathing? 0 1  Doing errands, shopping? 1 1  Preparing Food and eating ? N Y  Using the Toilet? N N  In the past six months, have you accidently leaked urine? N N  Do you have problems with loss of bowel control? N N  Managing your Medications? N N  Managing your Finances? N N  Housekeeping or managing your Housekeeping? Aggie Moats    Patient Care Team: Dorna Mai, MD as PCP - General (Family Medicine) Belva Crome, MD as PCP - Cardiology (Cardiology)  Indicate any recent Medical Services you may have received from other than [facility] providers in the past year (date may be approximate).     Assessment:   This is a routine wellness examination for Evan [name].  Hearing/Vision screen Vision Screening - Comments:: No regular eye exams,  Dietary issues and exercise activities discussed: Current Exercise Habits: The patient does not participate in regular exercise at present   Goals Addressed             This Visit's Progress    Patient Stated       05/20/2022, no goals       Depression Screen    05/20/2022   11:48 AM 03/03/2022    8:26 AM 02/06/2022    8:18 AM  PHQ 2/9 Scores  PHQ - 2 Score 0 2 0  PHQ- 9 Score  12 0    Fall Risk    05/20/2022   11:47 AM 05/19/2022   12:46 PM  [place] in the past year? 1 1  Comment trips of over dog sometimes   Number falls in past yr: 0 0  Injury  with Fall? 0 0  Risk for fall due to : Impaired mobility;Medication side effect   Follow up Falls evaluation completed;Education provided;Falls prevention discussed     FALL RISK PREVENTION PERTAINING TO THE HOME:  Any stairs in or around the home? Yes  If so, are there any without handrails? No  Home free of loose throw rugs in walkways, pet beds, electrical cords, etc? Yes  Adequate lighting in your home to reduce risk of falls? Yes   ASSISTIVE DEVICES UTILIZED TO PREVENT FALLS:  Life alert? No  Use of a  cane, walker or w/c? Yes  Grab bars in the bathroom? No  Shower chair or bench in shower? Yes  Elevated toilet seat or a handicapped toilet? Yes   TIMED UP AND GO:  Was the test performed? No .      Cognitive Function:        05/20/2022   11:50 AM  6CIT Screen  What Year? 0 points  What month? 0 points  What time? 0 points  Count back from 20 0 points  Months in reverse 0 points  Repeat phrase 2 points  Total Score 2 points    Immunizations Immunization History  Administered Date(s) Administered   Fluad Quad(high Dose 65+) 02/06/2022   Influenza Inj Mdck Quad Pf 01/31/2020   Influenza Inj Mdck Quad With Preservative 02/27/2021   Influenza, Seasonal, Injecte, Preservative Fre 04/27/2010   Influenza,inj,quad, With Preservative 02/01/2018, 03/17/2019   Pneumococcal Conjugate-13 05/06/2017   Pneumococcal Polysaccharide-23 03/03/2022   Tdap 04/28/2003    TDAP status: Due, Education has been provided regarding the importance of this vaccine. Advised may receive this vaccine at local pharmacy or Health Dept. Aware to provide a copy of the vaccination record if obtained from local pharmacy or Health Dept. Verbalized acceptance and understanding.  Flu Vaccine status: Up to date  Pneumococcal vaccine status: Up to date  Covid-19 vaccine status: Completed vaccines  Qualifies for Shingles Vaccine? Yes   Zostavax completed No   Shingrix Completed?: No.    Education has been provided regarding the importance of this vaccine. Patient has been advised to call insurance company to determine out of pocket expense if they have not yet received this vaccine. Advised may also receive vaccine at local pharmacy or Health Dept. Verbalized acceptance and understanding.  Screening Tests Health Maintenance  Topic Date Due   Medicare Annual Wellness (AWV)  Never done   Hepatitis C Screening  Never done   Zoster Vaccines- Shingrix (1 of 2) Never done   DTaP/Tdap/Td (2 - Td or Tdap)  04/27/2013   Lung Cancer Screening  08/15/2021   COLONOSCOPY (Pts 45-25yr Insurance coverage will need to be confirmed)  09/01/2022 (Originally 05/13/1999)   Pneumonia Vaccine 68 Years old  Completed   INFLUENZA VACCINE  Completed   HPV VACCINES  Aged Out   COVID-19 Vaccine  Discontinued    Health Maintenance  Health Maintenance Due  Topic Date Due   Medicare Annual Wellness (AWV)  Never done   Hepatitis C Screening  Never done   Zoster Vaccines- Shingrix (1 of 2) Never done   DTaP/Tdap/Td (2 - Td or Tdap) 04/27/2013   Lung Cancer Screening  08/15/2021    Colorectal cancer screening: Type of screening: Colonoscopy. Completed 2021. Repeat every 5 years  Lung Cancer Screening: (Low Dose CT Chest recommended if Age 68-80years, 30 pack-year currently smoking OR have quit w/in 15years.) does not qualify.   Lung Cancer Screening Referral: no  Additional  Screening:  Hepatitis C Screening: does qualify;   Vision Screening: Recommended annual ophthalmology exams for early detection of glaucoma and other disorders of the eye. Is the patient up to date with their annual eye exam?  No  Who is the provider or what is the name of the office in which the patient attends annual eye exams? none If pt is not established with a provider, would they like to be referred to a provider to establish care? No .   Dental Screening: Recommended annual dental exams for proper oral hygiene  Community Resource Referral / Chronic Care Management: CRR required this visit?  No   CCM required this visit?  No      Plan:     I have personally reviewed and noted the following in the patient's chart:   Medical and social history Use of alcohol, tobacco or illicit drugs  Current medications and supplements including opioid prescriptions. Patient is currently taking opioid prescriptions. Information provided to patient regarding non-opioid alternatives. Patient advised to discuss non-opioid treatment  plan with their provider. Functional ability and status Nutritional status Physical activity Advanced directives List of other physicians Hospitalizations, surgeries, and ER visits in previous 12 months Vitals Screenings to include cognitive, depression, and falls Referrals and appointments  In addition, I have reviewed and discussed with patient certain preventive protocols, quality metrics, and best practice recommendations. A written personalized care plan for preventive services as well as general preventive health recommendations were provided to patient.     Kellie Simmering, LPN   1/61/0960   Nurse Notes: none  Due to this being a virtual visit, the after visit summary with patients personalized plan was offered to patient via mail or my-chart.  Patient would like to access on my-chart

## 2022-05-20 NOTE — Patient Instructions (Signed)
Mr. Evan [name] , Thank you for taking time to come for your Medicare Wellness Visit. I appreciate your ongoing commitment to your health goals. Please review the following plan we discussed and let me know if I can assist you in the future.   These are the goals we discussed:  Goals      Patient Stated     05/20/2022, no goals        This is a list of the screening recommended for you and due dates:  Health Maintenance  Topic Date Due   Hepatitis C Screening: USPSTF Recommendation to screen - Ages 6-79 yo.  Never done   Zoster (Shingles) Vaccine (1 of 2) Never done   DTaP/Tdap/Td vaccine (2 - Td or Tdap) 04/27/2013   Screening for Lung Cancer  08/15/2021   Colon Cancer Screening  09/01/2022*   Medicare Annual Wellness Visit  05/21/2023   Pneumonia Vaccine  Completed   Flu Shot  Completed   HPV Vaccine  Aged Out   COVID-19 Vaccine  Discontinued  *Topic was postponed. The date shown is not the original due date.    Advanced directives: Advance directive discussed with you today.   Conditions/risks identified: none  Next appointment: Follow up in one year for your annual wellness visit.   Preventive Care 29 Years and Older, Male  Preventive care refers to lifestyle choices and visits with your health care provider that can promote health and wellness. What does preventive care include? A yearly physical exam. This is also called an annual well check. Dental exams once or twice a year. Routine eye exams. Ask your health care provider how often you should have your eyes checked. Personal lifestyle choices, including: Daily care of your teeth and gums. Regular physical activity. Eating a healthy diet. Avoiding tobacco and drug use. Limiting alcohol use. Practicing safe sex. Taking low doses of aspirin every day. Taking vitamin and mineral supplements as recommended by your health care provider. What happens during an annual well check? The services and screenings done by  your health care provider during your annual well check will depend on your age, overall health, lifestyle risk factors, and family history of disease. Counseling  Your health care provider may ask you questions about your: Alcohol use. Tobacco use. Drug use. Emotional well-being. Home and relationship well-being. Sexual activity. Eating habits. History of falls. Memory and ability to understand (cognition). Work and work Statistician. Screening  You may have the following tests or measurements: Height, weight, and BMI. Blood pressure. Lipid and cholesterol levels. These may be checked every 5 years, or more frequently if you are over 14 years old. Skin check. Lung cancer screening. You may have this screening every year starting at age 66 if you have a 30-pack-year history of smoking and currently smoke or have quit within the past 15 years. Fecal occult blood test (FOBT) of the stool. You may have this test every year starting at age 34. Flexible sigmoidoscopy or colonoscopy. You may have a sigmoidoscopy every 5 years or a colonoscopy every 10 years starting at age 62. Prostate cancer screening. Recommendations will vary depending on your family history and other risks. Hepatitis C blood test. Hepatitis B blood test. Sexually transmitted disease (STD) testing. Diabetes screening. This is done by checking your blood sugar (glucose) after you have not eaten for a while (fasting). You may have this done every 1-3 years. Abdominal aortic aneurysm (AAA) screening. You may need this if you are a current or former  smoker. Osteoporosis. You may be screened starting at age 53 if you are at high risk. Talk with your health care provider about your test results, treatment options, and if necessary, the need for more tests. Vaccines  Your health care provider may recommend certain vaccines, such as: Influenza vaccine. This is recommended every year. Tetanus, diphtheria, and acellular pertussis  (Tdap, Td) vaccine. You may need a Td booster every 10 years. Zoster vaccine. You may need this after age 61. Pneumococcal 13-valent conjugate (PCV13) vaccine. One dose is recommended after age 79. Pneumococcal polysaccharide (PPSV23) vaccine. One dose is recommended after age 69. Talk to your health care provider about which screenings and vaccines you need and how often you need them. This information is not intended to replace advice given to you by your health care provider. Make sure you discuss any questions you have with your health care provider. Document Released: 05/10/2015 Document Revised: 01/01/2016 Document Reviewed: 02/12/2015 Elsevier Interactive Patient Education  2017 [employer] Prevention in the Home Falls can cause injuries. They can happen to people of all ages. There are many things you can do to make your home safe and to help prevent falls. What can I do on the outside of my home? Regularly fix the edges of walkways and driveways and fix any cracks. Remove anything that might make you trip as you walk through a door, such as a raised step or threshold. Trim any bushes or trees on the path to your home. Use bright outdoor lighting. Clear any walking paths of anything that might make someone trip, such as rocks or tools. Regularly check to see if handrails are loose or broken. Make sure that both sides of any steps have handrails. Any raised decks and porches should have guardrails on the edges. Have any leaves, snow, or ice cleared regularly. Use sand or salt on walking paths during winter. Clean up any spills in your garage right away. This includes oil or grease spills. What can I do in the bathroom? Use night lights. Install grab bars by the toilet and in the tub and shower. Do not use towel bars as grab bars. Use non-skid mats or decals in the tub or shower. If you need to sit down in the shower, use a plastic, non-slip stool. Keep the floor dry. Clean  up any water that spills on the floor as soon as it happens. Remove soap buildup in the tub or shower regularly. Attach bath mats securely with double-sided non-slip rug tape. Do not have throw rugs and other things on the floor that can make you trip. What can I do in the bedroom? Use night lights. Make sure that you have a light by your bed that is easy to reach. Do not use any sheets or blankets that are too big for your bed. They should not hang down onto the floor. Have a firm chair that has side arms. You can use this for support while you get dressed. Do not have throw rugs and other things on the floor that can make you trip. What can I do in the kitchen? Clean up any spills right away. Avoid walking on wet floors. Keep items that you use a lot in easy-to-reach places. If you need to reach something above you, use a strong step stool that has a grab bar. Keep electrical cords out of the way. Do not use floor polish or wax that makes floors slippery. If you must use wax, use non-skid  floor wax. Do not have throw rugs and other things on the floor that can make you trip. What can I do with my stairs? Do not leave any items on the stairs. Make sure that there are handrails on both sides of the stairs and use them. Fix handrails that are broken or loose. Make sure that handrails are as long as the stairways. Check any carpeting to make sure that it is firmly attached to the stairs. Fix any carpet that is loose or worn. Avoid having throw rugs at the top or bottom of the stairs. If you do have throw rugs, attach them to the floor with carpet tape. Make sure that you have a light switch at the top of the stairs and the bottom of the stairs. If you do not have them, ask someone to add them for you. What else can I do to help prevent falls? Wear shoes that: Do not have high heels. Have rubber bottoms. Are comfortable and fit you well. Are closed at the toe. Do not wear sandals. If you  use a stepladder: Make sure that it is fully opened. Do not climb a closed stepladder. Make sure that both sides of the stepladder are locked into place. Ask someone to hold it for you, if possible. Clearly mark and make sure that you can see: Any grab bars or handrails. First and last steps. Where the edge of each step is. Use tools that help you move around (mobility aids) if they are needed. These include: Canes. Walkers. Scooters. Crutches. Turn on the lights when you go into a dark area. Replace any light bulbs as soon as they burn out. Set up your furniture so you have a clear path. Avoid moving your furniture around. If any of your floors are uneven, fix them. If there are any pets around you, be aware of where they are. Review your medicines with your doctor. Some medicines can make you feel dizzy. This can increase your chance of falling. Ask your doctor what other things that you can do to help prevent falls. This information is not intended to replace advice given to you by your health care provider. Make sure you discuss any questions you have with your health care provider. Document Released: 02/07/2009 Document Revised: 09/19/2015 Document Reviewed: 05/18/2014 Elsevier Interactive Patient Education  2017 [employer].

## 2022-05-25 ENCOUNTER — Ambulatory Visit
Admission: RE | Admit: 2022-05-25 | Discharge: 2022-05-25 | Disposition: A | Discharge status: Home / Self Care | Payer: Medicare HMO | Source: Ambulatory Visit | Attending: Neurosurgery | Admitting: Neurosurgery

## 2022-05-25 DIAGNOSIS — M4726 Other spondylosis with radiculopathy, lumbar region: Secondary | ICD-10-CM

## 2022-05-25 DIAGNOSIS — M4807 Spinal stenosis, lumbosacral region: Secondary | ICD-10-CM | POA: Diagnosis not present

## 2022-05-25 DIAGNOSIS — M545 Low back pain, unspecified: Secondary | ICD-10-CM | POA: Diagnosis not present

## 2022-05-25 DIAGNOSIS — M4316 Spondylolisthesis, lumbar region: Secondary | ICD-10-CM

## 2022-05-25 MED ORDER — GADOPICLENOL 0.5 MMOL/ML IV SOLN
10.0000 mL | Freq: Once | INTRAVENOUS | Status: AC | PRN
  Administered 2022-05-25: 10 mL via INTRAVENOUS

## 2022-05-28 DIAGNOSIS — M4726 Other spondylosis with radiculopathy, lumbar region: Secondary | ICD-10-CM | POA: Diagnosis not present

## 2022-05-28 DIAGNOSIS — M4316 Spondylolisthesis, lumbar region: Secondary | ICD-10-CM | POA: Diagnosis not present

## 2022-05-28 DIAGNOSIS — Z6838 Body mass index (BMI) 38.0-38.9, adult: Secondary | ICD-10-CM | POA: Diagnosis not present

## 2022-06-03 DIAGNOSIS — M545 Low back pain, unspecified: Secondary | ICD-10-CM | POA: Diagnosis not present

## 2022-06-03 DIAGNOSIS — G894 Chronic pain syndrome: Secondary | ICD-10-CM | POA: Diagnosis not present

## 2022-06-03 DIAGNOSIS — M961 Postlaminectomy syndrome, not elsewhere classified: Secondary | ICD-10-CM | POA: Diagnosis not present

## 2022-06-03 DIAGNOSIS — Z79891 Long term (current) use of opiate analgesic: Secondary | ICD-10-CM | POA: Diagnosis not present

## 2022-06-08 ENCOUNTER — Ambulatory Visit (INDEPENDENT_AMBULATORY_CARE_PROVIDER_SITE_OTHER): Payer: Medicare HMO | Admitting: Family Medicine

## 2022-06-08 ENCOUNTER — Encounter: Payer: Self-pay | Admitting: Family Medicine

## 2022-06-08 VITALS — BP 136/86 | HR 67 | Temp 98.1°F | Resp 16 | Ht 68.0 in | Wt 254.8 lb

## 2022-06-08 DIAGNOSIS — Z130 Encounter for screening for diseases of the blood and blood-forming organs and certain disorders involving the immune mechanism: Secondary | ICD-10-CM

## 2022-06-08 DIAGNOSIS — Z125 Encounter for screening for malignant neoplasm of prostate: Secondary | ICD-10-CM | POA: Diagnosis not present

## 2022-06-08 DIAGNOSIS — Z23 Encounter for immunization: Secondary | ICD-10-CM

## 2022-06-08 DIAGNOSIS — Z0000 Encounter for general adult medical examination without abnormal findings: Secondary | ICD-10-CM

## 2022-06-08 DIAGNOSIS — Z122 Encounter for screening for malignant neoplasm of respiratory organs: Secondary | ICD-10-CM

## 2022-06-08 DIAGNOSIS — Z1159 Encounter for screening for other viral diseases: Secondary | ICD-10-CM

## 2022-06-08 DIAGNOSIS — Z981 Arthrodesis status: Secondary | ICD-10-CM | POA: Insufficient documentation

## 2022-06-08 DIAGNOSIS — G4733 Obstructive sleep apnea (adult) (pediatric): Secondary | ICD-10-CM | POA: Diagnosis not present

## 2022-06-08 DIAGNOSIS — Z13220 Encounter for screening for lipoid disorders: Secondary | ICD-10-CM

## 2022-06-08 MED ORDER — FLUOXETINE HCL 20 MG PO CAPS: 20.0000 mg | ORAL_CAPSULE | Freq: Every day | ORAL | 1 refills | Status: DC

## 2022-06-08 NOTE — Progress Notes (Unsigned)
Patient is her for complete physical examination  Care gaps gone over with patient  No other concerns today

## 2022-06-08 NOTE — Progress Notes (Unsigned)
New Patient Office Visit  Subjective    Patient ID: Evan [name], male    DOB: 09/08/1955  Age: 67 y.o. MRN: [id number]  CC:  Chief Complaint  Patient presents with   Annual Exam    HPI Evan [name] presents to establish care ***  Outpatient Encounter Medications as of 06/08/2022  Medication Sig   albuterol (VENTOLIN HFA) 108 (90 Base) MCG/ACT inhaler Inhale 1-2 puffs into the lungs every 6 (six) hours as needed for wheezing or shortness of breath.   ALPRAZolam (XANAX) 0.5 MG tablet Take 0.5 mg by mouth at bedtime as needed for anxiety.    amLODipine (NORVASC) 10 MG tablet Take 1 tablet (10 mg total) by mouth daily.   aspirin EC 81 MG tablet Take 81 mg by mouth daily.   Budesonide-Formoterol Fumarate (SYMBICORT IN) Inhale 10.2 mcg into the lungs daily at 12 noon.   ergocalciferol (VITAMIN D2) 1.25 MG (50000 UT) capsule Take 50,000 Units by mouth once a week. Monday   fexofenadine (ALLEGRA) 180 MG tablet Take 180 mg by mouth daily as needed for allergies.   FLUoxetine (PROZAC) 20 MG capsule Take 1 capsule (20 mg total) by mouth daily.   gabapentin (NEURONTIN) 600 MG tablet Take 600 mg by mouth 2 (two) times daily.   HYDROcodone-acetaminophen (NORCO) 7.5-325 MG tablet Take 1 tablet by mouth every 6 (six) hours as needed for moderate pain or severe pain.    losartan (COZAAR) 25 MG tablet Take 1 tablet (25 mg total) by mouth at bedtime.   omeprazole (PRILOSEC) 40 MG capsule Take 1 capsule (40 mg total) by mouth daily.   rosuvastatin (CRESTOR) 20 MG tablet Take 1 tablet (20 mg total) by mouth daily.   SYMBICORT 160-4.5 MCG/ACT inhaler Inhale 2 puffs into the lungs 2 (two) times daily.   tamsulosin (FLOMAX) 0.4 MG CAPS capsule Take 1 capsule (0.4 mg total) by mouth daily.   tiZANidine (ZANAFLEX) 4 MG tablet Take 1 tablet (4 mg total) by mouth every 6 (six) hours as needed for muscle spasms.   No facility-administered encounter medications on file as of 06/08/2022.     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   Osteoporosis    Right-sided low back pain with left-sided sciatica    Seizures (HCC)    no seizures in many years   Shortness of breath dyspnea    with  exertion   Sleep apnea    last sleep study more than 5 years  uses CPAP even when napping    Past Surgical History:  Procedure Laterality Date   FRACTURE SURGERY     JOINT REPLACEMENT     kidney stones     Left leg     NECK SURGERY     4-5--6 and 7   SPINE SURGERY     TOTAL HIP ARTHROPLASTY Right 07/09/2015   Procedure: RIGHT TOTAL HIP ARTHROPLASTY ANTERIOR APPROACH;  Surgeon:  Mcarthur Rossetti, MD;  Location: [facility];  Service: Orthopedics;  Laterality: Right;    Family History  Problem Relation Age of Onset   Hepatitis Mother    Alcohol abuse Mother    Depression Mother    Alzheimer's disease Father    COPD Father    Drug abuse Brother     Social History   Socioeconomic History   Marital status: Divorced    Spouse name: Not on file   Number of children: 2   Years of education: 12   Highest education level: Not on file  Occupational History   Not on file  Tobacco Use   Smoking status: Former    Packs/day: 2.00    Years: 45.00    Total pack years: 90.00    Types: Cigarettes    Quit date: 12/15/2017    Years since quitting: 4.4   Smokeless tobacco: Never  Vaping Use   Vaping Use: Never used  Substance and Sexual Activity   Alcohol use: Yes    Alcohol/week: 1.0 - 2.0 standard drink of alcohol    Types: 1 - 2 Shots  of liquor per week    Comment: Between supper and bed time   Drug use: Yes    Types: Hydrocodone   Sexual activity: Yes    Birth control/protection: Post-menopausal  Other Topics Concern   Not on file  Social History Narrative   Patient is not working he is trying to get disability   Patient is single    Education 12 th grade    Right handed.   Caffeine three cups daily.            Social Determinants of Health   Financial Resource Strain: Low Risk  (05/20/2022)   Overall Financial Resource Strain (CARDIA)    Difficulty of Paying Living Expenses: Not hard at all  Food Insecurity: No Food Insecurity (05/20/2022)   Hunger Vital Sign    Worried About Running Out of Food in the Last Year: Never true    Ran Out of Food in the Last Year: Never true  Transportation Needs: No Transportation Needs (05/20/2022)   PRAPARE - Hydrologist (Medical): No    Lack of Transportation (Non-Medical): No  Physical Activity: Inactive (05/20/2022)   Exercise Vital Sign    Days of Exercise per Week: 0 days    Minutes of Exercise per Session: 0 min  Stress: No Stress Concern Present (05/20/2022)   [place]    Feeling of Stress : Only a little  Social Connections: Not on file  Intimate Partner Violence: Not on file    ROS      Objective    BP 136/86   Pulse 67   Temp 98.1 F (36.7 C) (Oral)   Resp 16   Ht 5' 8"$  (1.727 m)   Wt 254 lb 12.8 oz (115.6 kg)   SpO2 92%   BMI 38.74 kg/m   Physical Exam  {Labs (Optional):23779}    Assessment & Plan:   Problem List Items Addressed This Visit   None Visit Diagnoses     Annual physical exam    -  Primary       No follow-ups on file.   Becky Sax, MD

## 2022-06-09 ENCOUNTER — Encounter: Payer: Self-pay | Admitting: Family Medicine

## 2022-06-09 LAB — CMP14+EGFR
ALT: 24 IU/L (ref 0–44)
AST: 22 IU/L (ref 0–40)
Albumin/Globulin Ratio: 2 (ref 1.2–2.2)
Albumin: 4.7 g/dL (ref 3.9–4.9)
Alkaline Phosphatase: 83 IU/L (ref 44–121)
BUN/Creatinine Ratio: 16 (ref 10–24)
BUN: 17 mg/dL (ref 8–27)
Bilirubin Total: 0.3 mg/dL (ref 0.0–1.2)
CO2: 17 mmol/L — ABNORMAL LOW (ref 20–29)
Calcium: 9.3 mg/dL (ref 8.6–10.2)
Chloride: 104 mmol/L (ref 96–106)
Creatinine, Ser: 1.05 mg/dL (ref 0.76–1.27)
Globulin, Total: 2.4 g/dL (ref 1.5–4.5)
Glucose: 104 mg/dL — ABNORMAL HIGH (ref 70–99)
Potassium: 4.2 mmol/L (ref 3.5–5.2)
Sodium: 142 mmol/L (ref 134–144)
Total Protein: 7.1 g/dL (ref 6.0–8.5)
eGFR: 77 mL/min/{1.73_m2} (ref >59)

## 2022-06-10 LAB — PSA: Prostate Specific Ag, Serum: 0.9 ng/mL (ref 0.0–4.0)

## 2022-06-10 LAB — SPECIMEN STATUS REPORT

## 2022-06-30 ENCOUNTER — Other Ambulatory Visit: Payer: Self-pay | Admitting: Neurosurgery

## 2022-07-02 DIAGNOSIS — Z79891 Long term (current) use of opiate analgesic: Secondary | ICD-10-CM | POA: Diagnosis not present

## 2022-07-02 DIAGNOSIS — M545 Low back pain, unspecified: Secondary | ICD-10-CM | POA: Diagnosis not present

## 2022-07-02 DIAGNOSIS — G894 Chronic pain syndrome: Secondary | ICD-10-CM | POA: Diagnosis not present

## 2022-07-02 DIAGNOSIS — M961 Postlaminectomy syndrome, not elsewhere classified: Secondary | ICD-10-CM | POA: Diagnosis not present

## 2022-07-07 DIAGNOSIS — G4733 Obstructive sleep apnea (adult) (pediatric): Secondary | ICD-10-CM | POA: Diagnosis not present

## 2022-07-09 NOTE — Pre-Procedure Instructions (Signed)
Surgical Instructions    Your procedure is scheduled on Wednesday, March 20.  Report to [facility] Main Entrance "A" at 6:30 A.M., then check in with the Admitting office.  Call this number if you have problems the morning of surgery:  [phone]   If you have any questions prior to your surgery date call [phone]: Open Monday-Friday 8am-4pm If you experience any cold or flu symptoms such as cough, fever, chills, shortness of breath, etc. between now and your scheduled surgery, please notify us at the above number     Remember:  Do not eat after midnight the night before your surgery  You may drink clear liquids until 5:30AM the morning of your surgery.   Clear liquids allowed are: Water, Non-Citrus Juices (without pulp), Carbonated Beverages, Clear Tea, Black Coffee ONLY (NO MILK, CREAM OR POWDERED CREAMER of any kind), and Gatorade    Take these medicines the morning of surgery with A SIP OF WATER:  amLODipine (NORVASC)  FLUoxetine (PROZAC)  gabapentin (NEURONTIN)  omeprazole (PRILOSEC)  rosuvastatin (CRESTOR)  tamsulosin (FLOMAX)   If Needed: albuterol (VENTOLIN HFA) 108 (90 Base) MCG/ACT inhaler  fexofenadine (ALLEGRA)  HYDROcodone-acetaminophen (NORCO)  SYMBICORT  tiZANidine (ZANAFLEX)   Please bring all inhalers with you the day of surgery.    Follow your surgeon's instructions on when to stop Aspirin.  If no instructions were given by your surgeon then you will need to call the office to get those instructions.    As of today, STOP taking any Aleve, Naproxen, Ibuprofen, Motrin, Advil, Goody's, BC's, all herbal medications, fish oil, and all vitamins.    [facility] is not responsible for any belongings or valuables.    Do NOT Smoke (Tobacco/Vaping)  24 hours prior to your procedure  If you use a CPAP at night, you may bring your mask for your overnight stay.   Contacts, glasses, hearing aids, dentures or partials may not be worn into surgery, please bring  cases for these belongings   For patients admitted to the hospital, discharge time will be determined by your treatment team.   Patients discharged the day of surgery will not be allowed to drive home, and someone needs to stay with them for 24 hours.   SURGICAL WAITING ROOM VISITATION Patients having surgery or a procedure may have no more than 2 support people in the waiting area - these visitors may rotate.   Children under the age of 6 must have an adult with them who is not the patient. If the patient needs to stay at the hospital during part of their recovery, the visitor guidelines for inpatient rooms apply. Pre-op nurse will coordinate an appropriate time for 1 support person to accompany patient in pre-op.  This support person may not rotate.   Please refer to [email or web address] for the visitor guidelines for Inpatients (after your surgery is over and you are in a regular room).    Special instructions:    Oral Hygiene is also important to reduce your risk of infection.  Remember - BRUSH YOUR TEETH THE MORNING OF SURGERY WITH YOUR REGULAR TOOTHPASTE   [facility]- Preparing For Surgery  Before surgery, you can play an important role. Because skin is not sterile, your skin needs to be as free of germs as possible. You can reduce the number of germs on your skin by washing with CHG (chlorahexidine gluconate) Soap before surgery.  CHG is an antiseptic cleaner which kills germs and bonds with the skin to continue killing  germs even after washing.     Please do not use if you have an allergy to CHG or antibacterial soaps. If your skin becomes reddened/irritated stop using the CHG.  Do not shave (including legs and underarms) for at least 48 hours prior to first CHG shower. It is OK to shave your face.  Please follow these instructions carefully.     Shower the NIGHT BEFORE SURGERY and the MORNING OF SURGERY with CHG Soap.   If  you chose to wash your hair, wash your hair first as usual with your normal shampoo. After you shampoo, rinse your hair and body thoroughly to remove the shampoo.  Then [employer] and genitals (private parts) with your normal soap and rinse thoroughly to remove soap.  After that Use CHG Soap as you would any other liquid soap. You can apply CHG directly to the skin and wash gently with a scrungie or a clean washcloth.   Apply the CHG Soap to your body ONLY FROM THE NECK DOWN.  Do not use on open wounds or open sores. Avoid contact with your eyes, ears, mouth and genitals (private parts). Wash Face and genitals (private parts)  with your normal soap.   Wash thoroughly, paying special attention to the area where your surgery will be performed.  Thoroughly rinse your body with warm water from the neck down.  DO NOT shower/wash with your normal soap after using and rinsing off the CHG Soap.  Pat yourself dry with a CLEAN TOWEL.  Wear CLEAN PAJAMAS to bed the night before surgery  Place CLEAN SHEETS on your bed the night before your surgery  DO NOT SLEEP WITH PETS.   Day of Surgery:  Take a shower with CHG soap. Wear Clean/Comfortable clothing the morning of surgery Do not wear jewelry or makeup. Do not wear lotions, powders, perfumes/cologne or deodorant. Do not shave 48 hours prior to surgery.  Men may shave face and neck. Do not bring valuables to the hospital. Do not wear nail polish, gel polish, artificial nails, or any other type of covering on natural nails (fingers and toes) If you have artificial nails or gel coating that need to be removed by a nail salon, please have this removed prior to surgery. Artificial nails or gel coating may interfere with anesthesia's ability to adequately monitor your vital signs. Remember to brush your teeth WITH YOUR REGULAR TOOTHPASTE.    If you received a COVID test during your pre-op visit, it is requested that you wear a mask when out in  public, stay away from anyone that may not be feeling well, and notify your surgeon if you develop symptoms. If you have been in contact with anyone that has tested positive in the last 10 days, please notify your surgeon.    Please read over the following fact sheets that you were given.

## 2022-07-10 ENCOUNTER — Other Ambulatory Visit: Payer: Self-pay

## 2022-07-10 ENCOUNTER — Encounter (HOSPITAL_COMMUNITY)
Admission: RE | Admit: 2022-07-10 | Discharge: 2022-07-10 | Disposition: A | Discharge status: Home / Self Care | Payer: Medicare HMO | Source: Ambulatory Visit | Attending: Neurosurgery | Admitting: Neurosurgery

## 2022-07-10 ENCOUNTER — Encounter (HOSPITAL_COMMUNITY): Payer: Self-pay

## 2022-07-10 VITALS — BP 143/87 | HR 68 | Temp 97.7°F | Resp 18 | Ht 68.0 in | Wt 254.8 lb

## 2022-07-10 DIAGNOSIS — Z87891 Personal history of nicotine dependence: Secondary | ICD-10-CM | POA: Insufficient documentation

## 2022-07-10 DIAGNOSIS — I2510 Atherosclerotic heart disease of native coronary artery without angina pectoris: Secondary | ICD-10-CM | POA: Insufficient documentation

## 2022-07-10 DIAGNOSIS — Z01818 Encounter for other preprocedural examination: Secondary | ICD-10-CM

## 2022-07-10 DIAGNOSIS — J449 Chronic obstructive pulmonary disease, unspecified: Secondary | ICD-10-CM | POA: Insufficient documentation

## 2022-07-10 DIAGNOSIS — I10 Essential (primary) hypertension: Secondary | ICD-10-CM | POA: Insufficient documentation

## 2022-07-10 DIAGNOSIS — Z01812 Encounter for preprocedural laboratory examination: Secondary | ICD-10-CM | POA: Diagnosis not present

## 2022-07-10 DIAGNOSIS — G4733 Obstructive sleep apnea (adult) (pediatric): Secondary | ICD-10-CM | POA: Diagnosis not present

## 2022-07-10 LAB — CBC
HCT: 46.9 % (ref 39.0–52.0)
Hemoglobin: 14.5 g/dL (ref 13.0–17.0)
MCH: 27.2 pg (ref 26.0–34.0)
MCHC: 30.9 g/dL (ref 30.0–36.0)
MCV: 87.8 fL (ref 80.0–100.0)
Platelets: 216 10*3/uL (ref 150–400)
RBC: 5.34 MIL/uL (ref 4.22–5.81)
RDW: 13.6 % (ref 11.5–15.5)
WBC: 7.3 10*3/uL (ref 4.0–10.5)
nRBC: 0 % (ref 0.0–0.2)

## 2022-07-10 LAB — BASIC METABOLIC PANEL
Anion gap: 8 (ref 5–15)
BUN: 14 mg/dL (ref 8–23)
CO2: 27 mmol/L (ref 22–32)
Calcium: 9.5 mg/dL (ref 8.9–10.3)
Chloride: 104 mmol/L (ref 98–111)
Creatinine, Ser: 1.08 mg/dL (ref 0.61–1.24)
GFR, Estimated: >60 mL/min (ref >60)
Glucose, Bld: 121 mg/dL — ABNORMAL HIGH (ref 70–99)
Potassium: 4.3 mmol/L (ref 3.5–5.1)
Sodium: 139 mmol/L (ref 135–145)

## 2022-07-10 LAB — TYPE AND SCREEN
ABO/RH(D): B POS
Antibody Screen: NEGATIVE

## 2022-07-10 LAB — SURGICAL PCR SCREEN
MRSA, PCR: NEGATIVE
Staphylococcus aureus: NEGATIVE

## 2022-07-10 NOTE — Progress Notes (Signed)
PCP - Dorna Mai Cardiologist - Daneen Schick   PPM/ICD - Denies  Chest x-Elza - NI EKG - 11/13/21 Stress Test - "long time ago" ECHO - 02/10/18 Cardiac Cath - Denies  Sleep Study - OSA uses CPAP nightly  DM - Denies  Blood Thinner Instructions: n/a Aspirin Instructions:Per patient has already stopped  ERAS Protcol - Yes  COVID TEST- N/I   Anesthesia review: no  Patient denies shortness of breath, fever, cough and chest pain at PAT appointment  Patient denies any respiratory infection, PNA, cold, or flu in the past 2 months.   All instructions explained to the patient, with a verbal understanding of the material. Patient agrees to go over the instructions while at home for a better understanding.  The opportunity to ask questions was provided.

## 2022-07-13 NOTE — Anesthesia Preprocedure Evaluation (Signed)
Anesthesia Evaluation  Patient identified by MRN, date of birth, ID band Patient awake    Reviewed: Allergy & Precautions, NPO status , Patient's Chart, lab work & pertinent test results  Airway Mallampati: II  TM Distance: >3 FB Neck ROM: Full    Dental no notable dental hx.    Pulmonary asthma , sleep apnea and Continuous Positive Airway Pressure Ventilation , COPD,  COPD inhaler, former smoker   Pulmonary exam normal        Cardiovascular hypertension, Pt. on medications + CAD and +CHF   Rhythm:Regular Rate:Normal     Neuro/Psych Seizures -, Well Controlled,   Anxiety Depression       GI/Hepatic Neg liver ROS,GERD  Medicated,,  Endo/Other  negative endocrine ROS    Renal/GU negative Renal ROS  negative genitourinary   Musculoskeletal  (+) Arthritis , Osteoarthritis,    Abdominal Normal abdominal exam  (+)   Peds  Hematology negative hematology ROS (+)   Anesthesia Other Findings   Reproductive/Obstetrics                             Anesthesia Physical Anesthesia Plan  ASA: 3  Anesthesia Plan: General   Post-op Pain Management: Celebrex PO (pre-op)* and Tylenol PO (pre-op)*   Induction: Intravenous  PONV Risk Score and Plan: 2 and Ondansetron, Dexamethasone, Midazolam and Treatment may vary due to age or medical condition  Airway Management Planned: Mask and Oral ETT  Additional Equipment: None  Intra-op Plan:   Post-operative Plan: Extubation in OR  Informed Consent: I have reviewed the patients History and Physical, chart, labs and discussed the procedure including the risks, benefits and alternatives for the proposed anesthesia with the patient or authorized representative who has indicated his/her understanding and acceptance.     Dental advisory given  Plan Discussed with: CRNA  Anesthesia Plan Comments: (PAT note by Karoline Caldwell, PA-C: Previously followed with  cardiology for hx of DOE and coronary calcifications on CT. Echo  2019 showed normal biventricular function.  Pt was seen by Dr. Tamala Julian 10/17/18 and at that time Coronary CTA with FFR was ordered to exclude ischemia as cause for his DOE. Scan done 11/10/18 showed minimal non-obstructive CAD. Risk factor modification was recommended.  Last seen by Dr. Tamala Julian 11/13/2021 and was recommended continue with PCP for risk factor modification and follow-up with cardiology on an as-needed basis.  Follows with pulmonology for OSA on CPAP and COPD (former smoker, 90 pack years, quit 2019). Per notes, his DOE felt likely more related to obesity/deconditioning rather than airflow limitation.    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  Preop labs reviewed, unremarkable.   EKG 10/03/18: Sinus rhythm, Rate 71.  Coronary CTA with FFR 11/10/18: IMPRESSION: 1. Coronary calcium score of 42. This was 71 percentile for age and sex matched control.  2. Normal coronary origin with right dominance.  3. Minimal non-obstructive CAD. CAD RADS 1. Risk factor modification is recommended  TTE 02/10/18: - Left ventricle: The cavity size was normal. There was moderate  concentric hypertrophy. Systolic function was normal. The  estimated ejection fraction was in the range of 55% to 60%.  Although no diagnostic regional wall motion abnormality was  identified, this possibility cannot be completely excluded on the  basis of this study. Doppler parameters are consistent with  abnormal left ventricular relaxation (grade 1 diastolic  dysfunction).  - Left atrium: The atrium was mildly dilated.    )  Anesthesia Quick Evaluation  

## 2022-07-13 NOTE — Progress Notes (Signed)
Anesthesia Chart Review:  Previously followed with cardiology for hx of DOE and coronary calcifications on CT. Echo  2019 showed normal biventricular function.  Pt was seen by Dr. Tamala Julian 10/17/18 and at that time Coronary CTA with FFR was ordered to exclude ischemia as cause for his DOE. Scan done 11/10/18 showed minimal non-obstructive CAD. Risk factor modification was recommended.  Last seen by Dr. Tamala Julian 11/13/2021 and was recommended continue with PCP for risk factor modification and follow-up with cardiology on an as-needed basis.   Follows with pulmonology for OSA on CPAP and COPD (former smoker, 90 pack years, quit 2019). Per notes, his DOE felt likely more related to obesity/deconditioning rather than airflow limitation.    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   Preop labs reviewed, unremarkable.    EKG 10/03/18: Sinus rhythm, Rate 71.   Coronary CTA with FFR 11/10/18: IMPRESSION: 1. Coronary calcium score of 42. This was 83 percentile for age and sex matched control.   2. Normal coronary origin with right dominance.   3. Minimal non-obstructive CAD. CAD RADS 1. Risk factor modification is recommended   TTE 02/10/18: - Left ventricle: The cavity size was normal. There was moderate    concentric hypertrophy. Systolic function was normal. The    estimated ejection fraction was in the range of 55% to 60%.    Although no diagnostic regional wall motion abnormality was    identified, this possibility cannot be completely excluded on the    basis of this study. Doppler parameters are consistent with    abnormal left ventricular relaxation (grade 1 diastolic    dysfunction).  - Left atrium: The atrium was mildly dilated.       Wynonia Musty [facility] Short Stay Center/Anesthesiology Phone [phone] 07/13/2022 11:45 AM

## 2022-07-15 ENCOUNTER — Ambulatory Visit (HOSPITAL_COMMUNITY): Payer: Medicare HMO

## 2022-07-15 ENCOUNTER — Ambulatory Visit (HOSPITAL_COMMUNITY): Payer: Medicare HMO | Admitting: Physician Assistant

## 2022-07-15 ENCOUNTER — Other Ambulatory Visit: Payer: Self-pay

## 2022-07-15 ENCOUNTER — Ambulatory Visit (HOSPITAL_BASED_OUTPATIENT_CLINIC_OR_DEPARTMENT_OTHER): Payer: Medicare HMO

## 2022-07-15 ENCOUNTER — Encounter (HOSPITAL_COMMUNITY): Payer: Self-pay | Admitting: Neurosurgery

## 2022-07-15 ENCOUNTER — Encounter (HOSPITAL_COMMUNITY)
Admission: RE | Disposition: A | Discharge status: Home / Self Care | Payer: Self-pay | Source: Home / Self Care | Attending: Neurosurgery

## 2022-07-15 ENCOUNTER — Observation Stay (HOSPITAL_COMMUNITY)
Admission: RE | Admit: 2022-07-15 | Discharge: 2022-07-16 | Disposition: A | Discharge status: Home / Self Care | Payer: Medicare HMO | Attending: Neurosurgery | Admitting: Neurosurgery

## 2022-07-15 DIAGNOSIS — I509 Heart failure, unspecified: Secondary | ICD-10-CM | POA: Insufficient documentation

## 2022-07-15 DIAGNOSIS — S32009K Unspecified fracture of unspecified lumbar vertebra, subsequent encounter for fracture with nonunion: Secondary | ICD-10-CM | POA: Diagnosis present

## 2022-07-15 DIAGNOSIS — G4730 Sleep apnea, unspecified: Secondary | ICD-10-CM | POA: Diagnosis not present

## 2022-07-15 DIAGNOSIS — M4317 Spondylolisthesis, lumbosacral region: Secondary | ICD-10-CM

## 2022-07-15 DIAGNOSIS — R2689 Other abnormalities of gait and mobility: Secondary | ICD-10-CM | POA: Diagnosis not present

## 2022-07-15 DIAGNOSIS — R569 Unspecified convulsions: Secondary | ICD-10-CM | POA: Diagnosis not present

## 2022-07-15 DIAGNOSIS — F419 Anxiety disorder, unspecified: Secondary | ICD-10-CM | POA: Insufficient documentation

## 2022-07-15 DIAGNOSIS — Z981 Arthrodesis status: Secondary | ICD-10-CM | POA: Diagnosis not present

## 2022-07-15 DIAGNOSIS — M6281 Muscle weakness (generalized): Secondary | ICD-10-CM | POA: Diagnosis not present

## 2022-07-15 DIAGNOSIS — K219 Gastro-esophageal reflux disease without esophagitis: Secondary | ICD-10-CM | POA: Insufficient documentation

## 2022-07-15 DIAGNOSIS — Z87891 Personal history of nicotine dependence: Secondary | ICD-10-CM | POA: Diagnosis not present

## 2022-07-15 DIAGNOSIS — M960 Pseudarthrosis after fusion or arthrodesis: Secondary | ICD-10-CM | POA: Diagnosis not present

## 2022-07-15 DIAGNOSIS — Z9989 Dependence on other enabling machines and devices: Secondary | ICD-10-CM

## 2022-07-15 DIAGNOSIS — G4733 Obstructive sleep apnea (adult) (pediatric): Secondary | ICD-10-CM

## 2022-07-15 DIAGNOSIS — F32A Depression, unspecified: Secondary | ICD-10-CM | POA: Insufficient documentation

## 2022-07-15 DIAGNOSIS — I110 Hypertensive heart disease with heart failure: Secondary | ICD-10-CM | POA: Diagnosis not present

## 2022-07-15 DIAGNOSIS — I2510 Atherosclerotic heart disease of native coronary artery without angina pectoris: Secondary | ICD-10-CM | POA: Insufficient documentation

## 2022-07-15 DIAGNOSIS — R69 Illness, unspecified: Secondary | ICD-10-CM | POA: Diagnosis not present

## 2022-07-15 DIAGNOSIS — J449 Chronic obstructive pulmonary disease, unspecified: Secondary | ICD-10-CM | POA: Insufficient documentation

## 2022-07-15 DIAGNOSIS — R2681 Unsteadiness on feet: Secondary | ICD-10-CM | POA: Insufficient documentation

## 2022-07-15 DIAGNOSIS — Z4789 Encounter for other orthopedic aftercare: Secondary | ICD-10-CM | POA: Diagnosis not present

## 2022-07-15 DIAGNOSIS — R262 Difficulty in walking, not elsewhere classified: Secondary | ICD-10-CM | POA: Diagnosis not present

## 2022-07-15 DIAGNOSIS — M4316 Spondylolisthesis, lumbar region: Secondary | ICD-10-CM | POA: Diagnosis not present

## 2022-07-15 LAB — CBC
HCT: 41.7 % (ref 39.0–52.0)
Hemoglobin: 13.2 g/dL (ref 13.0–17.0)
MCH: 27.2 pg (ref 26.0–34.0)
MCHC: 31.7 g/dL (ref 30.0–36.0)
MCV: 85.8 fL (ref 80.0–100.0)
Platelets: 193 10*3/uL (ref 150–400)
RBC: 4.86 MIL/uL (ref 4.22–5.81)
RDW: 13.6 % (ref 11.5–15.5)
WBC: 13 10*3/uL — ABNORMAL HIGH (ref 4.0–10.5)
nRBC: 0 % (ref 0.0–0.2)

## 2022-07-15 LAB — CREATININE, SERUM
Creatinine, Ser: 1.3 mg/dL — ABNORMAL HIGH (ref 0.61–1.24)
GFR, Estimated: 60 mL/min — ABNORMAL LOW (ref >60)

## 2022-07-15 SURGERY — POSTERIOR LUMBAR FUSION 1 LEVEL
Anesthesia: General

## 2022-07-15 MED ORDER — DIAZEPAM 5 MG PO TABS
5.0000 mg | ORAL_TABLET | Freq: Four times a day (QID) | ORAL | Status: DC | PRN
  Administered 2022-07-15: 5 mg via ORAL
  Filled 2022-07-15: qty 1

## 2022-07-15 MED ORDER — POTASSIUM CHLORIDE IN NACL 20-0.9 MEQ/L-% IV SOLN: INTRAVENOUS | Status: DC

## 2022-07-15 MED ORDER — ONDANSETRON HCL 4 MG/2ML IJ SOLN: 4.0000 mg | Freq: Four times a day (QID) | INTRAMUSCULAR | Status: DC | PRN

## 2022-07-15 MED ORDER — FENTANYL CITRATE (PF) 100 MCG/2ML IJ SOLN
INTRAMUSCULAR | Status: AC
  Filled 2022-07-15: qty 2

## 2022-07-15 MED ORDER — SODIUM CHLORIDE 0.9% FLUSH: 3.0000 mL | INTRAVENOUS | Status: DC | PRN

## 2022-07-15 MED ORDER — CELECOXIB 200 MG PO CAPS
200.0000 mg | ORAL_CAPSULE | Freq: Once | ORAL | Status: AC
  Administered 2022-07-15: 200 mg via ORAL
  Filled 2022-07-15: qty 1

## 2022-07-15 MED ORDER — MIDAZOLAM HCL 2 MG/2ML IJ SOLN
INTRAMUSCULAR | Status: AC
  Filled 2022-07-15: qty 2

## 2022-07-15 MED ORDER — SENNOSIDES-DOCUSATE SODIUM 8.6-50 MG PO TABS: 1.0000 | ORAL_TABLET | Freq: Every evening | ORAL | Status: DC | PRN

## 2022-07-15 MED ORDER — GABAPENTIN 300 MG PO CAPS
600.0000 mg | ORAL_CAPSULE | Freq: Two times a day (BID) | ORAL | Status: DC
  Administered 2022-07-15: 600 mg via ORAL
  Filled 2022-07-15: qty 2

## 2022-07-15 MED ORDER — ORAL CARE MOUTH RINSE: 15.0000 mL | Freq: Once | OROMUCOSAL | Status: DC

## 2022-07-15 MED ORDER — PROPOFOL 10 MG/ML IV BOLUS
INTRAVENOUS | Status: DC | PRN
  Administered 2022-07-15: 200 mg via INTRAVENOUS

## 2022-07-15 MED ORDER — CHLORHEXIDINE GLUCONATE CLOTH 2 % EX PADS: 6.0000 | MEDICATED_PAD | Freq: Once | CUTANEOUS | Status: DC

## 2022-07-15 MED ORDER — HEPARIN SODIUM (PORCINE) 5000 UNIT/ML IJ SOLN
5000.0000 [IU] | Freq: Three times a day (TID) | INTRAMUSCULAR | Status: DC
  Administered 2022-07-15 – 2022-07-16 (×2): 5000 [IU] via SUBCUTANEOUS
  Filled 2022-07-15 (×2): qty 1

## 2022-07-15 MED ORDER — LIDOCAINE-EPINEPHRINE 0.5 %-1:200000 IJ SOLN
INTRAMUSCULAR | Status: AC
  Filled 2022-07-15: qty 50

## 2022-07-15 MED ORDER — AMLODIPINE BESYLATE 10 MG PO TABS
10.0000 mg | ORAL_TABLET | Freq: Every day | ORAL | Status: DC
  Administered 2022-07-15: 10 mg via ORAL
  Filled 2022-07-15: qty 1

## 2022-07-15 MED ORDER — SUGAMMADEX SODIUM 200 MG/2ML IV SOLN
INTRAVENOUS | Status: DC | PRN
  Administered 2022-07-15: 300 mg via INTRAVENOUS

## 2022-07-15 MED ORDER — CHLORHEXIDINE GLUCONATE 0.12 % MT SOLN
15.0000 mL | Freq: Once | OROMUCOSAL | Status: DC
  Filled 2022-07-15: qty 15

## 2022-07-15 MED ORDER — PROPOFOL 10 MG/ML IV BOLUS
INTRAVENOUS | Status: AC
  Filled 2022-07-15: qty 20

## 2022-07-15 MED ORDER — MORPHINE SULFATE (PF) 2 MG/ML IV SOLN: 2.0000 mg | INTRAVENOUS | Status: DC | PRN

## 2022-07-15 MED ORDER — ACETAMINOPHEN 650 MG RE SUPP: 650.0000 mg | RECTAL | Status: DC | PRN

## 2022-07-15 MED ORDER — FENTANYL CITRATE (PF) 250 MCG/5ML IJ SOLN
INTRAMUSCULAR | Status: AC
  Filled 2022-07-15: qty 5

## 2022-07-15 MED ORDER — ROSUVASTATIN CALCIUM 20 MG PO TABS
20.0000 mg | ORAL_TABLET | Freq: Every day | ORAL | Status: DC
  Filled 2022-07-15 (×2): qty 1

## 2022-07-15 MED ORDER — OXYCODONE HCL 5 MG PO TABS
5.0000 mg | ORAL_TABLET | ORAL | Status: DC | PRN
  Administered 2022-07-16: 5 mg via ORAL
  Filled 2022-07-15: qty 1

## 2022-07-15 MED ORDER — ALBUTEROL SULFATE (2.5 MG/3ML) 0.083% IN NEBU: 3.0000 mL | INHALATION_SOLUTION | Freq: Four times a day (QID) | RESPIRATORY_TRACT | Status: DC | PRN

## 2022-07-15 MED ORDER — LORATADINE 10 MG PO TABS: 10.0000 mg | ORAL_TABLET | Freq: Every day | ORAL | Status: DC

## 2022-07-15 MED ORDER — ACETAMINOPHEN 325 MG PO TABS
650.0000 mg | ORAL_TABLET | ORAL | Status: DC | PRN
  Administered 2022-07-15 – 2022-07-16 (×3): 650 mg via ORAL
  Filled 2022-07-15 (×3): qty 2

## 2022-07-15 MED ORDER — THROMBIN 20000 UNITS EX SOLR: CUTANEOUS | Status: DC | PRN

## 2022-07-15 MED ORDER — FENTANYL CITRATE (PF) 100 MCG/2ML IJ SOLN
25.0000 ug | INTRAMUSCULAR | Status: DC | PRN
  Administered 2022-07-15 (×2): 50 ug via INTRAVENOUS

## 2022-07-15 MED ORDER — LIDOCAINE-EPINEPHRINE 0.5 %-1:200000 IJ SOLN
INTRAMUSCULAR | Status: DC | PRN
  Administered 2022-07-15: 9 mL

## 2022-07-15 MED ORDER — PANTOPRAZOLE SODIUM 40 MG PO TBEC
80.0000 mg | DELAYED_RELEASE_TABLET | Freq: Every day | ORAL | Status: DC
  Administered 2022-07-15: 80 mg via ORAL
  Filled 2022-07-15: qty 2

## 2022-07-15 MED ORDER — MOMETASONE FURO-FORMOTEROL FUM 200-5 MCG/ACT IN AERO
2.0000 | INHALATION_SPRAY | Freq: Two times a day (BID) | RESPIRATORY_TRACT | Status: DC
  Administered 2022-07-15 – 2022-07-16 (×2): 2 via RESPIRATORY_TRACT
  Filled 2022-07-15: qty 8.8

## 2022-07-15 MED ORDER — SENNA 8.6 MG PO TABS
1.0000 | ORAL_TABLET | Freq: Two times a day (BID) | ORAL | Status: DC
  Administered 2022-07-15: 8.6 mg via ORAL
  Filled 2022-07-15: qty 1

## 2022-07-15 MED ORDER — PHENYLEPHRINE HCL-NACL 20-0.9 MG/250ML-% IV SOLN
INTRAVENOUS | Status: DC | PRN
  Administered 2022-07-15: 20 ug/min via INTRAVENOUS

## 2022-07-15 MED ORDER — ONDANSETRON HCL 4 MG PO TABS: 4.0000 mg | ORAL_TABLET | Freq: Four times a day (QID) | ORAL | Status: DC | PRN

## 2022-07-15 MED ORDER — PHENOL 1.4 % MT LIQD: 1.0000 | OROMUCOSAL | Status: DC | PRN

## 2022-07-15 MED ORDER — OXYCODONE HCL 5 MG PO TABS
10.0000 mg | ORAL_TABLET | ORAL | Status: DC | PRN
  Administered 2022-07-15 – 2022-07-16 (×3): 10 mg via ORAL
  Filled 2022-07-15 (×3): qty 2

## 2022-07-15 MED ORDER — MENTHOL 3 MG MT LOZG: 1.0000 | LOZENGE | OROMUCOSAL | Status: DC | PRN

## 2022-07-15 MED ORDER — TAMSULOSIN HCL 0.4 MG PO CAPS
0.4000 mg | ORAL_CAPSULE | Freq: Every day | ORAL | Status: DC
  Administered 2022-07-15: 0.4 mg via ORAL
  Filled 2022-07-15: qty 1

## 2022-07-15 MED ORDER — ONDANSETRON HCL 4 MG/2ML IJ SOLN
INTRAMUSCULAR | Status: DC | PRN
  Administered 2022-07-15: 4 mg via INTRAVENOUS

## 2022-07-15 MED ORDER — BUPIVACAINE HCL (PF) 0.5 % IJ SOLN
INTRAMUSCULAR | Status: DC | PRN
  Administered 2022-07-15: 20 mL

## 2022-07-15 MED ORDER — CEFAZOLIN IN SODIUM CHLORIDE 3-0.9 GM/100ML-% IV SOLN
3.0000 g | INTRAVENOUS | Status: AC
  Administered 2022-07-15: 3 g via INTRAVENOUS
  Filled 2022-07-15: qty 100

## 2022-07-15 MED ORDER — FLUOXETINE HCL 20 MG PO CAPS
20.0000 mg | ORAL_CAPSULE | Freq: Every day | ORAL | Status: DC
  Administered 2022-07-15: 20 mg via ORAL
  Filled 2022-07-15: qty 1

## 2022-07-15 MED ORDER — ASPIRIN 81 MG PO TBEC: 81.0000 mg | DELAYED_RELEASE_TABLET | Freq: Every day | ORAL | Status: DC

## 2022-07-15 MED ORDER — DEXAMETHASONE SODIUM PHOSPHATE 10 MG/ML IJ SOLN
INTRAMUSCULAR | Status: DC | PRN
  Administered 2022-07-15: 10 mg via INTRAVENOUS

## 2022-07-15 MED ORDER — LIDOCAINE 2% (20 MG/ML) 5 ML SYRINGE
INTRAMUSCULAR | Status: DC | PRN
  Administered 2022-07-15: 100 mg via INTRAVENOUS

## 2022-07-15 MED ORDER — 0.9 % SODIUM CHLORIDE (POUR BTL) OPTIME
TOPICAL | Status: DC | PRN
  Administered 2022-07-15: 1000 mL

## 2022-07-15 MED ORDER — PHENYLEPHRINE 80 MCG/ML (10ML) SYRINGE FOR IV PUSH (FOR BLOOD PRESSURE SUPPORT)
PREFILLED_SYRINGE | INTRAVENOUS | Status: DC | PRN
  Administered 2022-07-15 (×3): 160 ug via INTRAVENOUS

## 2022-07-15 MED ORDER — LOSARTAN POTASSIUM 50 MG PO TABS
25.0000 mg | ORAL_TABLET | Freq: Every day | ORAL | Status: DC
  Administered 2022-07-15: 25 mg via ORAL
  Filled 2022-07-15: qty 1

## 2022-07-15 MED ORDER — LACTATED RINGERS IV SOLN: INTRAVENOUS | Status: DC

## 2022-07-15 MED ORDER — SODIUM CHLORIDE 0.9% FLUSH
3.0000 mL | Freq: Two times a day (BID) | INTRAVENOUS | Status: DC
  Administered 2022-07-15: 3 mL via INTRAVENOUS

## 2022-07-15 MED ORDER — ACETAMINOPHEN 500 MG PO TABS
1000.0000 mg | ORAL_TABLET | Freq: Once | ORAL | Status: AC
  Administered 2022-07-15: 1000 mg via ORAL
  Filled 2022-07-15: qty 2

## 2022-07-15 MED ORDER — BUPIVACAINE HCL (PF) 0.5 % IJ SOLN
INTRAMUSCULAR | Status: AC
  Filled 2022-07-15: qty 30

## 2022-07-15 MED ORDER — THROMBIN 20000 UNITS EX SOLR
CUTANEOUS | Status: AC
  Filled 2022-07-15: qty 20000

## 2022-07-15 MED ORDER — EPHEDRINE SULFATE-NACL 50-0.9 MG/10ML-% IV SOSY
PREFILLED_SYRINGE | INTRAVENOUS | Status: DC | PRN
  Administered 2022-07-15: 5 mg via INTRAVENOUS
  Administered 2022-07-15: 2.5 mg via INTRAVENOUS

## 2022-07-15 MED ORDER — SODIUM CHLORIDE 0.9 % IV SOLN
250.0000 mL | INTRAVENOUS | Status: DC
  Administered 2022-07-15: 250 mL via INTRAVENOUS

## 2022-07-15 MED ORDER — FENTANYL CITRATE (PF) 250 MCG/5ML IJ SOLN
INTRAMUSCULAR | Status: DC | PRN
  Administered 2022-07-15: 25 ug via INTRAVENOUS
  Administered 2022-07-15: 100 ug via INTRAVENOUS
  Administered 2022-07-15: 25 ug via INTRAVENOUS

## 2022-07-15 MED ORDER — OXYCODONE HCL ER 10 MG PO T12A
10.0000 mg | EXTENDED_RELEASE_TABLET | Freq: Two times a day (BID) | ORAL | Status: DC
  Administered 2022-07-15 (×2): 10 mg via ORAL
  Filled 2022-07-15 (×2): qty 1

## 2022-07-15 MED ORDER — ROCURONIUM BROMIDE 10 MG/ML (PF) SYRINGE
PREFILLED_SYRINGE | INTRAVENOUS | Status: DC | PRN
  Administered 2022-07-15 (×3): 20 mg via INTRAVENOUS
  Administered 2022-07-15: 60 mg via INTRAVENOUS

## 2022-07-15 SURGICAL SUPPLY — 72 items
ADH SKN CLS APL DERMABOND .7 (GAUZE/BANDAGES/DRESSINGS) ×1
APL SKNCLS STERI-STRIP NONHPOA (GAUZE/BANDAGES/DRESSINGS)
BAG COUNTER SPONGE SURGICOUNT (BAG) ×1 IMPLANT
BAG SPNG CNTER NS LX DISP (BAG) ×1
BASKET BONE COLLECTION (BASKET) ×1 IMPLANT
BENZOIN TINCTURE PRP APPL 2/3 (GAUZE/BANDAGES/DRESSINGS) IMPLANT
BIT DRILL PLIF MAS DISP 5.5MM (DRILL) IMPLANT
BLADE BONE MILL MEDIUM (MISCELLANEOUS) ×1 IMPLANT
BLADE CLIPPER SURG (BLADE) IMPLANT
BUR MATCHSTICK NEURO 3.0 LAGG (BURR) ×1 IMPLANT
BUR PRECISION FLUTE 5.0 (BURR) ×1 IMPLANT
CANISTER SUCT 3000ML PPV (MISCELLANEOUS) ×1 IMPLANT
CAP RELINE MOD TULIP RMM (Cap) IMPLANT
CNTNR URN SCR LID CUP LEK RST (MISCELLANEOUS) ×1 IMPLANT
CONT SPEC 4OZ STRL OR WHT (MISCELLANEOUS) ×1
COVER BACK TABLE 60X90IN (DRAPES) ×1 IMPLANT
DERMABOND ADVANCED .7 DNX12 (GAUZE/BANDAGES/DRESSINGS) ×1 IMPLANT
DRAPE C-ARM 42X72 X-RAY (DRAPES) ×2 IMPLANT
DRAPE C-ARMOR (DRAPES) IMPLANT
DRAPE LAPAROTOMY 100X72X124 (DRAPES) ×1 IMPLANT
DRAPE SURG 17X23 STRL (DRAPES) ×1 IMPLANT
DRILL PLIF MAS DISP 5.5MM (DRILL) ×1
DRSG OPSITE POSTOP 4X6 (GAUZE/BANDAGES/DRESSINGS) IMPLANT
DURAPREP 26ML APPLICATOR (WOUND CARE) ×1 IMPLANT
ELECT BLADE 4.0 EZ CLEAN MEGAD (MISCELLANEOUS) ×1
ELECT BLADE 6.5 EXT (BLADE) IMPLANT
ELECT REM PT RETURN 9FT ADLT (ELECTROSURGICAL) ×1
ELECTRODE BLDE 4.0 EZ CLN MEGD (MISCELLANEOUS) IMPLANT
ELECTRODE REM PT RTRN 9FT ADLT (ELECTROSURGICAL) ×1 IMPLANT
FIBER BONE ALLOSYNC EXPAND 5 (Bone Implant) IMPLANT
GAUZE 4X4 16PLY ~~LOC~~+RFID DBL (SPONGE) IMPLANT
GAUZE SPONGE 4X4 12PLY STRL (GAUZE/BANDAGES/DRESSINGS) IMPLANT
GLOVE EXAM NITRILE XL STR (GLOVE) IMPLANT
GLOVE SURG LTX SZ6.5 (GLOVE) ×2 IMPLANT
GOWN STRL REUS W/ TWL LRG LVL3 (GOWN DISPOSABLE) ×2 IMPLANT
GOWN STRL REUS W/ TWL XL LVL3 (GOWN DISPOSABLE) IMPLANT
GOWN STRL REUS W/TWL 2XL LVL3 (GOWN DISPOSABLE) IMPLANT
GOWN STRL REUS W/TWL LRG LVL3 (GOWN DISPOSABLE) ×2
GOWN STRL REUS W/TWL XL LVL3 (GOWN DISPOSABLE)
KIT BASIN OR (CUSTOM PROCEDURE TRAY) ×1 IMPLANT
KIT POSITION SURG JACKSON T1 (MISCELLANEOUS) ×1 IMPLANT
KIT TURNOVER KIT B (KITS) ×1 IMPLANT
MILL BONE PREP (MISCELLANEOUS) IMPLANT
NDL HYPO 25X1 1.5 SAFETY (NEEDLE) ×1 IMPLANT
NDL SPNL 18GX3.5 QUINCKE PK (NEEDLE) IMPLANT
NEEDLE HYPO 25X1 1.5 SAFETY (NEEDLE) ×1 IMPLANT
NEEDLE SPNL 18GX3.5 QUINCKE PK (NEEDLE) IMPLANT
NS IRRIG 1000ML POUR BTL (IV SOLUTION) ×1 IMPLANT
PACK LAMINECTOMY NEURO (CUSTOM PROCEDURE TRAY) ×1 IMPLANT
PAD ARMBOARD 7.5X6 YLW CONV (MISCELLANEOUS) ×2 IMPLANT
PLATE AFFIX 35MM (Plate) IMPLANT
ROD RELINE COCR LORD 5.0X35 (Rod) IMPLANT
SCREW LOCK RSS 4.5/5.0MM (Screw) IMPLANT
SCREW SHANK RELINE 6.5X40MM (Screw) IMPLANT
SCREW SHANK RELINE MOD 6.5X35 (Screw) IMPLANT
SOL ELECTROSURG ANTI STICK (MISCELLANEOUS)
SOLUTION ELECTROSURG ANTI STCK (MISCELLANEOUS) ×1 IMPLANT
SPACER PROLIFT 8X8X8-13 15D (Spacer) IMPLANT
SPIKE FLUID TRANSFER (MISCELLANEOUS) ×1 IMPLANT
SPONGE SURGIFOAM ABS GEL 100 (HEMOSTASIS) ×1 IMPLANT
SPONGE T-LAP 4X18 ~~LOC~~+RFID (SPONGE) IMPLANT
STRIP CLOSURE SKIN 1/2X4 (GAUZE/BANDAGES/DRESSINGS) IMPLANT
SUT PROLENE 6 0 BV (SUTURE) IMPLANT
SUT VIC AB 0 CT1 18XCR BRD8 (SUTURE) ×1 IMPLANT
SUT VIC AB 0 CT1 8-18 (SUTURE) ×2
SUT VIC AB 2-0 CT1 18 (SUTURE) ×1 IMPLANT
SUT VIC AB 3-0 SH 8-18 (SUTURE) ×1 IMPLANT
SYR CONTROL 10ML LL (SYRINGE) IMPLANT
TOWEL GREEN STERILE (TOWEL DISPOSABLE) ×1 IMPLANT
TOWEL GREEN STERILE FF (TOWEL DISPOSABLE) ×1 IMPLANT
TRAY FOLEY MTR SLVR 16FR STAT (SET/KITS/TRAYS/PACK) ×1 IMPLANT
WATER STERILE IRR 1000ML POUR (IV SOLUTION) ×1 IMPLANT

## 2022-07-15 NOTE — Op Note (Signed)
07/15/2022  7:52 PM  PATIENT:  Evan [name]  67 y.o. male  PRE-OPERATIVE DIAGNOSIS:  Spondylolisthesis, Lumbar region L5/S1 Psuedoarthrosis L5/S1  POST-OPERATIVE DIAGNOSIS:  Spondylolisthesis, Lumbar region L5/S1 Psuedoarthrosis L5/S1  PROCEDURE:  Procedure(s): Lumbar Five-Sacral One Posterior Lumbar Interbody Fusion Exploration of arthrodesis L5/S1 Laminectomy in excess of needed exposure for a plif L5 Posterolateral arthrodesis L5/S1 Spinous process plate Nuvasive [id number] SURGEON:  Surgeon(s): Ashok Pall, MD Vallarie Mare, MD  ASSISTANTS:Thomas, Roderic Palau  ANESTHESIA:   general  EBL:  No intake/output data recorded.  BLOOD ADMINISTERED:none  CELL SAVER GIVEN:not used  COUNT:per nursing  DRAINS: none   SPECIMEN:  No Specimen  DICTATION: [name] is a 67 y.o. male whom was taken to the operating room intubated, and placed under a general anesthetic without difficulty. A foley catheter was placed under sterile conditions. He was positioned prone on a Jackson table with all pressure points properly padded.  His lumbar region was prepped and draped in a sterile manner. I infiltrated 10cc's 1/2%lidocaine/1:2000,000 strength epinephrine into the planned incision. I opened the skin with a 10 blade and took the incision down to the thoracolumbar fascia. I exposed the lamina of L5 and S1 in a subperiosteal fashion bilaterally. I confirmed my location with an intraoperative xray.  I placed self retaining retractors and started the exposure of the hardware on the right, the arthrodesis exploration, and the canal decompression. We removed the hardware including the pedicle screws, the locking caps and the rod. The screws were loose in each pedicle. The psuedoarthrosis was identified.  I decompressed the spinal canal via a laminectomy on the left of L5. I also perf PLIF was performed at L5/S1  on the left side in the following fashion. I opened the disc space with  a 15 blade then used a variety of instruments to remove the disc and prepare the space for the arthrodesis. I used curettes, rongeurs, punches, shavers for the disc space, and rasps in the discetomy. I measured the disc space and placed one expandable titanium cage(Stryker) cage into the disc space(s). The cage and disc space was packed with autograft and allograft morsels I decorticated the lateral bone at L5/S1. I then placed allograft and autograft morsels on the decorticated surfaces to complete the posterolateral arthrodesis.  I placed pedicle screws at L5/S1 on the left, using fluoroscopic guidance. I drilled a pilot hole, then cannulated the pedicle with a drill at each site. I then tapped each pedicle, assessing each site for pedicle violations. No cutouts were appreciated. Screws (nuvasive relign) were then placed at each site without difficulty. I attached rods and locking caps with the appropriate tools. The locking caps were secured with torque limited screwdrivers. Final films were performed and the final construct appeared to be in good position.  I placed a spinous process plate at QA348G to add more stability. I did not replace the screws on the right side.  I closed the wound in a layered fashion. I approximated the thoracolumbar fascia, subcutaneous, and subcuticular planes with vicryl sutures. I used dermabond and an occlusive bandage for a sterile dressing.  He was placed supine on the OR bed and extubated moving all extremities   PLAN OF CARE: Admit to inpatient   PATIENT DISPOSITION:  PACU - hemodynamically stable.   Delay start of Pharmacological VTE agent (>24hrs) due to surgical blood loss or risk of bleeding:  yes

## 2022-07-15 NOTE — Anesthesia Procedure Notes (Addendum)
Procedure Name: Intubation Date/Time: 07/15/2022 9:03 AM  Performed by: Thelma Comp, CRNAPre-anesthesia Checklist: Patient identified, Emergency Drugs available, Suction available and Patient being monitored Patient Re-evaluated:Patient Re-evaluated prior to induction Oxygen Delivery Method: Circle System Utilized Preoxygenation: Pre-oxygenation with 100% oxygen Induction Type: IV induction Ventilation: Mask ventilation without difficulty and Oral airway inserted - appropriate to patient size Laryngoscope Size: Glidescope and 4 Grade View: Grade I Tube type: Oral Tube size: 7.5 mm Number of attempts: 1 Airway Equipment and Method: Oral airway, Rigid stylet and Video-laryngoscopy Placement Confirmation: ETT inserted through vocal cords under direct vision, positive ETCO2 and breath sounds checked- equal and bilateral Secured at: 23 cm Tube secured with: Tape Dental Injury: Teeth and Oropharynx as per pre-operative assessment  Comments: Performed by Elson Areas SRNA

## 2022-07-15 NOTE — H&P (Signed)
BP 132/81   Pulse 64   Temp 97.9 F (36.6 C) (Oral)   Resp 17   Ht 5\' 8"  (1.727 m)   Wt 114.8 kg   SpO2 94%   BMI 38.47 kg/m  Evan [name] returns today.  He had been doing well until approximately a year ago.  He has been spending a great deal of time in bed, and both he and his wife agree on the current number, which is about 20 hours.  He states simply that it hurts a great deal whenever he stands and this is all in the right lower extremity.  The left side is not problematic.  He weighs 256 pounds.  He says he has gained a significant amount of weight, well over 50 pounds, since he stops smoking.  Blood pressure is 131/81, pulse is 89.  Pain is 3/10 to 4/10.     He underwent a single-sided right L5-S1 fusion with a single cage placed on the right side.  There has been some subsidence of L5 into the cage, but the cage has not moved in position.  There has been no CT of the lumbar spine to show whether or not he has a solid arthrodesis.  There is mild halo around the L5 screw.  He has not had an MRI of the lumbar spine so we are not sure that where he has a retrolisthesis of 3 on 4 could be problematic or frankly any other area within the lumbar region.       He is alert, oriented by 4, and he answers all questions appropriately.  He has no new bowel or bladder changes.  He is in control and is continent.  Strength is good, 4/5 to 5/5 in the lower extremities.  2+ reflexes at both the knees and ankles bilaterally.     The hardware is not broken on the film.  Again, cages in L5 have subsided one into the other.  I will order an MRI of the lumbar spine and a CT of the lumbar spine.  I will see Evan [name] in the office afterwards.    Evan [name] returns today, after the MRI and CT.  He does not have a solid arthrodesis at his operated level.  He does have some stenosis.  The screw on the right side is toggling right-sided hardware.  The facet on the left is in exceedingly bad condition  at 4-5, and I think just going ahead and doing hardware on the left side and honestly placing a cage, because he does have some pain down both legs just decompressing this area, would be in his best interest.  He has some subsidence from his previous operation, but the cage itself has not moved.  The bone has moved, but not the cage.  He weighs 253 pounds.  Temperature is 97.7, blood pressure 120/71, pulse 89.  Pain is 4/10.

## 2022-07-15 NOTE — Evaluation (Signed)
Physical Therapy Evaluation Patient Details Name: Evan [name] MRN: [id number] DOB: 06-05-1955 Today's Date: 07/15/2022  History of Present Illness  Pt is a 67 y.o. male who presented 07/15/22 for L5-S1 PLIF. PMH: asthma, CHF, CKD, COPD, DVT, emphysema, GERD, HTN, neuromuscular disorder, osteoporosis, seizures, sleep apnea   Clinical Impression  Pt presents with condition above and deficits mentioned below, see PT Problem List. At baseline, pt would primarily remain in bed due to his pain, but when he would get up during the day he was able to perform functional mobility at a mod I level, holding onto furniture for support in his home and using a SPC out in the community. Pt typically lives alone in a 1-level house with 3 STE, but he plans to d/c to his significant other's house initially.  His significant other has a 1-level house with 1 STE. Pt reports a hx of diminished sensation to touch on his R side of his body and hypersensitivity to touch on his L side of his body since a neck surgery in 1975. Pt also reports his L leg weakness compared to his R is due to the neck issues. Currently, pt is able to perform all functional mobility slowly and cautiously at a min guard assist level while using a RW. As distance progressed, pt's legs were noted to become more shaky, thus returned pt to his room for safety. Pt will likely progress well as his pain improves and pt is declining a need for follow-up with HH or OP PT upon d/c. He reports he recalls exercises from previous OPPT sessions. Pt agreeable to acute PT continuing to follow-up with him to maximize his independence and safety with functional mobility prior to d/c home.      Recommendations for follow up therapy are one component of a multi-disciplinary discharge planning process, led by the attending physician.  Recommendations may be updated based on patient status, additional functional criteria and insurance authorization.  Follow Up  Recommendations No PT follow up (pt declining HH & OP PT)      Assistance Recommended at Discharge Intermittent Supervision/Assistance  Patient can return home with the following  A little help with walking and/or transfers;A little help with bathing/dressing/bathroom;Assistance with cooking/housework;Assist for transportation;Help with stairs or ramp for entrance    Equipment Recommendations None recommended by PT (has all needed DME)  Recommendations for Other Services       Functional Status Assessment Patient has had a recent decline in their functional status and demonstrates the ability to make significant improvements in function in a reasonable and predictable amount of time.     Precautions / Restrictions Precautions Precautions: Fall;Back Precaution Booklet Issued: Yes (comment) Precaution Comments: provided handout and reviewed spinal precautions Required Braces or Orthoses:  (no brace needed orders) Restrictions Weight Bearing Restrictions: No      Mobility  Bed Mobility Overal bed mobility: Needs Assistance Bed Mobility: Rolling, Sidelying to Sit, Sit to Sidelying Rolling: Min guard Sidelying to sit: Min guard, HOB elevated     Sit to sidelying: Min guard, HOB elevated General bed mobility comments: Min guard assist for safety and cuing pt to maintain spinal precautions via log rolling and transitioning sidelying <> sit EOB. Pt using bed rail.    Transfers Overall transfer level: Needs assistance Equipment used: Rolling walker (2 wheels) Transfers: Sit to/from Stand Sit to Stand: Min guard           General transfer comment: Extra time and effort to power up  to stand from EOB, but able to do so without LOB, min guard for safety. Cues provided to push up from bed.    Ambulation/Gait Ambulation/Gait assistance: Min guard Gait Distance (Feet): 50 Feet Assistive device: Rolling walker (2 wheels) Gait Pattern/deviations: Step-through pattern, Decreased  stride length, Trunk flexed Gait velocity: reduced Gait velocity interpretation: <1.31 ft/sec, indicative of household ambulator   General Gait Details: Pt taking small, slow steps with mildly flexed posture, likely due to pain. Noted shakiness of legs as distance progressed, thus cued pt to turn and return to room for safety. No LOB, min guard for safety  Stairs            Wheelchair Mobility    Modified Rankin (Stroke Patients Only)       Balance Overall balance assessment: Needs assistance Sitting-balance support: No upper extremity supported, Feet supported Sitting balance-Leahy Scale: Good     Standing balance support: Bilateral upper extremity supported, During functional activity, Reliant on assistive device for balance Standing balance-Leahy Scale: Poor Standing balance comment: Reliant on RW                             Pertinent Vitals/Pain Pain Assessment Pain Assessment: Faces Faces Pain Scale: Hurts even more Pain Location: back Pain Descriptors / Indicators: Discomfort, Grimacing, Operative site guarding Pain Intervention(s): Limited activity within patient's tolerance, Monitored during session, Repositioned    Home Living Family/patient expects to be discharged to:: Private residence Living Arrangements: Alone Available Help at Discharge: Available 24 hours/day;Family Type of Home: House Home Access: Stairs to enter Entrance Stairs-Rails: None Entrance Stairs-Number of Steps: 1   Home Layout: One level Home Equipment: Conservation officer, nature (2 wheels);Shower seat;Cane - single point;Wheelchair - manual Additional Comments: info above is for significant other's home, which he plans to d/c home with her initially; pt typically lives alone in a 1-level house with 3 STE, tub/shower combo, and standard height toilets    Prior Function Prior Level of Function : Independent/Modified Independent;Driving             Mobility Comments: Uses SPC  outside, supports self on furniture in the home; stayed in bed the majority of the day due to the pain ADLs Comments: Does not clean his home and does not hire anyone to clean it. Cooks minimally. Otherwise, can bathe and dress himself and manage meds and finances independently     Hand Dominance        Extremity/Trunk Assessment   Upper Extremity Assessment Upper Extremity Assessment: Defer to OT evaluation    Lower Extremity Assessment Lower Extremity Assessment: RLE deficits/detail;LLE deficits/detail RLE Deficits / Details: pt reports hx of slightly diminished sensation throughout R side of body since neck surgery in 1975; MMT scores of 5 knee extension, 5 ankle dorsiflexion RLE Sensation: decreased light touch LLE Deficits / Details: pt reports hx of hypersensitivity throughout L side of body since neck surgery in 1975; MMT scores of 4 knee extension, 5 ankle dorsiflexion LLE Sensation:  (hypersensitive)    Cervical / Trunk Assessment Cervical / Trunk Assessment: Back Surgery  Communication   Communication: No difficulties  Cognition Arousal/Alertness: Awake/alert Behavior During Therapy: WFL for tasks assessed/performed Overall Cognitive Status: Within Functional Limits for tasks assessed  General Comments General comments (skin integrity, edema, etc.): encouraged frequent mobility    Exercises     Assessment/Plan    PT Assessment Patient needs continued PT services  PT Problem List Decreased strength;Decreased activity tolerance;Decreased balance;Decreased mobility;Impaired sensation;Pain       PT Treatment Interventions DME instruction;Stair training;Gait training;Functional mobility training;Therapeutic exercise;Therapeutic activities;Balance training;Neuromuscular re-education;Patient/family education    PT Goals (Current goals can be found in the Care Plan section)  Acute Rehab PT Goals Patient Stated  Goal: to reduce pain PT Goal Formulation: With patient/family Time For Goal Achievement: 07/29/22 Potential to Achieve Goals: Good    Frequency Min 5X/week     Co-evaluation               AM-PAC PT "6 Clicks" Mobility  Outcome Measure Help needed turning from your back to your side while in a flat bed without using bedrails?: A Little Help needed moving from lying on your back to sitting on the side of a flat bed without using bedrails?: A Little Help needed moving to and from a bed to a chair (including a wheelchair)?: A Little Help needed standing up from a chair using your arms (e.g., wheelchair or bedside chair)?: A Little Help needed to walk in hospital room?: A Little Help needed climbing 3-5 steps with a railing? : A Little 6 Click Score: 18    End of Session Equipment Utilized During Treatment: Gait belt Activity Tolerance: Patient tolerated treatment well Patient left: in bed;with call bell/phone within reach;with bed alarm set;with family/visitor present;with nursing/sitter in room Nurse Communication: Mobility status PT Visit Diagnosis: Unsteadiness on feet (R26.81);Other abnormalities of gait and mobility (R26.89);Muscle weakness (generalized) (M62.81);Difficulty in walking, not elsewhere classified (R26.2);Pain Pain - part of body:  (back)    Time: [id number] PT Time Calculation (min) (ACUTE ONLY): 23 min   Charges:   PT Evaluation $PT Eval Moderate Complexity: 1 Mod PT Treatments $Therapeutic Activity: 8-22 mins        Moishe Spice, PT, DPT Acute Rehabilitation Services  Office: [phone]   Orvan Falconer 07/15/2022, 5:19 PM

## 2022-07-15 NOTE — Anesthesia Postprocedure Evaluation (Signed)
Anesthesia Post Note  Patient: Evan [name]  Procedure(s) Performed: Lumbar Five-Sacral One Posterior Lumbar Interbody Fusion     Patient location during evaluation: PACU Anesthesia Type: General Level of consciousness: awake and alert Pain management: pain level controlled Vital Signs Assessment: post-procedure vital signs reviewed and stable Respiratory status: spontaneous breathing, nonlabored ventilation, respiratory function stable and patient connected to nasal cannula oxygen Cardiovascular status: blood pressure returned to baseline and stable Postop Assessment: no apparent nausea or vomiting Anesthetic complications: no   No notable events documented.  Last Vitals:  Vitals:   07/15/22 1514 07/15/22 1537  BP: 128/78 121/77  Pulse: 71 74  Resp: 11 18  Temp: 36.7 C 36.7 C  SpO2: 94% 100%    Last Pain:  Vitals:   07/15/22 1445  TempSrc:   PainSc: Asleep                 Belenda Cruise P Marcques Wrightsman

## 2022-07-15 NOTE — Transfer of Care (Signed)
Immediate Anesthesia Transfer of Care Note  Patient: Lynnell Chad  Procedure(s) Performed: Lumbar Five-Sacral One Posterior Lumbar Interbody Fusion  Patient Location: PACU  Anesthesia Type:General  Level of Consciousness: drowsy and patient cooperative  Airway & Oxygen Therapy: Patient Spontanous Breathing and Patient connected to face mask oxygen  Post-op Assessment: Report given to RN and Post -op Vital signs reviewed and stable  Post vital signs: Reviewed and stable  Last Vitals:  Vitals Value Taken Time  BP 108/66 07/15/22 1341  Temp    Pulse 80 07/15/22 1344  Resp 12 07/15/22 1344  SpO2 95 % 07/15/22 1344  Vitals shown include unvalidated device data.  Last Pain:  Vitals:   07/15/22 0706  TempSrc:   PainSc: 3       Patients Stated Pain Goal: 0 ([id number])  Complications: No notable events documented.

## 2022-07-16 DIAGNOSIS — K219 Gastro-esophageal reflux disease without esophagitis: Secondary | ICD-10-CM | POA: Diagnosis not present

## 2022-07-16 DIAGNOSIS — I110 Hypertensive heart disease with heart failure: Secondary | ICD-10-CM | POA: Diagnosis not present

## 2022-07-16 DIAGNOSIS — R569 Unspecified convulsions: Secondary | ICD-10-CM | POA: Diagnosis not present

## 2022-07-16 DIAGNOSIS — M960 Pseudarthrosis after fusion or arthrodesis: Secondary | ICD-10-CM | POA: Diagnosis not present

## 2022-07-16 DIAGNOSIS — I2510 Atherosclerotic heart disease of native coronary artery without angina pectoris: Secondary | ICD-10-CM | POA: Diagnosis not present

## 2022-07-16 DIAGNOSIS — G4730 Sleep apnea, unspecified: Secondary | ICD-10-CM | POA: Diagnosis not present

## 2022-07-16 DIAGNOSIS — R262 Difficulty in walking, not elsewhere classified: Secondary | ICD-10-CM | POA: Diagnosis not present

## 2022-07-16 DIAGNOSIS — M4317 Spondylolisthesis, lumbosacral region: Secondary | ICD-10-CM | POA: Diagnosis not present

## 2022-07-16 DIAGNOSIS — R2689 Other abnormalities of gait and mobility: Secondary | ICD-10-CM | POA: Diagnosis not present

## 2022-07-16 DIAGNOSIS — F419 Anxiety disorder, unspecified: Secondary | ICD-10-CM | POA: Diagnosis not present

## 2022-07-16 DIAGNOSIS — I509 Heart failure, unspecified: Secondary | ICD-10-CM | POA: Diagnosis not present

## 2022-07-16 DIAGNOSIS — M6281 Muscle weakness (generalized): Secondary | ICD-10-CM | POA: Diagnosis not present

## 2022-07-16 DIAGNOSIS — F32A Depression, unspecified: Secondary | ICD-10-CM | POA: Diagnosis not present

## 2022-07-16 DIAGNOSIS — R2681 Unsteadiness on feet: Secondary | ICD-10-CM | POA: Diagnosis not present

## 2022-07-16 DIAGNOSIS — J449 Chronic obstructive pulmonary disease, unspecified: Secondary | ICD-10-CM | POA: Diagnosis not present

## 2022-07-16 MED ORDER — METHOCARBAMOL 750 MG PO TABS: 750.0000 mg | ORAL_TABLET | Freq: Three times a day (TID) | ORAL | 2 refills | Status: AC | PRN

## 2022-07-16 NOTE — Discharge Summary (Signed)
Discharge Summary   Date of Admission: 07/15/22   Date of Discharge: 07/16/22   Attending Physician:  Ashok Pall, MD  Hospital Course: Patient was admitted following an uncomplicated revision [id number] PLIF. They were recovered in PACU and transferred to [facility]. Their preop symptoms were improved. Pt to f/u in office in 2 weeks for post op visit. Pt is in agreement w/ plan.    Neurologic exam at discharge:  A&O x3 Strength 5/5 x4.  SILTx4.  Incisions c/d/I.  Pt ambulating without difficulty.      Discharge diagnosis:  Spondylolisthesis, Lumbar region L5/S1 Psuedoarthrosis L5/S1   Caroline More, [facility]

## 2022-07-16 NOTE — Evaluation (Signed)
Occupational Therapy Evaluation Patient Details Name: Evan [name] MRN: [id number] DOB: July 02, 1955 Today's Date: 07/16/2022   History of Present Illness Pt is a 67 y.o. male who presented 07/15/22 for L5-S1 PLIF. PMH: asthma, CHF, CKD, COPD, DVT, emphysema, GERD, HTN, neuromuscular disorder, osteoporosis, seizures, sleep apnea   Clinical Impression   PTA, pt lived alone and was mod I for ADL and IADL; has recently been performing less ADL due to decr safety and activity tolerance. Upon eval, pt performing UB ADL with set-up and LB ADL with min guard A with use of AE and intermittent cues. Educated regarding importance of following spinal precautions during recovery and pt verbalizing understanding. Pt educated and demonstrating use of compensatory techniques for bed mobility, LB ADL, grooming, toileting, and shower transfers. Recommending discharge home with friend to assist as needed. No OT follow up needed at this time. Thank you for this order. OT to sign off.      Recommendations for follow up therapy are one component of a multi-disciplinary discharge planning process, led by the attending physician.  Recommendations may be updated based on patient status, additional functional criteria and insurance authorization.   Follow Up Recommendations  No OT follow up     Assistance Recommended at Discharge Intermittent Supervision/Assistance  Patient can return home with the following A little help with walking and/or transfers;A little help with bathing/dressing/bathroom;Assistance with cooking/housework;Assist for transportation;Help with stairs or ramp for entrance    Functional Status Assessment  Patient has had a recent decline in their functional status and demonstrates the ability to make significant improvements in function in a reasonable and predictable amount of time.  Equipment Recommendations  None recommended by OT    Recommendations for Other Services        Precautions / Restrictions Precautions Precautions: Fall;Back Precaution Booklet Issued: Yes (comment) Precaution Comments: All precautions reviewed during ADL Required Braces or Orthoses:  (No brace needed orders) Restrictions Weight Bearing Restrictions: No      Mobility Bed Mobility Overal bed mobility: Needs Assistance Bed Mobility: Rolling, Sidelying to Sit, Sit to Sidelying Rolling: Min guard Sidelying to sit: Min guard, HOB elevated     Sit to sidelying: Min guard, HOB elevated General bed mobility comments: Min guard A for safety only. Pt has rail on bed at home, and friend to assist when he stays with her    Transfers Overall transfer level: Needs assistance Equipment used: None, Rolling walker (2 wheels) Transfers: Sit to/from Stand Sit to Stand: Min guard           General transfer comment: Extra time and effort to power up to stand from EOB, but able to do so without LOB, min guard for safety.      Balance Overall balance assessment: Needs assistance Sitting-balance support: No upper extremity supported, Feet supported Sitting balance-Leahy Scale: Good     Standing balance support: Bilateral upper extremity supported, During functional activity, Reliant on assistive device for balance Standing balance-Leahy Scale: Fair                             ADL either performed or assessed with clinical judgement   ADL Overall ADL's : Needs assistance/impaired Eating/Feeding: Modified independent;Sitting   Grooming: Standing;Supervision/safety Grooming Details (indicate cue type and reason): reviewed compensatory techniques Upper Body Bathing: Set up;Sitting   Lower Body Bathing: Min guard;Sit to/from stand Lower Body Bathing Details (indicate cue type and reason): Reviewed use of long  handled sponge Upper Body Dressing : Set up;Sitting   Lower Body Dressing: Min guard;Sit to/from stand Lower Body Dressing Details (indicate cue type and  reason): Min guard A for safety with use of AE and min cues to avoid bending forward. Pt has reacher; encouraged him to get shoe horn and sock aid Toilet Transfer: Min guard;Ambulation;Comfort height toilet   Toileting- Clothing Manipulation and Hygiene: Supervision/safety;Sitting/lateral lean Toileting - Clothing Manipulation Details (indicate cue type and reason): Pt reports he wipes with anterior approach; has a toilet aid if he needs it Tub/ Shower Transfer: Min guard;Ambulation;Tub transfer;Walk-in shower;Shower seat   Functional mobility during ADLs: Min guard       Vision Ability to See in Adequate Light: 0 Adequate Patient Visual Report: No change from baseline Vision Assessment?: No apparent visual deficits     Perception Perception Perception Tested?: No   Praxis Praxis Praxis tested?: Not tested    Pertinent Vitals/Pain Pain Assessment Pain Assessment: Faces Faces Pain Scale: Hurts little more Pain Location: operative site Pain Descriptors / Indicators: Discomfort, Operative site guarding Pain Intervention(s): Limited activity within patient's tolerance, Monitored during session     Hand Dominance Right   Extremity/Trunk Assessment Upper Extremity Assessment Upper Extremity Assessment: Generalized weakness;LUE deficits/detail;RUE deficits/detail RUE Deficits / Details: Pt reports decr sensation LUE Deficits / Details: tremor, weakness, hypersensitivity at baseline LUE Sensation: decreased light touch LUE Coordination: decreased fine motor   Lower Extremity Assessment Lower Extremity Assessment: Defer to PT evaluation   Cervical / Trunk Assessment Cervical / Trunk Assessment: Back Surgery   Communication Communication Communication: No difficulties   Cognition Arousal/Alertness: Awake/alert Behavior During Therapy: WFL for tasks assessed/performed Overall Cognitive Status: Within Functional Limits for tasks assessed                                  General Comments: Intermittent Min cues to avoid bending forward during ADL     General Comments       Exercises     Shoulder Instructions      Home Living Family/patient expects to be discharged to:: Private residence Living Arrangements: Alone Available Help at Discharge: Available 24 hours/day;Family;Friend(s) Type of Home: House Home Access: Stairs to enter Technical brewer of Steps: 1 Entrance Stairs-Rails: None Home Layout: One level     Bathroom Shower/Tub: Occupational psychologist: Standard     Home Equipment: Conservation officer, nature (2 wheels);Shower seat;Cane - single point;Wheelchair - Oncologist;Other (Comment) (toilet aid) Additional Comments: info above is for significant other's home, which he plans to d/c home with her initially; pt typically lives alone in a 1-level house with 3 STE, tub/shower combo, and standard height toilets      Prior Functioning/Environment Prior Level of Function : Independent/Modified Independent;Driving             Mobility Comments: Uses SPC outside, supports self on furniture in the home; stayed in bed the majority of the day due to the pain ADLs Comments: Recently has had difficulty cleaning his home and has been cooking microwave meals due to decr standing tolerance. pt reports this is improving. He will be staying with friend and then friend will come stay with him when he first transitions from her place to home. Previously mod I in ADL        OT Problem List: Decreased strength;Decreased activity tolerance;Impaired balance (sitting and/or standing);Decreased coordination;Decreased safety awareness;Decreased knowledge of  use of DME or AE;Impaired sensation      OT Treatment/Interventions:      OT Goals(Current goals can be found in the care plan section) Acute Rehab OT Goals Patient Stated Goal: get more mobile so I can lose some weight OT Goal Formulation:  With patient Time For Goal Achievement: 07/30/22 Potential to Achieve Goals: Good  OT Frequency:      Co-evaluation              AM-PAC OT "6 Clicks" Daily Activity     Outcome Measure Help from another person eating meals?: None Help from another person taking care of personal grooming?: A Little Help from another person toileting, which includes using toliet, bedpan, or urinal?: A Little Help from another person bathing (including washing, rinsing, drying)?: A Little Help from another person to put on and taking off regular upper body clothing?: A Little Help from another person to put on and taking off regular lower body clothing?: A Little 6 Click Score: 19   End of Session Equipment Utilized During Treatment: Gait belt;Rolling walker (2 wheels) Nurse Communication: Mobility status  Activity Tolerance: Patient tolerated treatment well Patient left: in bed;with call bell/phone within reach  OT Visit Diagnosis: Unsteadiness on feet (R26.81);Muscle weakness (generalized) (M62.81);Other abnormalities of gait and mobility (R26.89)                Time: [id number] OT Time Calculation (min): 25 min Charges:  OT General Charges $OT Visit: 1 Visit OT Evaluation $OT Eval Low Complexity: 1 Low OT Treatments $Self Care/Home Management : 8-22 mins  Elder Cyphers, OTR/L [facility] Acute Rehabilitation Office: [phone]   Magnus Ivan 07/16/2022, 9:49 AM

## 2022-07-16 NOTE — Progress Notes (Signed)
Physical Therapy Treatment Patient Details Name: Evan [name] MRN: [id number] DOB: 07-05-1955 Today's Date: 07/16/2022   History of Present Illness Pt is a 67 y.o. male who presented 07/15/22 for L5-S1 PLIF. PMH: asthma, CHF, CKD, COPD, DVT, emphysema, GERD, HTN, neuromuscular disorder, osteoporosis, seizures, sleep apnea    PT Comments    Pt progressing towards physical therapy goals. Was able to perform transfers and ambulation with gross min guard assist for balance support and safety with [facility] for support. Pt requiring cues throughout session for optimal maintenance of precautions during functional mobility. Reinforced education on precautions, positioning recommendations, activity progression, and car transfer. Will continue to follow.    Recommendations for follow up therapy are one component of a multi-disciplinary discharge planning process, led by the attending physician.  Recommendations may be updated based on patient status, additional functional criteria and insurance authorization.  Follow Up Recommendations  No PT follow up (pt declining HH & OP PT)     Assistance Recommended at Discharge Intermittent Supervision/Assistance  Patient can return home with the following A little help with walking and/or transfers;A little help with bathing/dressing/bathroom;Assistance with cooking/housework;Assist for transportation;Help with stairs or ramp for entrance   Equipment Recommendations  None recommended by PT (has all needed DME)    Recommendations for Other Services       Precautions / Restrictions Precautions Precautions: Fall;Back Precaution Booklet Issued: Yes (comment) Precaution Comments: All precautions reviewed during ADL Required Braces or Orthoses:  (No brace needed orders) Restrictions Weight Bearing Restrictions: No     Mobility  Bed Mobility Overal bed mobility: Needs Assistance Bed Mobility: Rolling, Sidelying to Sit, Sit to Sidelying Rolling:  Supervision Sidelying to sit: HOB elevated, Supervision     Sit to sidelying: HOB elevated, Supervision General bed mobility comments: Increased time and effort to transition to/from EOB. Pt has a bed rail at home so utilized here as well.    Transfers Overall transfer level: Needs assistance Equipment used: Straight cane Transfers: Sit to/from Stand Sit to Stand: Min guard           General transfer comment: Close guard for safety as pt powered up to full stand. Increased time and effort.    Ambulation/Gait Ambulation/Gait assistance: Min guard Gait Distance (Feet): 200 Feet Assistive device: Rolling walker (2 wheels) Gait Pattern/deviations: Step-through pattern, Decreased stride length, Trunk flexed Gait velocity: Decreased Gait velocity interpretation: <1.31 ft/sec, indicative of household ambulator   General Gait Details: Slow and guarded but overall without overt LOB when using SPC for support. Increased lateral sway to the L noted; pt reports LLE is 1/2 inch shorter and causes him to lean that way.   Stairs             Wheelchair Mobility    Modified Rankin (Stroke Patients Only)       Balance Overall balance assessment: Needs assistance Sitting-balance support: No upper extremity supported, Feet supported Sitting balance-Leahy Scale: Good     Standing balance support: Bilateral upper extremity supported, During functional activity, Reliant on assistive device for balance Standing balance-Leahy Scale: Fair Standing balance comment: Reliant on RW                            Cognition Arousal/Alertness: Awake/alert Behavior During Therapy: WFL for tasks assessed/performed Overall Cognitive Status: Within Functional Limits for tasks assessed  Exercises      General Comments        Pertinent Vitals/Pain Pain Assessment Pain Assessment: Faces Faces Pain Scale: Hurts little  more Pain Location: operative site Pain Descriptors / Indicators: Discomfort, Operative site guarding Pain Intervention(s): Limited activity within patient's tolerance, Monitored during session, Repositioned    Home Living                          Prior Function            PT Goals (current goals can now be found in the care plan section) Acute Rehab PT Goals Patient Stated Goal: Decrease pain, home today. PT Goal Formulation: With patient Time For Goal Achievement: 07/29/22 Potential to Achieve Goals: Good Progress towards PT goals: Progressing toward goals    Frequency    Min 5X/week      PT Plan Current plan remains appropriate    Co-evaluation              AM-PAC PT "6 Clicks" Mobility   Outcome Measure  Help needed turning from your back to your side while in a flat bed without using bedrails?: A Little Help needed moving from lying on your back to sitting on the side of a flat bed without using bedrails?: A Little Help needed moving to and from a bed to a chair (including a wheelchair)?: A Little Help needed standing up from a chair using your arms (e.g., wheelchair or bedside chair)?: A Little Help needed to walk in hospital room?: A Little Help needed climbing 3-5 steps with a railing? : A Little 6 Click Score: 18    End of Session Equipment Utilized During Treatment: Gait belt Activity Tolerance: Patient tolerated treatment well Patient left: in bed;with call bell/phone within reach;with bed alarm set;with family/visitor present;with nursing/sitter in room Nurse Communication: Mobility status PT Visit Diagnosis: Unsteadiness on feet (R26.81);Other abnormalities of gait and mobility (R26.89);Muscle weakness (generalized) (M62.81);Difficulty in walking, not elsewhere classified (R26.2);Pain Pain - part of body:  (back)     Time: [id number] PT Time Calculation (min) (ACUTE ONLY): 33 min  Charges:  $Gait Training: 23-37 mins                      Rolinda Roan, PT, DPT Acute Rehabilitation Services Secure Chat Preferred Office: [phone]    Thelma Comp 07/16/2022, 2:19 PM

## 2022-07-16 NOTE — Progress Notes (Signed)
Patient alert and oriented, void, ambulate, VSS. Surgical site clean and dry. D/c instructions explain and given to the patient, all questions answered. Pt. D/c home per order.

## 2022-07-30 DIAGNOSIS — Z79891 Long term (current) use of opiate analgesic: Secondary | ICD-10-CM | POA: Diagnosis not present

## 2022-07-30 DIAGNOSIS — G894 Chronic pain syndrome: Secondary | ICD-10-CM | POA: Diagnosis not present

## 2022-07-30 DIAGNOSIS — M961 Postlaminectomy syndrome, not elsewhere classified: Secondary | ICD-10-CM | POA: Diagnosis not present

## 2022-07-30 DIAGNOSIS — M545 Low back pain, unspecified: Secondary | ICD-10-CM | POA: Diagnosis not present

## 2022-08-03 DIAGNOSIS — M4316 Spondylolisthesis, lumbar region: Secondary | ICD-10-CM | POA: Diagnosis not present

## 2022-08-03 DIAGNOSIS — Z6838 Body mass index (BMI) 38.0-38.9, adult: Secondary | ICD-10-CM | POA: Diagnosis not present

## 2022-08-03 DIAGNOSIS — M4726 Other spondylosis with radiculopathy, lumbar region: Secondary | ICD-10-CM | POA: Diagnosis not present

## 2022-08-07 ENCOUNTER — Other Ambulatory Visit: Payer: Self-pay | Admitting: Family Medicine

## 2022-08-07 DIAGNOSIS — G4733 Obstructive sleep apnea (adult) (pediatric): Secondary | ICD-10-CM | POA: Diagnosis not present

## 2022-08-14 ENCOUNTER — Other Ambulatory Visit: Payer: Self-pay | Admitting: Family Medicine

## 2022-08-14 DIAGNOSIS — G4733 Obstructive sleep apnea (adult) (pediatric): Secondary | ICD-10-CM | POA: Diagnosis not present

## 2022-08-14 NOTE — Telephone Encounter (Signed)
Requested Prescriptions  Pending Prescriptions Disp Refills   tamsulosin (FLOMAX) 0.4 MG CAPS capsule [Pharmacy Med Name: tamsulosin 0.4 mg capsule] 90 capsule 0    Sig: Take 1 capsule (0.4 mg total) by mouth daily.     Urology: Alpha-Adrenergic Blocker Passed - 08/14/2022  9:09 AM      Passed - PSA in normal range and within 360 days    Prostate Specific Ag, Serum  Date Value Ref Range Status  06/08/2022 0.9 0.0 - 4.0 ng/mL Final    Comment:    Roche ECLIA methodology. According to the American Urological Association, Serum PSA should decrease and remain at undetectable levels after radical prostatectomy. The AUA defines biochemical recurrence as an initial PSA value 0.2 ng/mL or greater followed by a subsequent confirmatory PSA value 0.2 ng/mL or greater. Values obtained with different assay methods or kits cannot be used interchangeably. Results cannot be interpreted as absolute evidence of the presence or absence of malignant disease.          Passed - Last BP in normal range    BP Readings from Last 1 Encounters:  07/16/22 137/75         Passed - Valid encounter within last 12 months    Recent Outpatient Visits           2 months ago Annual physical exam   [place] [facility], Lauris Poag, MD   5 months ago OSA (obstructive sleep apnea)   [place] [facility], MD   6 months ago Essential hypertension   [place] [facility], Lauris Poag, MD       Future Appointments             In 3 weeks Georganna Skeans, MD [facility]             omeprazole (PRILOSEC) 40 MG capsule [Pharmacy Med Name: omeprazole 40 mg capsule,delayed release] 90 capsule 0    Sig: Take 1 capsule (40 mg total) by mouth daily.     Gastroenterology: Proton Pump Inhibitors Passed - 08/14/2022  9:09 AM      Passed - Valid encounter within last 12 months    Recent Outpatient Visits            2 months ago Annual physical exam   [place] [facility] at [facility], Lauris Poag, MD   5 months ago OSA (obstructive sleep apnea)   [place] [facility] at [facility], MD   6 months ago Essential hypertension   [place] [facility], MD       Future Appointments             In 3 weeks Georganna Skeans, MD [facility] at [facility]

## 2022-08-15 DIAGNOSIS — G4733 Obstructive sleep apnea (adult) (pediatric): Secondary | ICD-10-CM | POA: Diagnosis not present

## 2022-08-27 DIAGNOSIS — G894 Chronic pain syndrome: Secondary | ICD-10-CM | POA: Diagnosis not present

## 2022-08-27 DIAGNOSIS — M961 Postlaminectomy syndrome, not elsewhere classified: Secondary | ICD-10-CM | POA: Diagnosis not present

## 2022-08-27 DIAGNOSIS — M545 Low back pain, unspecified: Secondary | ICD-10-CM | POA: Diagnosis not present

## 2022-08-27 DIAGNOSIS — Z79891 Long term (current) use of opiate analgesic: Secondary | ICD-10-CM | POA: Diagnosis not present

## 2022-09-06 DIAGNOSIS — G4733 Obstructive sleep apnea (adult) (pediatric): Secondary | ICD-10-CM | POA: Diagnosis not present

## 2022-09-08 ENCOUNTER — Encounter: Payer: Self-pay | Admitting: Family Medicine

## 2022-09-08 ENCOUNTER — Ambulatory Visit (INDEPENDENT_AMBULATORY_CARE_PROVIDER_SITE_OTHER): Payer: Medicare HMO | Admitting: Family Medicine

## 2022-09-08 VITALS — BP 125/81 | HR 68 | Temp 98.1°F | Resp 16 | Wt 250.0 lb

## 2022-09-08 DIAGNOSIS — I10 Essential (primary) hypertension: Secondary | ICD-10-CM

## 2022-09-08 DIAGNOSIS — E785 Hyperlipidemia, unspecified: Secondary | ICD-10-CM

## 2022-09-08 DIAGNOSIS — G40909 Epilepsy, unspecified, not intractable, without status epilepticus: Secondary | ICD-10-CM | POA: Diagnosis not present

## 2022-09-08 DIAGNOSIS — K219 Gastro-esophageal reflux disease without esophagitis: Secondary | ICD-10-CM | POA: Diagnosis not present

## 2022-09-08 NOTE — Progress Notes (Signed)
Patient is here for their 3 month follow-up Patient has no concerns today Care gaps have been discussed with patient  

## 2022-09-08 NOTE — Progress Notes (Signed)
Established Patient Office Visit  Subjective    Patient ID: Evan [name], male    DOB: 1955/10/21  Age: 67 y.o. MRN: [id number]  CC: No chief complaint on file.   HPI Evan [name] presents for routine follow up of chronic med issues. Patient denies acute complaints or concerns. He does reports recent back surgery that has improved/resolved his sciatica.    Outpatient Encounter Medications as of 09/08/2022  Medication Sig   albuterol (VENTOLIN HFA) 108 (90 Base) MCG/ACT inhaler Inhale 1-2 puffs into the lungs every 6 (six) hours as needed for wheezing or shortness of breath.   ALPRAZolam (XANAX) 0.5 MG tablet Take 0.5 mg by mouth at bedtime as needed for anxiety.    amLODipine (NORVASC) 10 MG tablet Take 1 tablet (10 mg total) by mouth daily.   aspirin EC 81 MG tablet Take 1 tablet (81 mg total) by mouth daily.   ergocalciferol (VITAMIN D2) 1.25 MG (50000 UT) capsule Take 50,000 Units by mouth every 21 ( twenty-one) days.   fexofenadine (ALLEGRA) 180 MG tablet Take 180 mg by mouth daily as needed for allergies.   FLUoxetine (PROZAC) 20 MG capsule Take 1 capsule (20 mg total) by mouth daily.   gabapentin (NEURONTIN) 600 MG tablet Take 600 mg by mouth 2 (two) times daily.   HYDROcodone-acetaminophen (NORCO) 7.5-325 MG tablet Take 1 tablet by mouth every 6 (six) hours as needed for moderate pain or severe pain.    losartan (COZAAR) 25 MG tablet Take 1 tablet (25 mg total) by mouth at bedtime.   methocarbamol (ROBAXIN-750) 750 MG tablet Take 1 tablet (750 mg total) by mouth 3 (three) times daily as needed for muscle spasms.   omeprazole (PRILOSEC) 40 MG capsule Take 1 capsule (40 mg total) by mouth daily.   rosuvastatin (CRESTOR) 20 MG tablet Take 1 tablet (20 mg total) by mouth daily.   SYMBICORT 160-4.5 MCG/ACT inhaler Inhale 2 puffs into the lungs 2 (two) times daily as needed (shortness of breath).   tamsulosin (FLOMAX) 0.4 MG CAPS capsule Take 1 capsule (0.4 mg total) by  mouth daily.   No facility-administered encounter medications on file as of 09/08/2022.    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   Osteoporosis    Right-sided low back pain with right-sided sciatica    Seizures (HCC)    no seizures in many years   Shortness of breath dyspnea    with  exertion   Sleep apnea    last sleep study more than 5 years  uses CPAP even when napping    Past Surgical History:  Procedure Laterality Date   FRACTURE SURGERY     JOINT REPLACEMENT     kidney stones     Left leg     Rod placed   NECK SURGERY     4-5--6 and 7   SPINE SURGERY     TOTAL HIP ARTHROPLASTY  Right 07/09/2015   Procedure: RIGHT TOTAL HIP ARTHROPLASTY ANTERIOR APPROACH;  Surgeon: Kathryne Hitch, MD;  Location: [facility] OR;  Service: Orthopedics;  Laterality: Right;    Family History  Problem Relation Age of Onset   Hepatitis Mother    Alcohol abuse Mother    Depression Mother    Alzheimer's disease Father    COPD Father    Drug abuse Brother     Social History   Socioeconomic History   Marital status: Significant Other    Spouse name: Not on file   Number of children: 2   Years of education: 18   Highest education level: 12th grade  Occupational History   Not on file  Tobacco Use   Smoking status: Former    Packs/day: 2.00    Years: 45.00    Additional pack years: 0.00    Total pack years: 90.00    Types: Cigarettes    Quit date: 12/15/2017    Years since quitting: 4.7   Smokeless tobacco: Never  Vaping Use   Vaping  Use: Never used  Substance and Sexual Activity   Alcohol use: Yes    Alcohol/week: 7.0 standard drinks of alcohol    Types: 7 Shots of liquor per week    Comment: Between supper and bed time   Drug use: Yes    Types: Hydrocodone    Comment: has perscription   Sexual activity: Yes  Other Topics Concern   Not on file  Social History Narrative   Patient is not working he is trying to get disability   Patient is single    Education 12 th grade    Right handed.   Caffeine three cups daily.            Social Determinants of Health   Financial Resource Strain: Medium Risk (09/04/2022)   Overall Financial Resource Strain (CARDIA)    Difficulty of Paying Living Expenses: Somewhat hard  Food Insecurity: No Food Insecurity (09/04/2022)   Hunger Vital Sign    Worried About Running Out of Food in the Last Year: Never true    Ran Out of Food in the Last Year: Never true  Transportation Needs: No Transportation Needs (09/04/2022)   PRAPARE - Administrator, Civil Service (Medical): No    Lack of Transportation (Non-Medical): No  Physical Activity: Inactive (09/04/2022)   Exercise Vital Sign    Days of Exercise per Week: 0 days    Minutes of Exercise per Session: 0 min  Stress: Stress Concern Present (09/04/2022)   [employer] of Occupational Health - Occupational Stress Questionnaire    Feeling of Stress : To some extent  Social Connections: Socially Isolated (09/04/2022)   Social Connection and Isolation Panel [NHANES]    Frequency of Communication with Friends and Family: More than three times a week    Frequency of Social Gatherings with Friends and Family: Three times a week    Attends Religious Services: Never    Active Member of Clubs or Organizations: No    Attends Engineer, structural: Not on file    Marital Status: Divorced  Intimate Partner Violence: Not on file    Review of Systems  Neurological:  Positive for seizures.  All other systems  reviewed and are negative.       Objective    BP 125/81   Pulse 68   Temp 98.1 F (36.7 C) (Oral)   Resp 16   Wt 250 lb (113.4 kg)  SpO2 95%   BMI 38.01 kg/m   Physical Exam Vitals and nursing note reviewed.  Constitutional:      General: He is not in acute distress. Cardiovascular:     Rate and Rhythm: Normal rate and regular rhythm.  Pulmonary:     Effort: Pulmonary effort is normal.     Breath sounds: Normal breath sounds.  Abdominal:     Palpations: Abdomen is soft.     Tenderness: There is no abdominal tenderness.  Neurological:     General: No focal deficit present.     Mental Status: He is alert and oriented to person, place, and time.         Assessment & Plan:   1. Essential hypertension Appears stable. Continue   2. Gastroesophageal reflux disease without esophagitis Continue   3. Seizure disorder [facility]) Management as per consultant  4. Hyperlipidemia, unspecified hyperlipidemia type Continue   Return in about 4 months (around 01/09/2023) for follow up.   Tommie Raymond, MD

## 2022-09-11 ENCOUNTER — Encounter (HOSPITAL_COMMUNITY): Payer: Self-pay | Admitting: Neurosurgery

## 2022-09-29 DIAGNOSIS — Z79891 Long term (current) use of opiate analgesic: Secondary | ICD-10-CM | POA: Diagnosis not present

## 2022-09-29 DIAGNOSIS — M545 Low back pain, unspecified: Secondary | ICD-10-CM | POA: Diagnosis not present

## 2022-09-29 DIAGNOSIS — G894 Chronic pain syndrome: Secondary | ICD-10-CM | POA: Diagnosis not present

## 2022-09-29 DIAGNOSIS — M961 Postlaminectomy syndrome, not elsewhere classified: Secondary | ICD-10-CM | POA: Diagnosis not present

## 2022-10-07 DIAGNOSIS — G4733 Obstructive sleep apnea (adult) (pediatric): Secondary | ICD-10-CM | POA: Diagnosis not present

## 2022-10-13 ENCOUNTER — Other Ambulatory Visit: Payer: Self-pay | Admitting: Family Medicine

## 2022-10-27 DIAGNOSIS — G894 Chronic pain syndrome: Secondary | ICD-10-CM | POA: Diagnosis not present

## 2022-10-27 DIAGNOSIS — M62830 Muscle spasm of back: Secondary | ICD-10-CM | POA: Diagnosis not present

## 2022-10-27 DIAGNOSIS — Z79891 Long term (current) use of opiate analgesic: Secondary | ICD-10-CM | POA: Diagnosis not present

## 2022-10-27 DIAGNOSIS — M545 Low back pain, unspecified: Secondary | ICD-10-CM | POA: Diagnosis not present

## 2022-10-27 DIAGNOSIS — M961 Postlaminectomy syndrome, not elsewhere classified: Secondary | ICD-10-CM | POA: Diagnosis not present

## 2022-11-06 DIAGNOSIS — G4733 Obstructive sleep apnea (adult) (pediatric): Secondary | ICD-10-CM | POA: Diagnosis not present

## 2022-11-11 ENCOUNTER — Other Ambulatory Visit: Payer: Self-pay | Admitting: Family Medicine

## 2022-11-12 ENCOUNTER — Other Ambulatory Visit: Payer: Self-pay | Admitting: Family Medicine

## 2022-11-14 DIAGNOSIS — G4733 Obstructive sleep apnea (adult) (pediatric): Secondary | ICD-10-CM | POA: Diagnosis not present

## 2022-11-26 DIAGNOSIS — M545 Low back pain, unspecified: Secondary | ICD-10-CM | POA: Diagnosis not present

## 2022-11-26 DIAGNOSIS — M25552 Pain in left hip: Secondary | ICD-10-CM | POA: Diagnosis not present

## 2022-11-26 DIAGNOSIS — M62830 Muscle spasm of back: Secondary | ICD-10-CM | POA: Diagnosis not present

## 2022-11-26 DIAGNOSIS — Z79891 Long term (current) use of opiate analgesic: Secondary | ICD-10-CM | POA: Diagnosis not present

## 2022-11-26 DIAGNOSIS — G894 Chronic pain syndrome: Secondary | ICD-10-CM | POA: Diagnosis not present

## 2022-11-26 DIAGNOSIS — M961 Postlaminectomy syndrome, not elsewhere classified: Secondary | ICD-10-CM | POA: Diagnosis not present

## 2022-12-07 DIAGNOSIS — G4733 Obstructive sleep apnea (adult) (pediatric): Secondary | ICD-10-CM | POA: Diagnosis not present

## 2022-12-14 DIAGNOSIS — M4726 Other spondylosis with radiculopathy, lumbar region: Secondary | ICD-10-CM | POA: Diagnosis not present

## 2022-12-14 DIAGNOSIS — Z6837 Body mass index (BMI) 37.0-37.9, adult: Secondary | ICD-10-CM | POA: Diagnosis not present

## 2022-12-31 DIAGNOSIS — M25552 Pain in left hip: Secondary | ICD-10-CM | POA: Diagnosis not present

## 2022-12-31 DIAGNOSIS — M62830 Muscle spasm of back: Secondary | ICD-10-CM | POA: Diagnosis not present

## 2022-12-31 DIAGNOSIS — Z79891 Long term (current) use of opiate analgesic: Secondary | ICD-10-CM | POA: Diagnosis not present

## 2022-12-31 DIAGNOSIS — M961 Postlaminectomy syndrome, not elsewhere classified: Secondary | ICD-10-CM | POA: Diagnosis not present

## 2022-12-31 DIAGNOSIS — M545 Low back pain, unspecified: Secondary | ICD-10-CM | POA: Diagnosis not present

## 2022-12-31 DIAGNOSIS — G894 Chronic pain syndrome: Secondary | ICD-10-CM | POA: Diagnosis not present

## 2023-01-04 ENCOUNTER — Encounter: Payer: Self-pay | Admitting: Family Medicine

## 2023-01-04 ENCOUNTER — Ambulatory Visit (INDEPENDENT_AMBULATORY_CARE_PROVIDER_SITE_OTHER): Payer: Medicare HMO | Admitting: Family Medicine

## 2023-01-04 VITALS — BP 152/97 | HR 75 | Temp 98.0°F | Resp 18 | Ht 68.0 in | Wt 248.8 lb

## 2023-01-04 DIAGNOSIS — M5442 Lumbago with sciatica, left side: Secondary | ICD-10-CM

## 2023-01-04 DIAGNOSIS — R531 Weakness: Secondary | ICD-10-CM | POA: Diagnosis not present

## 2023-01-04 DIAGNOSIS — I10 Essential (primary) hypertension: Secondary | ICD-10-CM

## 2023-01-04 DIAGNOSIS — M1611 Unilateral primary osteoarthritis, right hip: Secondary | ICD-10-CM | POA: Diagnosis not present

## 2023-01-04 DIAGNOSIS — Z6837 Body mass index (BMI) 37.0-37.9, adult: Secondary | ICD-10-CM

## 2023-01-04 DIAGNOSIS — G8929 Other chronic pain: Secondary | ICD-10-CM | POA: Diagnosis not present

## 2023-01-04 MED ORDER — WEGOVY 0.25 MG/0.5ML ~~LOC~~ SOAJ: 0.2500 mg | SUBCUTANEOUS | 0 refills | Status: DC

## 2023-01-04 NOTE — Progress Notes (Unsigned)
Established Patient Office Visit  Subjective    Patient ID: Evan [name], male    DOB: [birth year]  Age: 67 y.o. MRN: [id number]  CC:  Chief Complaint  Patient presents with   Weight Loss    Swelling in you legs and feet    HPI Evan [name] presents for follow up of chronic med issues.He also reports that he would like to lose weight.   Outpatient Encounter Medications as of 01/04/2023  Medication Sig   albuterol (VENTOLIN HFA) 108 (90 Base) MCG/ACT inhaler Inhale 1-2 puffs into the lungs every 6 (six) hours as needed for wheezing or shortness of breath.   ALPRAZolam (XANAX) 0.5 MG tablet Take 0.5 mg by mouth at bedtime as needed for anxiety.    amLODipine (NORVASC) 10 MG tablet Take 1 tablet (10 mg total) by mouth daily.   aspirin EC 81 MG tablet Take 1 tablet (81 mg total) by mouth daily.   ergocalciferol (VITAMIN D2) 1.25 MG (50000 UT) capsule Take 50,000 Units by mouth every 21 ( twenty-one) days.   fexofenadine (ALLEGRA) 180 MG tablet Take 180 mg by mouth daily as needed for allergies.   FLUoxetine (PROZAC) 20 MG capsule Take 1 capsule (20 mg total) by mouth daily.   gabapentin (NEURONTIN) 600 MG tablet Take 600 mg by mouth 2 (two) times daily.   HYDROcodone-acetaminophen (NORCO) 7.5-325 MG tablet Take 1 tablet by mouth every 6 (six) hours as needed for moderate pain or severe pain.    losartan (COZAAR) 25 MG tablet TAKE 1 TABLET BY MOUTH AT BEDTIME   methocarbamol (ROBAXIN-750) 750 MG tablet Take 1 tablet (750 mg total) by mouth 3 (three) times daily as needed for muscle spasms.   omeprazole (PRILOSEC) 40 MG capsule Take 1 capsule (40 mg total) by mouth daily.   rosuvastatin (CRESTOR) 20 MG tablet Take 1 tablet (20 mg total) by mouth daily.   Semaglutide-Weight Management (WEGOVY) 0.25 MG/0.5ML SOAJ Inject 0.25 mg into the skin once a week.   SYMBICORT 160-4.5 MCG/ACT inhaler Inhale 2 puffs into the lungs 2 (two) times daily as needed (shortness of breath).    tamsulosin (FLOMAX) 0.4 MG CAPS capsule Take 1 capsule (0.4 mg total) by mouth daily.   No facility-administered encounter medications on file as of 01/04/2023.    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   Osteoporosis    Right-sided low back pain with right-sided sciatica    Seizures (HCC)    no seizures in many years   Shortness of breath dyspnea    with  exertion   Sleep apnea    last sleep study more than 5 years  uses CPAP even when napping    Past Surgical History:  Procedure Laterality Date   FRACTURE SURGERY     JOINT REPLACEMENT     kidney stones     Left leg     Rod placed  NECK SURGERY     4-5--6 and 7   SPINE SURGERY     TOTAL HIP ARTHROPLASTY Right 07/09/2015   Procedure: RIGHT TOTAL HIP ARTHROPLASTY ANTERIOR APPROACH;  Surgeon: Kathryne Hitch, MD;  Location: [facility] OR;  Service: Orthopedics;  Laterality: Right;    Family History  Problem Relation Age of Onset   Hepatitis Mother    Alcohol abuse Mother    Depression Mother    Alzheimer's disease Father    COPD Father    Drug abuse Brother     Social History   Socioeconomic History   Marital status: Significant Other    Spouse name: Not on file   Number of children: 2   Years of education: 1   Highest education level: 12th grade  Occupational History   Not on file  Tobacco Use   Smoking status: Former    Current packs/day: 0.00    Average packs/day: 2.0 packs/day for 45.0 years (90.0 ttl pk-yrs)    Types: Cigarettes    Start date: 12/15/1972    Quit  date: 12/15/2017    Years since quitting: 5.0   Smokeless tobacco: Never  Vaping Use   Vaping status: Never Used  Substance and Sexual Activity   Alcohol use: Yes    Alcohol/week: 7.0 standard drinks of alcohol    Types: 7 Shots of liquor per week    Comment: Between supper and bed time   Drug use: Yes    Types: Hydrocodone    Comment: has perscription   Sexual activity: Yes  Other Topics Concern   Not on file  Social History Narrative   Patient is not working he is trying to get disability   Patient is single    Education 12 th grade    Right handed.   Caffeine three cups daily.            Social Determinants of Health   Financial Resource Strain: Medium Risk (09/04/2022)   Overall Financial Resource Strain (CARDIA)    Difficulty of Paying Living Expenses: Somewhat hard  Food Insecurity: No Food Insecurity (09/04/2022)   Hunger Vital Sign    Worried About Running Out of Food in the Last Year: Never true    Ran Out of Food in the Last Year: Never true  Transportation Needs: No Transportation Needs (09/04/2022)   PRAPARE - Administrator, Civil Service (Medical): No    Lack of Transportation (Non-Medical): No  Physical Activity: Inactive (09/04/2022)   Exercise Vital Sign    Days of Exercise per Week: 0 days    Minutes of Exercise per Session: 0 min  Stress: No Stress Concern Present (01/04/2023)   [employer] of Occupational Health - Occupational Stress Questionnaire    Feeling of Stress : Only a little  Social Connections: Socially Isolated (09/04/2022)   Social Connection and Isolation Panel [NHANES]    Frequency of Communication with Friends and Family: More than three times a week    Frequency of Social Gatherings with Friends and Family: Three times a week    Attends Religious Services: Never    Active Member of Clubs or Organizations: No    Attends Engineer, structural: Not on file    Marital Status: Divorced  Intimate Partner Violence:  Not on file    Review of Systems  All other systems reviewed and are negative.       Objective    BP (!) 152/97 (BP Location: Right Arm, Patient Position:  Sitting, Cuff Size: Normal)   Pulse 75   Temp 98 F (36.7 C) (Oral)   Resp 18   Ht 5\' 8"  (1.727 m)   Wt 248 lb 12.8 oz (112.9 kg)   SpO2 93%   BMI 37.83 kg/m   Physical Exam Vitals and nursing note reviewed.  Constitutional:      General: He is not in acute distress. Cardiovascular:     Rate and Rhythm: Normal rate and regular rhythm.  Pulmonary:     Effort: Pulmonary effort is normal.     Breath sounds: Normal breath sounds.  Abdominal:     Palpations: Abdomen is soft.     Tenderness: There is no abdominal tenderness.  Musculoskeletal:     Comments: Utilizing cane for stsability  Neurological:     General: No focal deficit present.     Mental Status: He is alert and oriented to person, place, and time.     {Labs (Optional):23779}    Assessment & Plan:   Essential hypertension  Class 2 severe obesity due to excess calories with serious comorbidity and body mass index (BMI) of 37.0 to 37.9 in adult [facility])  Chronic right-sided low back pain with left-sided sciatica  Left-sided weakness  Primary osteoarthritis of right hip  Other orders -     JYNWGN; Inject 0.25 mg into the skin once a week.  Dispense: 2 mL; Refill: 0     No follow-ups on file.   Tommie Raymond, MD

## 2023-01-04 NOTE — Progress Notes (Unsigned)
Weight loss.  Swelling in feet and legs

## 2023-01-06 ENCOUNTER — Encounter: Payer: Self-pay | Admitting: Family Medicine

## 2023-01-07 DIAGNOSIS — G4733 Obstructive sleep apnea (adult) (pediatric): Secondary | ICD-10-CM | POA: Diagnosis not present

## 2023-01-27 DIAGNOSIS — Z79891 Long term (current) use of opiate analgesic: Secondary | ICD-10-CM | POA: Diagnosis not present

## 2023-01-27 DIAGNOSIS — M961 Postlaminectomy syndrome, not elsewhere classified: Secondary | ICD-10-CM | POA: Diagnosis not present

## 2023-01-27 DIAGNOSIS — M545 Low back pain, unspecified: Secondary | ICD-10-CM | POA: Diagnosis not present

## 2023-01-27 DIAGNOSIS — M62830 Muscle spasm of back: Secondary | ICD-10-CM | POA: Diagnosis not present

## 2023-01-27 DIAGNOSIS — M25552 Pain in left hip: Secondary | ICD-10-CM | POA: Diagnosis not present

## 2023-01-27 DIAGNOSIS — G894 Chronic pain syndrome: Secondary | ICD-10-CM | POA: Diagnosis not present

## 2023-01-28 ENCOUNTER — Other Ambulatory Visit: Payer: Self-pay | Admitting: Family Medicine

## 2023-02-06 DIAGNOSIS — G4733 Obstructive sleep apnea (adult) (pediatric): Secondary | ICD-10-CM | POA: Diagnosis not present

## 2023-02-15 ENCOUNTER — Ambulatory Visit: Payer: Medicare HMO | Admitting: Family Medicine

## 2023-02-15 DIAGNOSIS — G4733 Obstructive sleep apnea (adult) (pediatric): Secondary | ICD-10-CM | POA: Diagnosis not present

## 2023-02-25 ENCOUNTER — Other Ambulatory Visit: Payer: Self-pay | Admitting: Family Medicine

## 2023-03-02 ENCOUNTER — Other Ambulatory Visit: Payer: Self-pay | Admitting: Family Medicine

## 2023-03-03 DIAGNOSIS — M545 Low back pain, unspecified: Secondary | ICD-10-CM | POA: Diagnosis not present

## 2023-03-03 DIAGNOSIS — G894 Chronic pain syndrome: Secondary | ICD-10-CM | POA: Diagnosis not present

## 2023-03-03 DIAGNOSIS — M961 Postlaminectomy syndrome, not elsewhere classified: Secondary | ICD-10-CM | POA: Diagnosis not present

## 2023-03-03 DIAGNOSIS — Z79891 Long term (current) use of opiate analgesic: Secondary | ICD-10-CM | POA: Diagnosis not present

## 2023-03-09 DIAGNOSIS — G4733 Obstructive sleep apnea (adult) (pediatric): Secondary | ICD-10-CM | POA: Diagnosis not present

## 2023-03-10 ENCOUNTER — Other Ambulatory Visit: Payer: Self-pay | Admitting: Family Medicine

## 2023-03-10 ENCOUNTER — Encounter: Payer: Self-pay | Admitting: Family Medicine

## 2023-03-10 ENCOUNTER — Ambulatory Visit: Payer: Medicare HMO | Admitting: Family Medicine

## 2023-03-10 VITALS — BP 149/91 | HR 70 | Temp 97.7°F | Resp 18 | Ht 68.0 in | Wt 255.2 lb

## 2023-03-10 DIAGNOSIS — Z7689 Persons encountering health services in other specified circumstances: Secondary | ICD-10-CM | POA: Diagnosis not present

## 2023-03-10 DIAGNOSIS — I10 Essential (primary) hypertension: Secondary | ICD-10-CM | POA: Diagnosis not present

## 2023-03-10 DIAGNOSIS — Z23 Encounter for immunization: Secondary | ICD-10-CM

## 2023-03-10 DIAGNOSIS — E66812 Obesity, class 2: Secondary | ICD-10-CM

## 2023-03-10 DIAGNOSIS — R7303 Prediabetes: Secondary | ICD-10-CM

## 2023-03-10 DIAGNOSIS — Z6838 Body mass index (BMI) 38.0-38.9, adult: Secondary | ICD-10-CM

## 2023-03-10 LAB — POCT GLYCOSYLATED HEMOGLOBIN (HGB A1C): Hemoglobin A1C: 6.2 % — AB (ref 4.0–5.6)

## 2023-03-10 MED ORDER — WEGOVY 0.25 MG/0.5ML ~~LOC~~ SOAJ: 0.2500 mg | SUBCUTANEOUS | 0 refills | Status: DC

## 2023-03-11 ENCOUNTER — Ambulatory Visit: Payer: Medicare HMO | Admitting: Family Medicine

## 2023-03-15 ENCOUNTER — Encounter: Payer: Self-pay | Admitting: Family Medicine

## 2023-03-15 NOTE — Progress Notes (Unsigned)
Established Patient Office Visit  Subjective    Patient ID: Evan [name], male    DOB: September 26, 1955  Age: 67 y.o. MRN: [id number]  CC:  Chief Complaint  Patient presents with   Follow-up    Weight loss    HPI [name] presents for weight managemenet and for follow up of chronic med issues including hypertension. Patient reports that she   Outpatient Encounter Medications as of 03/10/2023  Medication Sig   albuterol (VENTOLIN HFA) 108 (90 Base) MCG/ACT inhaler Inhale 1-2 puffs into the lungs every 6 (six) hours as needed for wheezing or shortness of breath.   ALPRAZolam (XANAX) 0.5 MG tablet Take 0.5 mg by mouth at bedtime as needed for anxiety.    amLODipine (NORVASC) 10 MG tablet Take 1 tablet (10 mg total) by mouth daily.   aspirin EC 81 MG tablet Take 1 tablet (81 mg total) by mouth daily.   ergocalciferol (VITAMIN D2) 1.25 MG (50000 UT) capsule Take 50,000 Units by mouth every 21 ( twenty-one) days.   fexofenadine (ALLEGRA) 180 MG tablet Take 180 mg by mouth daily as needed for allergies.   FLUoxetine (PROZAC) 20 MG capsule Take 1 capsule (20 mg total) by mouth daily.   gabapentin (NEURONTIN) 600 MG tablet Take 600 mg by mouth 2 (two) times daily.   HYDROcodone-acetaminophen (NORCO) 7.5-325 MG tablet Take 1 tablet by mouth every 6 (six) hours as needed for moderate pain or severe pain.    losartan (COZAAR) 25 MG tablet TAKE 1 TABLET BY MOUTH AT BEDTIME   methocarbamol (ROBAXIN-750) 750 MG tablet Take 1 tablet (750 mg total) by mouth 3 (three) times daily as needed for muscle spasms.   omeprazole (PRILOSEC) 40 MG capsule Take 1 capsule (40 mg total) by mouth daily.   rosuvastatin (CRESTOR) 20 MG tablet Take 1 tablet (20 mg total) by mouth daily.   SYMBICORT 160-4.5 MCG/ACT inhaler Inhale 2 puffs into the lungs 2 (two) times daily as needed (shortness of breath).   [DISCONTINUED] Semaglutide-Weight Management (WEGOVY) 0.25 MG/0.5ML SOAJ Inject 0.25 mg into the  skin once a week.   [DISCONTINUED] tamsulosin (FLOMAX) 0.4 MG CAPS capsule Take 1 capsule (0.4 mg total) by mouth daily.   Semaglutide-Weight Management (WEGOVY) 0.25 MG/0.5ML SOAJ Inject 0.25 mg into the skin once a week.   No facility-administered encounter medications on file as of 03/10/2023.    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   Osteoporosis    Right-sided low back pain with right-sided sciatica    Seizures (HCC)    no seizures in many years   Shortness of breath dyspnea    with  exertion   Sleep apnea    last sleep study more than 5 years  uses CPAP even when napping    Past Surgical History:  Procedure Laterality Date   FRACTURE SURGERY     JOINT REPLACEMENT     kidney  stones     Left leg     Rod placed   NECK SURGERY     4-5--6 and 7   SPINE SURGERY     TOTAL HIP ARTHROPLASTY Right 07/09/2015   Procedure: RIGHT TOTAL HIP ARTHROPLASTY ANTERIOR APPROACH;  Surgeon: Kathryne Hitch, MD;  Location: [facility];  Service: Orthopedics;  Laterality: Right;    Family History  Problem Relation Age of Onset   Hepatitis Mother    Alcohol abuse Mother    Depression Mother    Alzheimer's disease Father    COPD Father    Drug abuse Brother     Social History   Socioeconomic History   Marital status: Significant Other    Spouse name: Not on file   Number of children: 2   Years of education: 77   Highest education level: 12th grade  Occupational History   Not on file  Tobacco Use   Smoking status: Former    Current  packs/day: 0.00    Average packs/day: 2.0 packs/day for 45.0 years (90.0 ttl pk-yrs)    Types: Cigarettes    Start date: 12/15/1972    Quit date: 12/15/2017    Years since quitting: 5.2   Smokeless tobacco: Never  Vaping Use   Vaping status: Never Used  Substance and Sexual Activity   Alcohol use: Yes    Alcohol/week: 7.0 standard drinks of alcohol    Types: 7 Shots of liquor per week    Comment: Between supper and bed time   Drug use: Yes    Types: Hydrocodone    Comment: has perscription   Sexual activity: Yes  Other Topics Concern   Not on file  Social History Narrative   Patient is not working he is trying to get disability   Patient is single    Education 12 th grade    Right handed.   Caffeine three cups daily.            Social Determinants of Health   Financial Resource Strain: Medium Risk (03/06/2023)   Overall Financial Resource Strain (CARDIA)    Difficulty of Paying Living Expenses: Somewhat hard  Food Insecurity: Food Insecurity Present (03/06/2023)   Hunger Vital Sign    Worried About Running Out of Food in the Last Year: Sometimes true    Ran Out of Food in the Last Year: Never true  Transportation Needs: No Transportation Needs (03/06/2023)   PRAPARE - Administrator, Civil Service (Medical): No    Lack of Transportation (Non-Medical): No  Physical Activity: Inactive (03/06/2023)   Exercise Vital Sign    Days of Exercise per Week: 1 day    Minutes of Exercise per Session: 0 min  Stress: Stress Concern Present (03/06/2023)   [employer] of Occupational Health - Occupational Stress Questionnaire    Feeling of Stress : To some extent  Social Connections: Socially Isolated (03/06/2023)   Social Connection and Isolation Panel [NHANES]    Frequency of Communication with Friends and Family: Three times a week    Frequency of Social Gatherings with Friends and Family: Once a week    Attends Religious Services: Never    Database administrator or  Organizations: No    Attends Engineer, structural: Not on file    Marital Status: Divorced  Catering manager Violence: Not on file    ROS      Objective    BP (!) 149/91 (BP Location: Right Arm, Patient Position:  Sitting, Cuff Size: Normal)   Pulse 70   Temp 97.7 F (36.5 C) (Oral)   Resp 18   Ht 5\' 8"  (1.727 m)   Wt 255 lb 3.2 oz (115.8 kg)   SpO2 94%   BMI 38.80 kg/m   Physical Exam  {Labs (Optional):23779}    Assessment & Plan:   Essential hypertension  Prediabetes -     ZOXWRU; Inject 0.25 mg into the skin once a week.  Dispense: 2 mL; Refill: 0 -     POCT glycosylated hemoglobin (Hb A1C)  Encounter for weight management  Class 2 severe obesity due to excess calories with serious comorbidity and body mass index (BMI) of 38.0 to 38.9 in adult [facility])  Encounter for immunization -     Flu Vaccine Trivalent High Dose (Fluad)     No follow-ups on file.   Tommie Raymond, MD

## 2023-04-08 DIAGNOSIS — G894 Chronic pain syndrome: Secondary | ICD-10-CM | POA: Diagnosis not present

## 2023-04-08 DIAGNOSIS — Z79891 Long term (current) use of opiate analgesic: Secondary | ICD-10-CM | POA: Diagnosis not present

## 2023-04-08 DIAGNOSIS — M961 Postlaminectomy syndrome, not elsewhere classified: Secondary | ICD-10-CM | POA: Diagnosis not present

## 2023-04-08 DIAGNOSIS — M545 Low back pain, unspecified: Secondary | ICD-10-CM | POA: Diagnosis not present

## 2023-04-12 ENCOUNTER — Other Ambulatory Visit: Payer: Self-pay | Admitting: Family Medicine

## 2023-04-13 ENCOUNTER — Other Ambulatory Visit: Payer: Self-pay | Admitting: Family Medicine

## 2023-04-13 DIAGNOSIS — R7303 Prediabetes: Secondary | ICD-10-CM

## 2023-04-13 MED ORDER — WEGOVY 0.25 MG/0.5ML ~~LOC~~ SOAJ: 0.2500 mg | SUBCUTANEOUS | 0 refills | Status: DC

## 2023-04-13 NOTE — Telephone Encounter (Signed)
Medication Refill -  Most Recent Primary Care Visit:  Provider: Georganna Skeans  Department: [facility]  Visit Type: PHYSICAL  Date: 03/10/2023  Medication: Semaglutide-Weight Management (WEGOVY) 0.25 MG/0.5ML SOAJ   The patient is requesting a medication transfer to the pharmacy below.  Has the patient contacted their pharmacy? Yes  (Agent: If yes, when and what did the pharmacy advise?)  Is this the correct pharmacy for this prescription? Yes If no, delete pharmacy and type the correct one.  This is the patient's preferred pharmacy:   [id number] - [place], [place] - [address]  [address] [place] [address]  Phone: [phone] Fax: [phone]  Hours: Not open 24 hours    Has the prescription been filled recently? Yes  Is the patient out of the medication? Yes  Has the patient been seen for an appointment in the last year OR does the patient have an upcoming appointment? Yes  Can we respond through MyChart? Yes  Agent: Please be advised that Rx refills may take up to 3 business days. We ask that you follow-up with your pharmacy.

## 2023-04-13 NOTE — Telephone Encounter (Signed)
Requested Prescriptions  Pending Prescriptions Disp Refills   Semaglutide-Weight Management (WEGOVY) 0.25 MG/0.5ML SOAJ 2 mL 0    Sig: Inject 0.25 mg into the skin once a week.     Endocrinology:  Diabetes - GLP-1 Receptor Agonists - semaglutide Failed - 04/13/2023  4:57 PM      Failed - HBA1C in normal range and within 180 days    Hemoglobin A1C  Date Value Ref Range Status  03/10/2023 6.2 (A) 4.0 - 5.6 % Final   Hgb A1c MFr Bld  Date Value Ref Range Status  11/13/2021 6.2 (H) 4.8 - 5.6 % Final    Comment:             Prediabetes: 5.7 - 6.4          Diabetes: >6.4          Glycemic control for adults with diabetes: <7.0          Failed - Cr in normal range and within 360 days    Creatinine, Ser  Date Value Ref Range Status  07/15/2022 1.30 (H) 0.61 - 1.24 mg/dL Final         Passed - Valid encounter within last 6 months    Recent Outpatient Visits           1 month ago Essential hypertension   [place] [facility], MD   3 months ago Essential hypertension   [place] [facility], MD   7 months ago Essential hypertension   [place] [facility], MD   10 months ago Annual physical exam   [place] [facility], MD   1 year ago OSA (obstructive sleep apnea)   [place] [facility], MD       Future Appointments             In 1 month Georganna Skeans, MD [facility] at [facility]

## 2023-05-04 ENCOUNTER — Other Ambulatory Visit: Payer: Self-pay | Admitting: Family Medicine

## 2023-05-05 ENCOUNTER — Other Ambulatory Visit: Payer: Self-pay | Admitting: Family Medicine

## 2023-05-11 DIAGNOSIS — G894 Chronic pain syndrome: Secondary | ICD-10-CM | POA: Diagnosis not present

## 2023-05-11 DIAGNOSIS — Z79891 Long term (current) use of opiate analgesic: Secondary | ICD-10-CM | POA: Diagnosis not present

## 2023-05-11 DIAGNOSIS — M545 Low back pain, unspecified: Secondary | ICD-10-CM | POA: Diagnosis not present

## 2023-05-11 DIAGNOSIS — M961 Postlaminectomy syndrome, not elsewhere classified: Secondary | ICD-10-CM | POA: Diagnosis not present

## 2023-06-02 DIAGNOSIS — G4733 Obstructive sleep apnea (adult) (pediatric): Secondary | ICD-10-CM | POA: Diagnosis not present

## 2023-06-03 ENCOUNTER — Telehealth: Payer: Self-pay

## 2023-06-03 DIAGNOSIS — Z87891 Personal history of nicotine dependence: Secondary | ICD-10-CM

## 2023-06-03 DIAGNOSIS — Z122 Encounter for screening for malignant neoplasm of respiratory organs: Secondary | ICD-10-CM

## 2023-06-03 NOTE — Telephone Encounter (Signed)
.  Lung Cancer Screening Narrative/Criteria Questionnaire (Cigarette Smokers Only- No Cigars/Pipes/vapes)   Evan [name]   SDMV:06/18/2023 at 10:00am with Evan Isaiah [name], RN   1956-03-24   LDCT: 06/23/2023 at 9:40am at [facility]    68 y.o.   Phone: [phone]  Lung Screening Narrative (confirm age 73-77 yrs Medicare / 50-80 yrs Private pay insurance)   Insurance information: Medicare   Referring Provider:Wilson, MD   This screening involves an initial phone call with a team member from our program. It is called a shared decision making visit. The initial meeting is required by  insurance and Medicare to make sure you understand the program. This appointment takes about 15-20 minutes to complete. You will complete the screening scan at your scheduled date/time.  This scan takes about 5-10 minutes to complete. You can eat and drink normally before and after the scan.  Criteria questions for Lung Cancer Screening:   Are you a current or former smoker? Former Age began smoking: 15   If you are a former smoker, what year did you quit smoking? Quit 2021 (within 15 yrs)   To calculate your smoking history, I need an accurate estimate of how many packs of cigarettes you smoked per day and for how many years. (Not just the number of PPD you are now smoking)   Years smoking 49 x Packs per day 2.5 = Pack years 122.5   (at least 20 pack yrs)   (Make sure they understand that we need to know how much they have smoked in the past, not just the number of PPD they are smoking now)  Do you have a personal history of cancer?  No    Do you have a family history of cancer? Yes Father lung and prostate  Grandfather Prostate  Are you coughing up blood?  No  Have you had unexplained weight loss of 15 lbs or more in the last 6 months? No  It looks like you meet all criteria.  When would be a good time for us  to schedule you for this screening?   Additional information: N/A

## 2023-06-07 ENCOUNTER — Other Ambulatory Visit: Payer: Self-pay | Admitting: Family Medicine

## 2023-06-08 DIAGNOSIS — G894 Chronic pain syndrome: Secondary | ICD-10-CM | POA: Diagnosis not present

## 2023-06-08 DIAGNOSIS — M545 Low back pain, unspecified: Secondary | ICD-10-CM | POA: Diagnosis not present

## 2023-06-08 DIAGNOSIS — M961 Postlaminectomy syndrome, not elsewhere classified: Secondary | ICD-10-CM | POA: Diagnosis not present

## 2023-06-08 DIAGNOSIS — Z79891 Long term (current) use of opiate analgesic: Secondary | ICD-10-CM | POA: Diagnosis not present

## 2023-06-09 ENCOUNTER — Encounter: Payer: Self-pay | Admitting: Family Medicine

## 2023-06-09 ENCOUNTER — Ambulatory Visit (INDEPENDENT_AMBULATORY_CARE_PROVIDER_SITE_OTHER): Payer: Medicare HMO | Admitting: Family Medicine

## 2023-06-09 VITALS — BP 153/86 | HR 74 | Temp 97.6°F | Resp 18 | Ht 68.0 in | Wt 251.2 lb

## 2023-06-09 DIAGNOSIS — Z130 Encounter for screening for diseases of the blood and blood-forming organs and certain disorders involving the immune mechanism: Secondary | ICD-10-CM | POA: Diagnosis not present

## 2023-06-09 DIAGNOSIS — Z13220 Encounter for screening for lipoid disorders: Secondary | ICD-10-CM | POA: Diagnosis not present

## 2023-06-09 DIAGNOSIS — Z0000 Encounter for general adult medical examination without abnormal findings: Secondary | ICD-10-CM

## 2023-06-09 DIAGNOSIS — Z1159 Encounter for screening for other viral diseases: Secondary | ICD-10-CM | POA: Diagnosis not present

## 2023-06-10 ENCOUNTER — Encounter: Payer: Self-pay | Admitting: Family Medicine

## 2023-06-10 ENCOUNTER — Other Ambulatory Visit: Payer: Self-pay | Admitting: Family Medicine

## 2023-06-10 LAB — CBC WITH DIFFERENTIAL/PLATELET
Basophils Absolute: 0.1 x10E3/uL (ref 0.0–0.2)
Basos: 1 %
EOS (ABSOLUTE): 0.2 x10E3/uL (ref 0.0–0.4)
Eos: 3 %
Hematocrit: 47.4 % (ref 37.5–51.0)
Hemoglobin: 14.7 g/dL (ref 13.0–17.7)
Immature Grans (Abs): 0 x10E3/uL (ref 0.0–0.1)
Immature Granulocytes: 0 %
Lymphocytes Absolute: 1.7 x10E3/uL (ref 0.7–3.1)
Lymphs: 23 %
MCH: 26.5 pg — ABNORMAL LOW (ref 26.6–33.0)
MCHC: 31 g/dL — ABNORMAL LOW (ref 31.5–35.7)
MCV: 85 fL (ref 79–97)
Monocytes Absolute: 0.4 x10E3/uL (ref 0.1–0.9)
Monocytes: 6 %
Neutrophils Absolute: 5 x10E3/uL (ref 1.4–7.0)
Neutrophils: 67 %
Platelets: 241 x10E3/uL (ref 150–450)
RBC: 5.55 x10E6/uL (ref 4.14–5.80)
RDW: 13.2 % (ref 11.6–15.4)
WBC: 7.5 x10E3/uL (ref 3.4–10.8)

## 2023-06-10 LAB — LIPID PANEL
Chol/HDL Ratio: 2.7 {ratio} (ref 0.0–5.0)
Cholesterol, Total: 131 mg/dL (ref 100–199)
HDL: 48 mg/dL (ref >39)
LDL Chol Calc (NIH): 64 mg/dL (ref 0–99)
Triglycerides: 104 mg/dL (ref 0–149)
VLDL Cholesterol Cal: 19 mg/dL (ref 5–40)

## 2023-06-10 LAB — CMP14+EGFR
ALT: 32 IU/L (ref 0–44)
AST: 34 IU/L (ref 0–40)
Albumin: 4.5 g/dL (ref 3.9–4.9)
Alkaline Phosphatase: 94 IU/L (ref 44–121)
BUN/Creatinine Ratio: 13 (ref 10–24)
BUN: 14 mg/dL (ref 8–27)
Bilirubin Total: 0.3 mg/dL (ref 0.0–1.2)
CO2: 22 mmol/L (ref 20–29)
Calcium: 9.6 mg/dL (ref 8.6–10.2)
Chloride: 103 mmol/L (ref 96–106)
Creatinine, Ser: 1.1 mg/dL (ref 0.76–1.27)
Globulin, Total: 2.9 g/dL (ref 1.5–4.5)
Glucose: 102 mg/dL — ABNORMAL HIGH (ref 70–99)
Potassium: 4.3 mmol/L (ref 3.5–5.2)
Sodium: 141 mmol/L (ref 134–144)
Total Protein: 7.4 g/dL (ref 6.0–8.5)
eGFR: 73 mL/min/1.73

## 2023-06-10 LAB — HEPATITIS C ANTIBODY: Hep C Virus Ab: NONREACTIVE

## 2023-06-10 NOTE — Progress Notes (Signed)
Established Patient Office Visit  Subjective    Patient ID: Evan [name], male    DOB: January 04, 1956  Age: 68 y.o. MRN: [id number]  CC:  Chief Complaint  Patient presents with   Annual Exam    HPI [name] presents for routine annual exam. Patient denies acute complaints or concerns.   Outpatient Encounter Medications as of 06/09/2023  Medication Sig   albuterol (VENTOLIN HFA) 108 (90 Base) MCG/ACT inhaler Inhale 1-2 puffs into the lungs every 6 (six) hours as needed for wheezing or shortness of breath.   ALPRAZolam (XANAX) 0.5 MG tablet Take 0.5 mg by mouth at bedtime as needed for anxiety.    amLODipine (NORVASC) 10 MG tablet Take 1 tablet (10 mg total) by mouth daily.   aspirin EC 81 MG tablet Take 1 tablet (81 mg total) by mouth daily.   ergocalciferol (VITAMIN D2) 1.25 MG (50000 UT) capsule Take 50,000 Units by mouth every 21 ( twenty-one) days.   fexofenadine (ALLEGRA) 180 MG tablet Take 180 mg by mouth daily as needed for allergies.   FLUoxetine (PROZAC) 20 MG capsule Take 1 capsule (20 mg total) by mouth daily.   gabapentin (NEURONTIN) 600 MG tablet Take 600 mg by mouth 2 (two) times daily.   HYDROcodone-acetaminophen (NORCO) 7.5-325 MG tablet Take 1 tablet by mouth every 6 (six) hours as needed for moderate pain or severe pain.    losartan (COZAAR) 25 MG tablet TAKE 1 TABLET BY MOUTH AT BEDTIME   methocarbamol (ROBAXIN-750) 750 MG tablet Take 1 tablet (750 mg total) by mouth 3 (three) times daily as needed for muscle spasms.   omeprazole (PRILOSEC) 40 MG capsule Take 1 capsule (40 mg total) by mouth daily.   rosuvastatin (CRESTOR) 20 MG tablet Take 1 tablet (20 mg total) by mouth daily.   Semaglutide-Weight Management (WEGOVY) 0.25 MG/0.5ML SOAJ Inject 0.25 mg into the skin once a week.   SYMBICORT 160-4.5 MCG/ACT inhaler INHALE 2 PUFFS TWICE DAILY AS DIRECTED   tamsulosin (FLOMAX) 0.4 MG CAPS capsule Take 1 capsule (0.4 mg total) by mouth daily.   No  facility-administered encounter medications on file as of 06/09/2023.    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   Osteoporosis    Right-sided low back pain with right-sided sciatica    Seizures (HCC)    no seizures in many years   Shortness of breath dyspnea    with  exertion   Sleep apnea    last sleep study more than 5 years  uses CPAP even when napping    Past Surgical History:  Procedure Laterality Date   FRACTURE SURGERY     JOINT REPLACEMENT     kidney stones     Left leg     Rod placed   NECK SURGERY     4-5--6 and 7   SPINE SURGERY     TOTAL HIP ARTHROPLASTY Right 07/09/2015  Procedure: RIGHT TOTAL HIP ARTHROPLASTY ANTERIOR APPROACH;  Surgeon: Kathryne Hitch, MD;  Location[facility];  Service: Orthopedics;  Laterality: Right;    Family History  Problem Relation Age of Onset   Hepatitis Mother    Alcohol abuse Mother    Depression Mother    Alzheimer's disease Father    COPD Father    Drug abuse Brother     Social History   Socioeconomic History   Marital status: Significant Other    Spouse name: Not on file   Number of children: 2   Years of education: 47   Highest education level: 12th grade  Occupational History   Not on file  Tobacco Use   Smoking status: Former    Current packs/day: 0.00    Average packs/day: 2.0 packs/day for 45.0 years (90.0 ttl pk-yrs)    Types: Cigarettes    Start date: 12/15/1972    Quit date: 12/15/2017    Years since quitting: 5.4   Smokeless tobacco: Never  Vaping Use    Vaping status: Never Used  Substance and Sexual Activity   Alcohol use: Yes    Alcohol/week: 7.0 standard drinks of alcohol    Types: 7 Shots of liquor per week    Comment: Between supper and bed time   Drug use: Yes    Types: Hydrocodone    Comment: has perscription   Sexual activity: Yes  Other Topics Concern   Not on file  Social History Narrative   Patient is not working he is trying to get disability   Patient is single    Education 12 th grade    Right handed.   Caffeine three cups daily.            Social Drivers of Health   Financial Resource Strain: Medium Risk (06/05/2023)   Overall Financial Resource Strain (CARDIA)    Difficulty of Paying Living Expenses: Somewhat hard  Food Insecurity: Patient Declined (06/05/2023)   Hunger Vital Sign    Worried About Running Out of Food in the Last Year: Patient declined    Ran Out of Food in the Last Year: Patient declined  Transportation Needs: No Transportation Needs (06/05/2023)   PRAPARE - Administrator, Civil Service (Medical): No    Lack of Transportation (Non-Medical): No  Physical Activity: Inactive (06/05/2023)   Exercise Vital Sign    Days of Exercise per Week: 0 days    Minutes of Exercise per Session: 0 min  Stress: Stress Concern Present (06/05/2023)   [employer] of Occupational Health - Occupational Stress Questionnaire    Feeling of Stress : To some extent  Social Connections: Unknown (06/05/2023)   Social Connection and Isolation Panel [NHANES]    Frequency of Communication with Friends and Family: Three times a week    Frequency of Social Gatherings with Friends and Family: Three times a week    Attends Religious Services: Patient declined    Active Member of Clubs or Organizations: No    Attends Banker Meetings: Not on file    Marital Status: Divorced  Intimate Partner Violence: Not At Risk (06/09/2023)   Humiliation, Afraid, Rape, and Kick questionnaire    Fear of Current or  Ex-Partner: No    Emotionally Abused: No    Physically Abused: No    Sexually Abused: No    Review of Systems  All other systems reviewed and are negative.       Objective    BP Marland Kitchen)  153/86 (BP Location: Right Arm, Patient Position: Sitting, Cuff Size: Normal)   Pulse 74   Temp 97.6 F (36.4 C) (Oral)   Resp 18   Ht 5\' 8"  (1.727 m)   Wt 251 lb 3.2 oz (113.9 kg)   SpO2 93%   BMI 38.19 kg/m   Physical Exam Vitals and nursing note reviewed.  Constitutional:      General: He is not in acute distress. HENT:     Head: Normocephalic and atraumatic.     Right Ear: Tympanic membrane, ear canal and external ear normal.     Left Ear: Tympanic membrane, ear canal and external ear normal.     Nose: Nose normal.     Mouth/Throat:     Mouth: Mucous membranes are moist.     Pharynx: Oropharynx is clear.  Eyes:     Conjunctiva/sclera: Conjunctivae normal.     Pupils: Pupils are equal, round, and reactive to light.  Neck:     Thyroid: No thyromegaly.  Cardiovascular:     Rate and Rhythm: Normal rate and regular rhythm.     Heart sounds: Normal heart sounds. No murmur heard. Pulmonary:     Effort: Pulmonary effort is normal.     Breath sounds: Normal breath sounds.  Abdominal:     General: There is no distension.     Palpations: Abdomen is soft. There is no mass.     Tenderness: There is no abdominal tenderness.     Hernia: There is no hernia in the left inguinal area or right inguinal area.  Musculoskeletal:        General: Normal range of motion.     Cervical back: Normal range of motion and neck supple.     Right lower leg: No edema.     Left lower leg: No edema.  Skin:    General: Skin is warm and dry.  Neurological:     General: No focal deficit present.     Mental Status: He is alert and oriented to person, place, and time. Mental status is at baseline.  Psychiatric:        Mood and Affect: Mood normal.        Behavior: Behavior normal.         Assessment &  Plan:   Annual physical exam -     CMP14+EGFR  Screening for deficiency anemia -     CBC with Differential/Platelet  Screening for lipid disorders -     Lipid panel  Need for hepatitis C screening test -     Hepatitis C antibody     No follow-ups on file.   Tommie Raymond, MD

## 2023-06-10 NOTE — Telephone Encounter (Signed)
Requested medication (s) are due for refill today: yes   Requested medication (s) are on the active medication list: yes   Last refill:  prilosec- 02/25/23 #90 0 refills prozac- 03/04/23 #90 0 refills   Future visit scheduled: yes in 12 months   Notes to clinic:  no refills remain. Do you want to refill Rxs?     Requested Prescriptions  Pending Prescriptions Disp Refills   omeprazole (PRILOSEC) 40 MG capsule 90 capsule 0    Sig: Take 1 capsule (40 mg total) by mouth daily.     Gastroenterology: Proton Pump Inhibitors Passed - 06/10/2023  4:29 PM      Passed - Valid encounter within last 12 months    Recent Outpatient Visits           Yesterday Annual physical exam   [place] [facility] [place], MD   3 months ago Essential hypertension   [place] [facility], MD   5 months ago Essential hypertension   [place] [facility], MD   9 months ago Essential hypertension   [place] [facility], MD   1 year ago Annual physical exam   [place] [facility], MD       Future Appointments             In 12 months Georganna Skeans, MD [facility]             FLUoxetine (PROZAC) 20 MG capsule 90 capsule 0    Sig: Take 1 capsule (20 mg total) by mouth daily.     Psychiatry:  Antidepressants - SSRI Passed - 06/10/2023  4:29 PM      Passed - Completed PHQ-2 or PHQ-9 in the last 360 days      Passed - Valid encounter within last 6 months    Recent Outpatient Visits           Yesterday Annual physical exam   [place] [facility], MD   3 months ago Essential hypertension   [place] [facility] at [facility], MD   5 months ago Essential hypertension   [place] [facility] at [facility], MD   9  months ago Essential hypertension   [place] [facility], MD   1 year ago Annual physical exam   [place] [facility], MD       Future Appointments             In 12 months Georganna Skeans, MD [facility]

## 2023-06-10 NOTE — Telephone Encounter (Signed)
Copied from CRM [id number]. Topic: Clinical - Medication Refill >> Jun 10, 2023 11:50 AM Phill Myron wrote: Most Recent Primary Care Visit:  Provider: Georganna Skeans  Department: [facility]  Visit Type: PHYSICAL  Date: 06/09/2023  Medication: omeprazole (PRILOSEC) 40 MG capsule FLUoxetine (PROZAC) 20 MG capsule  Has the patient contacted their pharmacy? Yes (Agent: If no, request that the patient contact the pharmacy for the refill. If patient does not wish to contact the pharmacy document the reason why and proceed with request.) (Agent: If yes, when and what did the pharmacy advise?)  Is this the correct pharmacy for this prescription? Yes If no, delete pharmacy and type the correct one.  This is the patient's preferred pharmacy:  [facility] - [place], [place] - [address] [place] [address] Phone: [phone] Fax: [phone]       Is the patient out of the medication? Yes  Has the patient been seen for an appointment in the last year OR does the patient have an upcoming appointment? Yes  Can we respond through MyChart? Yes  Agent: Please be advised that Rx refills may take up to 3 business days. We ask that you follow-up with your pharmacy.

## 2023-06-15 DIAGNOSIS — M4316 Spondylolisthesis, lumbar region: Secondary | ICD-10-CM | POA: Diagnosis not present

## 2023-06-15 DIAGNOSIS — M4726 Other spondylosis with radiculopathy, lumbar region: Secondary | ICD-10-CM | POA: Diagnosis not present

## 2023-06-17 ENCOUNTER — Other Ambulatory Visit: Payer: Self-pay | Admitting: Family Medicine

## 2023-06-18 ENCOUNTER — Encounter: Payer: Self-pay | Admitting: Adult Health

## 2023-06-18 ENCOUNTER — Ambulatory Visit: Payer: Medicare HMO | Admitting: Adult Health

## 2023-06-18 DIAGNOSIS — Z87891 Personal history of nicotine dependence: Secondary | ICD-10-CM | POA: Diagnosis not present

## 2023-06-18 NOTE — Progress Notes (Signed)
  Virtual Visit via Telephone Note  I connected with Evan [name] , 06/18/23 9:50 AM by a telemedicine application and verified that I am speaking with the correct person using two identifiers.  Location: Patient: home Provider: home   I discussed the limitations of evaluation and management by telemedicine and the availability of in person appointments. The patient expressed understanding and agreed to proceed.   Shared Decision Making Visit Lung Cancer Screening Program [phone])   Eligibility: 68 y.o. Pack Years Smoking History Calculation = 122 pack years  (# packs/per year x # years smoked) Recent History of coughing up blood  no Unexplained weight loss? no ( >Than 15 pounds within the last 6 months ) Prior History Lung / other cancer no (Diagnosis within the last 5 years already requiring surveillance chest CT Scans). Smoking Status Former Smoker Former Smokers: Years since quit: 4 years  Quit Date: 2021  Visit Components: Discussion included one or more decision making aids. YES Discussion included risk/benefits of screening. YES Discussion included potential follow up diagnostic testing for abnormal scans. YES Discussion included meaning and risk of over diagnosis. YES Discussion included meaning and risk of False Positives. YES Discussion included meaning of total radiation exposure. YES  Counseling Included: Importance of adherence to annual lung cancer LDCT screening. YES Impact of comorbidities on ability to participate in the program. YES Ability and willingness to under diagnostic treatment. YES  Smoking Cessation Counseling: Former Smokers:  Discussed the importance of maintaining cigarette abstinence. yes Diagnosis Code: Personal History of Nicotine Dependence. G95.621 Information about tobacco cessation classes and interventions provided to patient. Yes Patient provided with "ticket" for LDCT Scan. yes Written Order for Lung Cancer Screening with  LDCT placed in Epic. Yes (CT Chest Lung Cancer Screening Low Dose W/O CM) [id number]  Z12.2-Screening of respiratory organs Z87.891-Personal history of nicotine dependence   Danford Bad 06/18/23

## 2023-06-18 NOTE — Patient Instructions (Signed)

## 2023-06-23 ENCOUNTER — Ambulatory Visit
Admission: RE | Admit: 2023-06-23 | Discharge: 2023-06-23 | Disposition: A | Discharge status: Home / Self Care | Payer: Medicare HMO | Source: Ambulatory Visit | Attending: Family Medicine | Admitting: Family Medicine

## 2023-06-23 DIAGNOSIS — Z122 Encounter for screening for malignant neoplasm of respiratory organs: Secondary | ICD-10-CM

## 2023-06-23 DIAGNOSIS — Z87891 Personal history of nicotine dependence: Secondary | ICD-10-CM | POA: Diagnosis not present

## 2023-06-23 NOTE — Progress Notes (Incomplete)
Former smoker  100 pack year  Asymptomatic

## 2023-07-06 DIAGNOSIS — M545 Low back pain, unspecified: Secondary | ICD-10-CM | POA: Diagnosis not present

## 2023-07-06 DIAGNOSIS — G894 Chronic pain syndrome: Secondary | ICD-10-CM | POA: Diagnosis not present

## 2023-07-06 DIAGNOSIS — M961 Postlaminectomy syndrome, not elsewhere classified: Secondary | ICD-10-CM | POA: Diagnosis not present

## 2023-07-06 DIAGNOSIS — Z79891 Long term (current) use of opiate analgesic: Secondary | ICD-10-CM | POA: Diagnosis not present

## 2023-07-19 ENCOUNTER — Other Ambulatory Visit: Payer: Self-pay

## 2023-07-19 DIAGNOSIS — Z122 Encounter for screening for malignant neoplasm of respiratory organs: Secondary | ICD-10-CM

## 2023-07-19 DIAGNOSIS — Z87891 Personal history of nicotine dependence: Secondary | ICD-10-CM

## 2023-08-02 ENCOUNTER — Other Ambulatory Visit: Payer: Self-pay | Admitting: Family Medicine

## 2023-08-03 DIAGNOSIS — G894 Chronic pain syndrome: Secondary | ICD-10-CM | POA: Diagnosis not present

## 2023-08-03 DIAGNOSIS — Z79891 Long term (current) use of opiate analgesic: Secondary | ICD-10-CM | POA: Diagnosis not present

## 2023-08-03 DIAGNOSIS — M961 Postlaminectomy syndrome, not elsewhere classified: Secondary | ICD-10-CM | POA: Diagnosis not present

## 2023-08-03 DIAGNOSIS — M545 Low back pain, unspecified: Secondary | ICD-10-CM | POA: Diagnosis not present

## 2023-08-03 NOTE — Telephone Encounter (Signed)
 Requested Prescriptions  Pending Prescriptions Disp Refills   amLODipine (NORVASC) 10 MG tablet [Pharmacy Med Name: amlodipine 10 mg tablet] 90 tablet 1    Sig: TAKE 1 TABLET BY MOUTH DAILY     Cardiovascular: Calcium Channel Blockers 2 Failed - 08/03/2023  3:30 PM      Failed - Last BP in normal range    BP Readings from Last 1 Encounters:  06/09/23 (!) 153/86         Passed - Last Heart Rate in normal range    Pulse Readings from Last 1 Encounters:  06/09/23 74         Passed - Valid encounter within last 6 months    Recent Outpatient Visits           1 month ago Annual physical exam   [place] [facility], MD   4 months ago Essential hypertension   [place] [facility], MD   7 months ago Essential hypertension   [place] [facility], MD   10 months ago Essential hypertension   [place] [facility], MD   1 year ago Annual physical exam   [place] [facility], MD       Future Appointments             In 10 months Georganna Skeans, MD [facility] at [facility]

## 2023-08-19 ENCOUNTER — Ambulatory Visit: Payer: Medicare HMO

## 2023-08-19 VITALS — Ht 68.0 in | Wt 254.0 lb

## 2023-08-19 DIAGNOSIS — Z0000 Encounter for general adult medical examination without abnormal findings: Secondary | ICD-10-CM | POA: Diagnosis not present

## 2023-08-19 NOTE — Progress Notes (Signed)
 Subjective:   Evan [name] is a 68 y.o. who presents for a Medicare Wellness preventive visit.  Visit Complete: Virtual I connected with  Evan [name] on 08/19/23 by a audio enabled telemedicine application and verified that I am speaking with the correct person using two identifiers.  Patient Location: Home  Provider Location: Office/Clinic  I discussed the limitations of evaluation and management by telemedicine. The patient expressed understanding and agreed to proceed.  Vital Signs: Because this visit was a virtual/telehealth visit, some criteria may be missing or patient reported. Any vitals not documented were not able to be obtained and vitals that have been documented are patient reported.  VideoDeclined- This patient declined Librarian, academic. Therefore the visit was completed with audio only.  Persons Participating in Visit: Patient.  AWV Questionnaire: No: Patient Medicare AWV questionnaire was not completed prior to this visit.  Cardiac Risk Factors include: hypertension;male gender;advanced age (>63men, >33 women);obesity (BMI >30kg/m2);dyslipidemia     Objective:    Today's Vitals   08/19/23 0911  Weight: 254 lb (115.2 kg)  Height: 5\' 8"  (1.727 m)  PainSc: 3    Body mass index is 38.62 kg/m.     08/19/2023    9:18 AM 07/15/2022    7:08 AM 07/10/2022    9:21 AM 05/20/2022   11:47 AM 01/05/2022   10:49 PM 10/03/2019    1:48 PM 07/06/2019    8:00 AM  Advanced Directives  Does Patient Have a Medical Advance Directive? No No No No No No No  Would patient like information on creating a medical advance directive? No - Patient declined No - Patient declined Yes (MAU/Ambulatory/Procedural Areas - Information given)  No - Patient declined  No - Patient declined    Current Medications (verified) Outpatient Encounter Medications as of 08/19/2023  Medication Sig   albuterol  (VENTOLIN  HFA) 108 (90 Base) MCG/ACT inhaler Inhale  1-2 puffs into the lungs every 6 (six) hours as needed for wheezing or shortness of breath.   amLODipine  (NORVASC ) 10 MG tablet TAKE 1 TABLET BY MOUTH DAILY   aspirin  EC 81 MG tablet Take 1 tablet (81 mg total) by mouth daily.   ergocalciferol (VITAMIN D2) 1.25 MG (50000 UT) capsule Take 50,000 Units by mouth every 21 ( twenty-one) days.   fexofenadine (ALLEGRA) 180 MG tablet Take 180 mg by mouth daily as needed for allergies.   FLUoxetine  (PROZAC ) 20 MG capsule TAKE 1 CAPSULE BY MOUTH DAILY   gabapentin  (NEURONTIN ) 600 MG tablet Take 600 mg by mouth 2 (two) times daily.   HYDROcodone -acetaminophen  (NORCO) 7.5-325 MG tablet Take 1 tablet by mouth every 6 (six) hours as needed for moderate pain or severe pain.    losartan  (COZAAR ) 25 MG tablet TAKE 1 TABLET BY MOUTH AT BEDTIME   methocarbamol  (ROBAXIN -750) 750 MG tablet Take 1 tablet (750 mg total) by mouth 3 (three) times daily as needed for muscle spasms.   omeprazole  (PRILOSEC) 40 MG capsule TAKE 1 CAPSULE BY MOUTH DAILY   rosuvastatin  (CRESTOR ) 20 MG tablet Take 1 tablet (20 mg total) by mouth daily.   SHINGRIX injection    SYMBICORT 160-4.5 MCG/ACT inhaler INHALE 2 PUFFS TWICE DAILY AS DIRECTED   tamsulosin  (FLOMAX ) 0.4 MG CAPS capsule Take 1 capsule (0.4 mg total) by mouth daily.   ALPRAZolam  (XANAX ) 0.5 MG tablet Take 0.5 mg by mouth at bedtime as needed for anxiety.  (Patient not taking: Reported on 08/19/2023)   Semaglutide -Weight Management (WEGOVY ) 0.25 MG/0.5ML  SOAJ Inject 0.25 mg into the skin once a week. (Patient not taking: Reported on 08/19/2023)   No facility-administered encounter medications on file as of 08/19/2023.    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   Osteoporosis    Right-sided low back pain with right-sided sciatica    Seizures (HCC)    no seizures in many years   Shortness of breath dyspnea    with  exertion   Sleep apnea    last sleep study more than 5 years  uses CPAP even when napping   Past Surgical History:  Procedure Laterality Date   FRACTURE SURGERY     JOINT REPLACEMENT     kidney stones     Left leg     Rod placed   NECK SURGERY     4-5--6 and 7   SPINE SURGERY     TOTAL HIP ARTHROPLASTY Right 07/09/2015   Procedure: RIGHT TOTAL HIP ARTHROPLASTY ANTERIOR APPROACH;  Surgeon: Arnie Lao, MD;  Location: [facility] OR;  Service: Orthopedics;  Laterality: Right;   Family History  Problem Relation Age of Onset   Hepatitis Mother    Alcohol abuse Mother    Depression Mother    Alzheimer's disease Father    COPD Father    Drug abuse Brother    Social History   Socioeconomic History   Marital status: Significant Other    Spouse name: Not on file   Number of children: 2   Years of education: 8   Highest education level: 12th grade  Occupational History   Not on file  Tobacco Use   Smoking status: Former    Current packs/day: 0.00    Average packs/day: 2.0 packs/day for 45.0 years (90.0 ttl pk-yrs)    Types: Cigarettes    Start date: 12/15/1972    Quit date: 12/15/2017    Years since quitting: 5.6   Smokeless tobacco: Never  Vaping Use   Vaping status: Never Used  Substance and Sexual Activity   Alcohol use: Yes    Alcohol/week: 7.0 standard drinks of alcohol    Types: 7 Shots of liquor per week    Comment: Between supper and bed time   Drug use: Yes    Types: Hydrocodone     Comment: has perscription   Sexual activity: Yes  Other Topics  Concern   Not on file  Social History Narrative   Patient is not working he is trying to get disability   Patient is single    Education 12 th grade    Right handed.   Caffeine three cups daily.            Social Drivers of Corporate investment banker Strain: Low Risk  (08/19/2023)   Overall Financial Resource Strain (CARDIA)    Difficulty of Paying Living Expenses: Not hard at all  Recent Concern: Financial Resource Strain - Medium Risk (06/05/2023)   Overall Financial Resource Strain (CARDIA)    Difficulty of Paying Living Expenses: Somewhat hard  Food Insecurity: No Food Insecurity (08/19/2023)   Hunger Vital Sign    Worried About Running Out of Food in the Last Year: Never true    Ran Out of Food in the Last Year: Never true  Transportation Needs: No Transportation Needs (08/19/2023)   PRAPARE - Administrator, Civil Service (Medical): No    Lack of Transportation (Non-Medical): No  Physical Activity: Inactive (08/19/2023)   Exercise Vital Sign    Days of Exercise per Week: 0 days    Minutes of Exercise per Session: 0 min  Stress: No Stress Concern Present (08/19/2023)   [employer] of Occupational Health - Occupational Stress Questionnaire    Feeling of Stress : Only a little  Recent Concern: Stress - Stress Concern Present (06/05/2023)   [employer] of Occupational Health - Occupational Stress Questionnaire    Feeling of Stress : To some extent  Social Connections: Socially Isolated (08/19/2023)   Social Connection and Isolation Panel [NHANES]    Frequency of Communication with Friends and Family: More than three times a week    Frequency of Social Gatherings with Friends and Family: More than three times a week    Attends Religious Services: Never    Database administrator or Organizations: No    Attends Engineer, structural: Never    Marital Status: Never married    Tobacco Counseling Counseling given: Not Answered    Clinical  Intake:  Pre-visit preparation completed: Yes  Pain : 0-10 Pain Score: 3  Pain Type: Chronic pain Pain Location: Back Pain Orientation: Lower Pain Descriptors / Indicators: Aching Pain Onset: More than a month ago Pain Frequency: Intermittent     BMI - recorded: 38.62 Nutritional Status: BMI > 30  Obese Diabetes: No  Lab Results  Component Value Date   HGBA1C 6.2 (A) 03/10/2023   HGBA1C 6.2 (H) 11/13/2021     How often do you need to have someone help you when you read instructions, pamphlets, or other written materials from your doctor or pharmacy?: 1 - Never  Interpreter Needed?: No  Information entered by :: Lamont Pilsner, LPN   Activities of Daily Living      08/19/2023    9:13 AM  In your present state of health, do you have any difficulty performing the following activities:  Hearing? 0  Vision? 0  Difficulty concentrating or making decisions? 0  Walking or climbing stairs? 1  Comment use a cane  Dressing or bathing? 1  Comment still manage  Doing errands, shopping? 0  Preparing Food and eating ? Y  Comment still manage  Using the Toilet? N  In the past six months, have you accidently leaked urine? N  Do you have problems with loss of bowel control? N  Managing your Medications? N  Managing your Finances? N  Housekeeping or managing your Housekeeping? Y  Comment back pain    Patient Care Team: Abraham Abo, MD as PCP - General (Family Medicine) Arty Binning, MD (Inactive) as PCP - Cardiology (Cardiology)  Indicate any recent Medical Services you may have received from other than [facility] providers in the past year (date may be approximate).     Assessment:   This is a routine wellness examination for Evan [name].  Hearing/Vision screen Hearing Screening - Comments:: Pt denies any hearing issues  Vision Screening - Comments:: Encouraged to follow up with eye provider     Goals Addressed             This Visit's Progress    Patient Stated        Lose weight  Each day, aim for 6 glasses of water, plenty of protein in your diet and try to get up and walk/ stretch every hour for 5-10 minutes at a time.         Depression Screen      08/19/2023    9:19 AM 06/09/2023   10:01 AM 03/10/2023   10:54 AM 01/04/2023    2:28 PM 06/08/2022   10:10 AM 05/20/2022   11:48 AM 03/03/2022    8:26 AM  PHQ 2/9 Scores  PHQ - 2 Score 1 0 0 2 0 0 2  PHQ- 9 Score    8 0  12    Fall Risk      08/19/2023    9:22 AM 06/09/2023   10:01 AM 03/10/2023   10:55 AM 01/04/2023    2:30 PM 05/20/2022   11:47 AM  Fall Risk   Falls in the past year? 0 0 0  1  Comment     trips of over dog sometimes  Number falls in past yr: 0 0 0 0 0  Injury with Fall? 0 0 0 0 0  Risk for fall due to : Impaired balance/gait;Impaired mobility No Fall Risks No Fall Risks  Impaired mobility;Medication side effect  Follow up Falls prevention discussed Falls evaluation completed Falls evaluation completed  Falls evaluation completed;Education provided;Falls prevention discussed    MEDICARE RISK AT HOME:   Medicare Risk at Home Any stairs in or around the home?: Yes If so, are there any without handrails?: No Home free of loose throw rugs in walkways, pet beds, electrical cords, etc?: Yes Adequate lighting in your home to reduce risk of falls?: Yes Life alert?: No Use of a cane, walker or w/c?: Yes Grab bars in the bathroom?: No Shower chair or bench in shower?: No Elevated toilet seat or a handicapped toilet?: No  TIMED UP AND GO:  Was the test performed?  No  Cognitive Function: 6CIT completed        08/19/2023    9:22 AM 05/20/2022   11:50 AM  6CIT Screen  What Year? 0 points 0 points  What month? 0 points 0 points  What time? 0 points 0 points  Count back from 20 0 points 0 points  Months in reverse 0 points 0 points  Repeat phrase 0 points 2 points  Total Score 0 points 2 points    Immunizations Immunization History  Administered Date(s)  Administered   Fluad Quad(high Dose 65+) 02/06/2022   Fluad Trivalent(High Dose 65+) 03/10/2023   Influenza Inj Mdck Quad Pf 01/31/2020   Influenza Inj Mdck Quad With Preservative 02/27/2021   Influenza, Seasonal, Injecte, Preservative Fre 04/27/2010   Influenza,inj,quad, With Preservative 02/01/2018, 03/17/2019   Pneumococcal Conjugate-13 05/06/2017   Pneumococcal Polysaccharide-23 03/03/2022   Tdap 04/28/2003   Zoster Recombinant(Shingrix) 02/25/2023, 04/29/2023    Screening Tests Health Maintenance  Topic Date Due   INFLUENZA VACCINE  11/26/2023   Lung Cancer Screening  06/22/2024   Medicare Annual Wellness (AWV)  08/18/2024   Colonoscopy  12/27/2030   Pneumonia Vaccine 81+ Years old  Completed   Hepatitis C Screening  Completed   Zoster Vaccines- Shingrix  Completed   HPV VACCINES  Aged Out  Meningococcal B Vaccine  Aged Out   DTaP/Tdap/Td  Discontinued   COVID-19 Vaccine  Discontinued    Health Maintenance  There are no preventive care reminders to display for this patient.  Health Maintenance Items Addressed: See Nurse Notes  Additional Screening:  Vision Screening: Recommended annual ophthalmology exams for early detection of glaucoma and other disorders of the eye.  Dental Screening: Recommended annual dental exams for proper oral hygiene  Community Resource Referral / Chronic Care Management: CRR required this visit?  No   CCM required this visit?  No     Plan:     I have personally reviewed and noted the following in the patient's chart:   Medical and social history Use of alcohol, tobacco or illicit drugs  Current medications and supplements including opioid prescriptions. Patient is currently taking opioid prescriptions. Information provided to patient regarding non-opioid alternatives. Patient advised to discuss non-opioid treatment plan with their provider. Functional ability and status Nutritional status Physical activity Advanced  directives List of other physicians Hospitalizations, surgeries, and ER visits in previous 12 months Vitals Screenings to include cognitive, depression, and falls Referrals and appointments  In addition, I have reviewed and discussed with patient certain preventive protocols, quality metrics, and best practice recommendations. A written personalized care plan for preventive services as well as general preventive health recommendations were provided to patient.     Bruno Capri, LPN   1/61/0960   After Visit Summary: (MyChart) Due to this being a telephonic visit, the after visit summary with patients personalized plan was offered to patient via MyChart   Notes: Nothing significant to report at this time.

## 2023-08-19 NOTE — Patient Instructions (Signed)
 Mr. Evan [name] , Thank you for taking time to come for your Medicare Wellness Visit. I appreciate your ongoing commitment to your health goals. Please review the following plan we discussed and let me know if I can assist you in the future.   Referrals/Orders/Follow-Ups/Clinician Recommendations: Each day, aim for 6 glasses of water, plenty of protein in your diet and try to get up and walk/ stretch every hour for 5-10 minutes at a time.    This is a list of the screening recommended for you and due dates:  Health Maintenance  Topic Date Due   Medicare Annual Wellness Visit  06/09/2023   Flu Shot  11/26/2023   Screening for Lung Cancer  06/22/2024   Colon Cancer Screening  12/27/2030   Pneumonia Vaccine  Completed   Hepatitis C Screening  Completed   Zoster (Shingles) Vaccine  Completed   HPV Vaccine  Aged Out   Meningitis B Vaccine  Aged Out   DTaP/Tdap/Td vaccine  Discontinued   COVID-19 Vaccine  Discontinued    Advanced directives: (Declined) Advance directive discussed with you today. Even though you declined this today, please call our office should you change your mind, and we can give you the proper paperwork for you to fill out.  Next Medicare Annual Wellness Visit scheduled for next year: Yes

## 2023-08-31 DIAGNOSIS — G894 Chronic pain syndrome: Secondary | ICD-10-CM | POA: Diagnosis not present

## 2023-08-31 DIAGNOSIS — M545 Low back pain, unspecified: Secondary | ICD-10-CM | POA: Diagnosis not present

## 2023-08-31 DIAGNOSIS — M961 Postlaminectomy syndrome, not elsewhere classified: Secondary | ICD-10-CM | POA: Diagnosis not present

## 2023-08-31 DIAGNOSIS — Z79891 Long term (current) use of opiate analgesic: Secondary | ICD-10-CM | POA: Diagnosis not present

## 2023-09-04 DIAGNOSIS — G4733 Obstructive sleep apnea (adult) (pediatric): Secondary | ICD-10-CM | POA: Diagnosis not present

## 2023-09-07 ENCOUNTER — Other Ambulatory Visit: Payer: Self-pay | Admitting: Family Medicine

## 2023-09-14 ENCOUNTER — Other Ambulatory Visit: Payer: Self-pay | Admitting: Family Medicine

## 2023-09-14 NOTE — Telephone Encounter (Signed)
 Copied from CRM [id number]. Topic: Clinical - Medication Refill >> Sep 14, 2023 11:53 AM Corin V wrote: Medication: FLUoxetine  (PROZAC ) 20 MG capsule, tamsulosin  (FLOMAX ) 0.4 MG CAPS capsule  Has the patient contacted their pharmacy? Yes (Agent: If no, request that the patient contact the pharmacy for the refill. If patient does not wish to contact the pharmacy document the reason why and proceed with request.) (Agent: If yes, when and what did the pharmacy advise?)  This is the patient's preferred pharmacy:  [facility] - [place], [place] - [address] [place] [address] Phone: [phone] Fax: [phone]   Is this the correct pharmacy for this prescription? Yes If no, delete pharmacy and type the correct one.   Has the prescription been filled recently? No  Is the patient out of the medication? No  Has the patient been seen for an appointment in the last year OR does the patient have an upcoming appointment? Yes  Can we respond through MyChart? Yes  Agent: Please be advised that Rx refills may take up to 3 business days. We ask that you follow-up with your pharmacy.

## 2023-09-16 MED ORDER — FLUOXETINE HCL 20 MG PO CAPS: 20.0000 mg | ORAL_CAPSULE | Freq: Every day | ORAL | 0 refills | Status: DC

## 2023-09-16 NOTE — Telephone Encounter (Signed)
 Requested Prescriptions  Pending Prescriptions Disp Refills   FLUoxetine  (PROZAC ) 20 MG capsule 90 capsule 0    Sig: Take 1 capsule (20 mg total) by mouth daily.     Psychiatry:  Antidepressants - SSRI Passed - 09/16/2023  8:25 AM      Passed - Completed PHQ-2 or PHQ-9 in the last 360 days      Passed - Valid encounter within last 6 months    Recent Outpatient Visits           3 months ago Annual physical exam   [place] [facility] at [facility], MD   6 months ago Essential hypertension   [place] [facility], MD   8 months ago Essential hypertension   [place] [facility] at [facility], MD   1 year ago Essential hypertension   [place] [facility] at [facility], MD   1 year ago Annual physical exam   [place] [facility], MD       Future Appointments             In 8 months Abraham Abo, MD [facility] at [facility]

## 2023-09-29 DIAGNOSIS — G894 Chronic pain syndrome: Secondary | ICD-10-CM | POA: Diagnosis not present

## 2023-09-29 DIAGNOSIS — M961 Postlaminectomy syndrome, not elsewhere classified: Secondary | ICD-10-CM | POA: Diagnosis not present

## 2023-09-29 DIAGNOSIS — M545 Low back pain, unspecified: Secondary | ICD-10-CM | POA: Diagnosis not present

## 2023-09-29 DIAGNOSIS — Z79891 Long term (current) use of opiate analgesic: Secondary | ICD-10-CM | POA: Diagnosis not present

## 2023-10-06 ENCOUNTER — Other Ambulatory Visit: Payer: Self-pay | Admitting: Family Medicine

## 2023-11-03 DIAGNOSIS — M961 Postlaminectomy syndrome, not elsewhere classified: Secondary | ICD-10-CM | POA: Diagnosis not present

## 2023-11-03 DIAGNOSIS — M545 Low back pain, unspecified: Secondary | ICD-10-CM | POA: Diagnosis not present

## 2023-11-03 DIAGNOSIS — Z79891 Long term (current) use of opiate analgesic: Secondary | ICD-10-CM | POA: Diagnosis not present

## 2023-11-03 DIAGNOSIS — G894 Chronic pain syndrome: Secondary | ICD-10-CM | POA: Diagnosis not present

## 2023-11-18 ENCOUNTER — Other Ambulatory Visit: Payer: Self-pay | Admitting: Family Medicine

## 2023-12-02 DIAGNOSIS — M961 Postlaminectomy syndrome, not elsewhere classified: Secondary | ICD-10-CM | POA: Diagnosis not present

## 2023-12-02 DIAGNOSIS — M545 Low back pain, unspecified: Secondary | ICD-10-CM | POA: Diagnosis not present

## 2023-12-02 DIAGNOSIS — Z79891 Long term (current) use of opiate analgesic: Secondary | ICD-10-CM | POA: Diagnosis not present

## 2023-12-02 DIAGNOSIS — G894 Chronic pain syndrome: Secondary | ICD-10-CM | POA: Diagnosis not present

## 2023-12-07 DIAGNOSIS — G4733 Obstructive sleep apnea (adult) (pediatric): Secondary | ICD-10-CM | POA: Diagnosis not present

## 2023-12-14 ENCOUNTER — Other Ambulatory Visit: Payer: Self-pay | Admitting: Family Medicine

## 2023-12-17 ENCOUNTER — Other Ambulatory Visit: Payer: Self-pay | Admitting: Family Medicine

## 2023-12-17 ENCOUNTER — Telehealth: Payer: Self-pay

## 2023-12-17 MED ORDER — FLUOXETINE HCL 20 MG PO CAPS: 20.0000 mg | ORAL_CAPSULE | Freq: Every day | ORAL | 0 refills | Status: DC

## 2023-12-17 MED ORDER — OMEPRAZOLE 40 MG PO CPDR: 40.0000 mg | DELAYED_RELEASE_CAPSULE | Freq: Every day | ORAL | 0 refills | Status: DC

## 2023-12-17 NOTE — Telephone Encounter (Signed)
 Copied from CRM #[id number]. Topic: Clinical - Prescription Issue >> Dec 17, 2023 10:30 AM Delon DASEN wrote: Reason for CRM: omeprazole  (PRILOSEC) 40 MG capsule FLUoxetine  (PROZAC ) 20 MG capsule Checking status of refills

## 2023-12-17 NOTE — Telephone Encounter (Signed)
 error

## 2023-12-21 ENCOUNTER — Other Ambulatory Visit: Payer: Self-pay | Admitting: Family Medicine

## 2024-01-04 DIAGNOSIS — M545 Low back pain, unspecified: Secondary | ICD-10-CM | POA: Diagnosis not present

## 2024-01-04 DIAGNOSIS — Z79891 Long term (current) use of opiate analgesic: Secondary | ICD-10-CM | POA: Diagnosis not present

## 2024-01-04 DIAGNOSIS — G894 Chronic pain syndrome: Secondary | ICD-10-CM | POA: Diagnosis not present

## 2024-01-04 DIAGNOSIS — M961 Postlaminectomy syndrome, not elsewhere classified: Secondary | ICD-10-CM | POA: Diagnosis not present

## 2024-01-24 ENCOUNTER — Other Ambulatory Visit: Payer: Self-pay | Admitting: Family Medicine

## 2024-01-24 NOTE — Telephone Encounter (Signed)
 I schedule patient for provider's next available appt on 11/17

## 2024-02-01 DIAGNOSIS — M961 Postlaminectomy syndrome, not elsewhere classified: Secondary | ICD-10-CM | POA: Diagnosis not present

## 2024-02-01 DIAGNOSIS — G894 Chronic pain syndrome: Secondary | ICD-10-CM | POA: Diagnosis not present

## 2024-02-01 DIAGNOSIS — M545 Low back pain, unspecified: Secondary | ICD-10-CM | POA: Diagnosis not present

## 2024-02-01 DIAGNOSIS — Z79891 Long term (current) use of opiate analgesic: Secondary | ICD-10-CM | POA: Diagnosis not present

## 2024-02-10 NOTE — Progress Notes (Signed)
 Evan [name]                                          MRN: [id number]   02/10/2024   The VBCI Quality Team Specialist reviewed this patient medical record for the purposes of chart review for care gap closure. The following were reviewed: chart review for care gap closure-controlling blood pressure.    VBCI Quality Team

## 2024-02-29 DIAGNOSIS — M545 Low back pain, unspecified: Secondary | ICD-10-CM | POA: Diagnosis not present

## 2024-02-29 DIAGNOSIS — G894 Chronic pain syndrome: Secondary | ICD-10-CM | POA: Diagnosis not present

## 2024-02-29 DIAGNOSIS — M961 Postlaminectomy syndrome, not elsewhere classified: Secondary | ICD-10-CM | POA: Diagnosis not present

## 2024-02-29 DIAGNOSIS — Z79891 Long term (current) use of opiate analgesic: Secondary | ICD-10-CM | POA: Diagnosis not present

## 2024-03-13 ENCOUNTER — Other Ambulatory Visit: Payer: Self-pay | Admitting: Family Medicine

## 2024-03-13 ENCOUNTER — Encounter: Payer: Self-pay | Admitting: Family Medicine

## 2024-03-13 ENCOUNTER — Ambulatory Visit (INDEPENDENT_AMBULATORY_CARE_PROVIDER_SITE_OTHER): Admitting: Family Medicine

## 2024-03-13 VITALS — BP 139/80 | HR 78 | Ht 68.0 in | Wt 254.6 lb

## 2024-03-13 DIAGNOSIS — E785 Hyperlipidemia, unspecified: Secondary | ICD-10-CM

## 2024-03-13 DIAGNOSIS — E66812 Obesity, class 2: Secondary | ICD-10-CM | POA: Diagnosis not present

## 2024-03-13 DIAGNOSIS — Z23 Encounter for immunization: Secondary | ICD-10-CM | POA: Diagnosis not present

## 2024-03-13 DIAGNOSIS — R7303 Prediabetes: Secondary | ICD-10-CM | POA: Diagnosis not present

## 2024-03-13 DIAGNOSIS — I10 Essential (primary) hypertension: Secondary | ICD-10-CM

## 2024-03-13 DIAGNOSIS — G4733 Obstructive sleep apnea (adult) (pediatric): Secondary | ICD-10-CM

## 2024-03-13 DIAGNOSIS — Z6838 Body mass index (BMI) 38.0-38.9, adult: Secondary | ICD-10-CM | POA: Diagnosis not present

## 2024-03-13 DIAGNOSIS — R21 Rash and other nonspecific skin eruption: Secondary | ICD-10-CM

## 2024-03-13 MED ORDER — TRIAMCINOLONE ACETONIDE 0.1 % EX CREA: 1.0000 | TOPICAL_CREAM | Freq: Two times a day (BID) | CUTANEOUS | 0 refills | Status: AC

## 2024-03-13 MED ORDER — LOSARTAN POTASSIUM 25 MG PO TABS: 25.0000 mg | ORAL_TABLET | Freq: Every day | ORAL | 1 refills | Status: AC

## 2024-03-13 MED ORDER — ROSUVASTATIN CALCIUM 20 MG PO TABS: 20.0000 mg | ORAL_TABLET | Freq: Every day | ORAL | 1 refills | Status: AC

## 2024-03-13 MED ORDER — FLUOXETINE HCL 20 MG PO CAPS: 20.0000 mg | ORAL_CAPSULE | Freq: Every day | ORAL | 1 refills | Status: AC

## 2024-03-13 MED ORDER — AMLODIPINE BESYLATE 10 MG PO TABS: 10.0000 mg | ORAL_TABLET | Freq: Every day | ORAL | 1 refills | Status: AC

## 2024-03-13 MED ORDER — CETIRIZINE HCL 10 MG PO TABS
10.0000 mg | ORAL_TABLET | Freq: Every day | ORAL | 11 refills | Status: AC
Start: 2024-03-13 — End: ?

## 2024-03-14 NOTE — Progress Notes (Unsigned)
 Established Patient Office Visit  Subjective    Patient ID: Evan [name], male    DOB: 08/12/1955  Age: 68 y.o. MRN: [id number]  CC:  Chief Complaint  Patient presents with   Medical Management of Chronic Issues    HPI [name] presents for routine follow up of hypertension. Patient reports med compliance. Patient also reports intermittent itchy rash and reports he has had eczema in the past.   Outpatient Encounter Medications as of 03/13/2024  Medication Sig   albuterol  (VENTOLIN  HFA) 108 (90 Base) MCG/ACT inhaler Inhale 1-2 puffs into the lungs every 6 (six) hours as needed for wheezing or shortness of breath.   aspirin  EC 81 MG tablet Take 1 tablet (81 mg total) by mouth daily.   cetirizine (ZYRTEC) 10 MG tablet Take 1 tablet (10 mg total) by mouth daily.   fexofenadine (ALLEGRA) 180 MG tablet Take 180 mg by mouth daily as needed for allergies.   gabapentin  (NEURONTIN ) 600 MG tablet Take 600 mg by mouth 2 (two) times daily.   HYDROcodone -acetaminophen  (NORCO) 7.5-325 MG tablet Take 1 tablet by mouth every 6 (six) hours as needed for moderate pain or severe pain.    methocarbamol  (ROBAXIN -750) 750 MG tablet Take 1 tablet (750 mg total) by mouth 3 (three) times daily as needed for muscle spasms.   omeprazole  (PRILOSEC) 40 MG capsule Take 1 capsule (40 mg total) by mouth daily.   SYMBICORT 160-4.5 MCG/ACT inhaler INHALE 2 PUFFS TWICE DAILY AS DIRECTED   tamsulosin  (FLOMAX ) 0.4 MG CAPS capsule TAKE 1 CAPSULE BY MOUTH DAILY   triamcinolone cream (KENALOG) 0.1 % Apply 1 Application topically 2 (two) times daily.   [DISCONTINUED] amLODipine  (NORVASC ) 10 MG tablet TAKE 1 TABLET BY MOUTH DAILY   [DISCONTINUED] FLUoxetine  (PROZAC ) 20 MG capsule Take 1 capsule (20 mg total) by mouth daily.   [DISCONTINUED] losartan  (COZAAR ) 25 MG tablet TAKE 1 TABLET BY MOUTH AT BEDTIME   [DISCONTINUED] rosuvastatin  (CRESTOR ) 20 MG tablet TAKE 1 TABLET BY MOUTH DAILY   ALPRAZolam  (XANAX )  0.5 MG tablet Take 0.5 mg by mouth at bedtime as needed for anxiety.  (Patient not taking: Reported on 08/19/2023)   amLODipine  (NORVASC ) 10 MG tablet Take 1 tablet (10 mg total) by mouth daily.   ergocalciferol (VITAMIN D2) 1.25 MG (50000 UT) capsule Take 50,000 Units by mouth every 21 ( twenty-one) days. (Patient not taking: Reported on 03/13/2024)   FLUoxetine  (PROZAC ) 20 MG capsule Take 1 capsule (20 mg total) by mouth daily.   losartan  (COZAAR ) 25 MG tablet Take 1 tablet (25 mg total) by mouth at bedtime.   rosuvastatin  (CRESTOR ) 20 MG tablet Take 1 tablet (20 mg total) by mouth daily.   SHINGRIX injection    [DISCONTINUED] Semaglutide -Weight Management (WEGOVY ) 0.25 MG/0.5ML SOAJ Inject 0.25 mg into the skin once a week. (Patient not taking: Reported on 08/19/2023)   No facility-administered encounter medications on file as of 03/13/2024.    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   Osteoporosis    Right-sided low back pain with right-sided sciatica    Seizures (HCC)    no seizures in many years   Shortness of breath dyspnea    with  exertion   Sleep apnea    last sleep study more than 5 years  uses CPAP even when napping    Past Surgical History:  Procedure Laterality Date   FRACTURE SURGERY     JOINT REPLACEMENT     kidney stones     Left leg     Rod placed   NECK SURGERY     4-5--6 and 7   SPINE SURGERY     TOTAL HIP ARTHROPLASTY Right 07/09/2015   Procedure: RIGHT TOTAL HIP ARTHROPLASTY ANTERIOR APPROACH;  Surgeon: Lonni CINDERELLA Poli, MD;  Location: [facility] OR;  Service: Orthopedics;  Laterality: Right;    Family History  Problem Relation Age of Onset   Hepatitis Mother    Alcohol abuse Mother    Depression Mother    Alzheimer's disease Father    COPD Father    Drug abuse Brother     Social History   Socioeconomic History   Marital status: Significant Other    Spouse name: Not on file   Number of children: 2   Years of education: 42   Highest education level: 12th grade  Occupational History   Not on file  Tobacco Use   Smoking status: Former    Current packs/day: 0.00    Average packs/day: 2.0 packs/day for 45.0 years (90.0 ttl pk-yrs)    Types: Cigarettes    Start date: 12/15/1972    Quit date: 12/15/2017    Years since quitting: 6.2   Smokeless tobacco: Never  Vaping Use   Vaping status: Never Used  Substance and Sexual Activity   Alcohol use: Yes    Alcohol/week: 7.0 standard drinks of alcohol    Types: 7 Shots of liquor per week    Comment: Between supper and bed time   Drug use: Yes    Types: Hydrocodone     Comment: has perscription   Sexual activity: Yes  Other Topics Concern   Not on file  Social History Narrative   Patient is not working he is trying to get disability   Patient is single    Education 12 th grade    Right handed.   Caffeine three cups daily.            Social Drivers of Health   Financial Resource Strain: Medium Risk (03/09/2024)   Overall Financial Resource Strain (CARDIA)    Difficulty of Paying Living Expenses: Somewhat hard  Food Insecurity: Patient Declined (03/09/2024)   Hunger Vital Sign    Worried About Running Out of Food in the Last Year: Patient declined    Ran Out of Food in the Last Year: Patient declined  Transportation Needs: No Transportation Needs (03/09/2024)   PRAPARE - Administrator, Civil Service (Medical): No    Lack of Transportation (Non-Medical): No  Physical Activity: Inactive (03/09/2024)   Exercise Vital Sign     Days of Exercise per Week: 0 days    Minutes of Exercise per Session: Not on file  Stress: Stress Concern Present (03/09/2024)   [employer] of Occupational Health - Occupational Stress Questionnaire    Feeling of Stress: To some extent  Social Connections: Socially Isolated (03/09/2024)   Social Connection and Isolation Panel  Frequency of Communication with Friends and Family: More than three times a week    Frequency of Social Gatherings with Friends and Family: Twice a week    Attends Religious Services: Patient declined    Database Administrator or Organizations: No    Attends Engineer, Structural: Not on file    Marital Status: Divorced  Intimate Partner Violence: Not At Risk (08/19/2023)   Humiliation, Afraid, Rape, and Kick questionnaire    Fear of Current or Ex-Partner: No    Emotionally Abused: No    Physically Abused: No    Sexually Abused: No    Review of Systems  All other systems reviewed and are negative.       Objective    BP 139/80   Pulse 78   Ht 5' 8 (1.727 m)   Wt 254 lb 9.6 oz (115.5 kg)   SpO2 94%   BMI 38.71 kg/m   Physical Exam Vitals and nursing note reviewed.  Constitutional:      General: He is not in acute distress.    Appearance: He is obese.  Cardiovascular:     Rate and Rhythm: Normal rate and regular rhythm.  Pulmonary:     Effort: Pulmonary effort is normal.     Breath sounds: Normal breath sounds.  Abdominal:     Palpations: Abdomen is soft.     Tenderness: There is no abdominal tenderness.  Skin:    Findings: Rash present.  Neurological:     General: No focal deficit present.     Mental Status: He is alert and oriented to person, place, and time.         Assessment & Plan:   1. Essential hypertension (Primary) Appears stable. Continue   2. Hyperlipidemia, unspecified hyperlipidemia type Continue   3. Rash and nonspecific skin eruption Triamcinolone and zyrtec prescribed.   4.  Prediabetes   5. Obstructive sleep apnea syndrome Appears stable  6. Encounter for immunization  - Flu vaccine HIGH DOSE PF(Fluzone Trivalent)  7. Class 2 severe obesity due to excess calories with serious comorbidity and body mass index (BMI) of 38.0 to 38.9 in adult   No follow-ups on file.   Tanda Raguel SQUIBB, MD

## 2024-03-15 ENCOUNTER — Other Ambulatory Visit: Payer: Self-pay | Admitting: Family Medicine

## 2024-03-28 DIAGNOSIS — G894 Chronic pain syndrome: Secondary | ICD-10-CM | POA: Diagnosis not present

## 2024-03-28 DIAGNOSIS — M545 Low back pain, unspecified: Secondary | ICD-10-CM | POA: Diagnosis not present

## 2024-03-28 DIAGNOSIS — M961 Postlaminectomy syndrome, not elsewhere classified: Secondary | ICD-10-CM | POA: Diagnosis not present

## 2024-03-28 DIAGNOSIS — Z79891 Long term (current) use of opiate analgesic: Secondary | ICD-10-CM | POA: Diagnosis not present

## 2024-05-31 ENCOUNTER — Encounter: Payer: Self-pay | Admitting: *Deleted

## 2024-05-31 NOTE — Progress Notes (Signed)
 Jacen Carlini                                          MRN: [id number]   05/31/2024   The [facility] Quality Team Specialist reviewed this patient medical record for the purposes of chart review for care gap closure. The following were reviewed: abstraction for care gap closure-controlling blood pressure.    [facility] Quality Team

## 2024-06-08 ENCOUNTER — Encounter: Payer: Medicare HMO | Admitting: Family Medicine
